# Patient Record
Sex: Female | Born: 1945
Health system: Southern US, Community
[De-identification: ages and names within clinical notes are randomized; demographics above are authoritative.]

## PROBLEM LIST (undated history)

## (undated) DIAGNOSIS — Z87442 Personal history of urinary calculi: Secondary | ICD-10-CM

## (undated) DIAGNOSIS — I471 Supraventricular tachycardia, unspecified: Secondary | ICD-10-CM

## (undated) DIAGNOSIS — R42 Dizziness and giddiness: Secondary | ICD-10-CM

## (undated) DIAGNOSIS — I499 Cardiac arrhythmia, unspecified: Secondary | ICD-10-CM

## (undated) DIAGNOSIS — I491 Atrial premature depolarization: Secondary | ICD-10-CM

## (undated) DIAGNOSIS — N1831 Chronic kidney disease, stage 3a: Secondary | ICD-10-CM

## (undated) DIAGNOSIS — R002 Palpitations: Secondary | ICD-10-CM

## (undated) DIAGNOSIS — Z923 Personal history of irradiation: Secondary | ICD-10-CM

## (undated) DIAGNOSIS — F419 Anxiety disorder, unspecified: Secondary | ICD-10-CM

## (undated) DIAGNOSIS — G4700 Insomnia, unspecified: Secondary | ICD-10-CM

## (undated) DIAGNOSIS — C50919 Malignant neoplasm of unspecified site of unspecified female breast: Secondary | ICD-10-CM

## (undated) DIAGNOSIS — N200 Calculus of kidney: Secondary | ICD-10-CM

## (undated) HISTORY — DX: Palpitations: R00.2

## (undated) HISTORY — DX: Supraventricular tachycardia, unspecified: I47.10

## (undated) HISTORY — DX: Anxiety disorder, unspecified: F41.9

## (undated) HISTORY — DX: Atrial premature depolarization: I49.1

## (undated) HISTORY — DX: Calculus of kidney: N20.0

## (undated) HISTORY — PX: EYE SURGERY: SHX253

## (undated) HISTORY — DX: Supraventricular tachycardia: I47.1

## (undated) HISTORY — PX: LITHOTRIPSY: SUR834

## (undated) HISTORY — PX: BREAST CYST ASPIRATION: SHX578

## (undated) HISTORY — DX: Insomnia, unspecified: G47.00

## (undated) HISTORY — PX: BREAST LUMPECTOMY: SHX2

## (undated) HISTORY — PX: BUNIONECTOMY: SHX129

---

## 1971-04-03 HISTORY — PX: DILATION AND CURETTAGE OF UTERUS: SHX78

## 2003-04-21 ENCOUNTER — Other Ambulatory Visit: Payer: Self-pay

## 2004-06-12 ENCOUNTER — Ambulatory Visit: Payer: Self-pay | Admitting: Unknown Physician Specialty

## 2005-06-13 ENCOUNTER — Ambulatory Visit: Payer: Self-pay | Admitting: Unknown Physician Specialty

## 2006-02-23 ENCOUNTER — Emergency Department: Payer: Self-pay | Admitting: Emergency Medicine

## 2006-07-01 ENCOUNTER — Encounter: Payer: Self-pay | Admitting: Internal Medicine

## 2006-07-02 ENCOUNTER — Ambulatory Visit: Payer: Self-pay | Admitting: Unknown Physician Specialty

## 2007-07-15 ENCOUNTER — Ambulatory Visit: Payer: Self-pay | Admitting: Unknown Physician Specialty

## 2008-07-20 ENCOUNTER — Ambulatory Visit: Payer: Self-pay | Admitting: Unknown Physician Specialty

## 2008-11-09 ENCOUNTER — Ambulatory Visit: Payer: Self-pay | Admitting: Gastroenterology

## 2008-12-16 ENCOUNTER — Encounter: Payer: Self-pay | Admitting: Internal Medicine

## 2008-12-21 ENCOUNTER — Encounter: Payer: Self-pay | Admitting: Internal Medicine

## 2009-01-24 ENCOUNTER — Ambulatory Visit: Payer: Self-pay | Admitting: Internal Medicine

## 2009-01-24 DIAGNOSIS — M8580 Other specified disorders of bone density and structure, unspecified site: Secondary | ICD-10-CM | POA: Insufficient documentation

## 2009-01-26 ENCOUNTER — Encounter: Payer: Self-pay | Admitting: Internal Medicine

## 2009-08-02 ENCOUNTER — Ambulatory Visit: Payer: Self-pay | Admitting: Unknown Physician Specialty

## 2009-08-09 ENCOUNTER — Ambulatory Visit: Payer: Self-pay | Admitting: Unknown Physician Specialty

## 2010-02-14 ENCOUNTER — Ambulatory Visit: Payer: Self-pay | Admitting: Unknown Physician Specialty

## 2010-08-08 ENCOUNTER — Ambulatory Visit: Payer: Self-pay | Admitting: Unknown Physician Specialty

## 2010-09-26 ENCOUNTER — Encounter: Payer: Self-pay | Admitting: Internal Medicine

## 2011-08-22 ENCOUNTER — Ambulatory Visit: Payer: Self-pay | Admitting: Family Medicine

## 2011-09-20 ENCOUNTER — Ambulatory Visit: Payer: Self-pay | Admitting: Family Medicine

## 2012-02-18 ENCOUNTER — Ambulatory Visit: Payer: Self-pay | Admitting: Gastroenterology

## 2012-02-18 LAB — HM COLONOSCOPY

## 2012-10-09 ENCOUNTER — Ambulatory Visit: Payer: Self-pay | Admitting: Family Medicine

## 2013-04-02 DIAGNOSIS — Z923 Personal history of irradiation: Secondary | ICD-10-CM

## 2013-04-02 HISTORY — DX: Personal history of irradiation: Z92.3

## 2013-05-23 LAB — HM PAP SMEAR

## 2013-06-29 LAB — CBC AND DIFFERENTIAL: Platelets: 235 10*3/uL (ref 150–399)

## 2013-11-13 ENCOUNTER — Ambulatory Visit: Payer: Self-pay | Admitting: Family Medicine

## 2013-11-17 ENCOUNTER — Ambulatory Visit: Payer: Self-pay | Admitting: Family Medicine

## 2013-11-20 DIAGNOSIS — D126 Benign neoplasm of colon, unspecified: Secondary | ICD-10-CM | POA: Insufficient documentation

## 2013-11-20 LAB — PATHOLOGY REPORT

## 2013-11-25 ENCOUNTER — Other Ambulatory Visit: Payer: Self-pay | Admitting: Surgery

## 2013-11-25 DIAGNOSIS — D0591 Unspecified type of carcinoma in situ of right breast: Secondary | ICD-10-CM

## 2013-12-01 DIAGNOSIS — C50919 Malignant neoplasm of unspecified site of unspecified female breast: Secondary | ICD-10-CM

## 2013-12-01 HISTORY — DX: Malignant neoplasm of unspecified site of unspecified female breast: C50.919

## 2013-12-04 ENCOUNTER — Ambulatory Visit
Admission: RE | Admit: 2013-12-04 | Discharge: 2013-12-04 | Disposition: A | Discharge status: Home / Self Care | Payer: 59 | Source: Ambulatory Visit | Attending: Surgery | Admitting: Surgery

## 2013-12-04 DIAGNOSIS — D0591 Unspecified type of carcinoma in situ of right breast: Secondary | ICD-10-CM

## 2013-12-04 MED ORDER — GADOBENATE DIMEGLUMINE 529 MG/ML IV SOLN
13.0000 mL | Freq: Once | INTRAVENOUS | Status: AC | PRN
  Administered 2013-12-04: 13 mL via INTRAVENOUS

## 2013-12-09 ENCOUNTER — Ambulatory Visit: Payer: Self-pay | Admitting: Oncology

## 2013-12-10 ENCOUNTER — Ambulatory Visit: Payer: Self-pay | Admitting: Oncology

## 2013-12-14 ENCOUNTER — Ambulatory Visit: Payer: Self-pay | Admitting: Family Medicine

## 2013-12-14 DIAGNOSIS — Z01810 Encounter for preprocedural cardiovascular examination: Secondary | ICD-10-CM

## 2013-12-15 ENCOUNTER — Ambulatory Visit: Payer: Self-pay | Admitting: Surgery

## 2013-12-15 HISTORY — PX: BREAST EXCISIONAL BIOPSY: SUR124

## 2013-12-15 HISTORY — PX: BREAST SURGERY: SHX581

## 2013-12-15 LAB — HM MAMMOGRAPHY

## 2013-12-31 ENCOUNTER — Ambulatory Visit: Payer: Self-pay | Admitting: Oncology

## 2014-01-05 LAB — PATHOLOGY REPORT

## 2014-01-21 LAB — CBC CANCER CENTER
Basophil #: 0.1 x10 3/mm (ref 0.0–0.1)
Basophil %: 1.3 %
EOS PCT: 8.2 %
Eosinophil #: 0.6 x10 3/mm (ref 0.0–0.7)
HCT: 40.1 % (ref 35.0–47.0)
HGB: 13 g/dL (ref 12.0–16.0)
LYMPHS ABS: 1.9 x10 3/mm (ref 1.0–3.6)
Lymphocyte %: 28.2 %
MCH: 31.3 pg (ref 26.0–34.0)
MCHC: 32.4 g/dL (ref 32.0–36.0)
MCV: 97 fL (ref 80–100)
MONOS PCT: 10.4 %
Monocyte #: 0.7 x10 3/mm (ref 0.2–0.9)
Neutrophil #: 3.6 x10 3/mm (ref 1.4–6.5)
Neutrophil %: 51.9 %
PLATELETS: 281 x10 3/mm (ref 150–440)
RBC: 4.15 10*6/uL (ref 3.80–5.20)
RDW: 13 % (ref 11.5–14.5)
WBC: 6.8 x10 3/mm (ref 3.6–11.0)

## 2014-01-28 LAB — CBC CANCER CENTER
Basophil #: 0.1 x10 3/mm (ref 0.0–0.1)
Basophil %: 1.2 %
Eosinophil #: 0.5 x10 3/mm (ref 0.0–0.7)
Eosinophil %: 6.8 %
HCT: 39.1 % (ref 35.0–47.0)
HGB: 13 g/dL (ref 12.0–16.0)
LYMPHS PCT: 26 %
Lymphocyte #: 1.7 x10 3/mm (ref 1.0–3.6)
MCH: 32 pg (ref 26.0–34.0)
MCHC: 33.3 g/dL (ref 32.0–36.0)
MCV: 96 fL (ref 80–100)
MONOS PCT: 12.8 %
Monocyte #: 0.9 x10 3/mm (ref 0.2–0.9)
NEUTROS ABS: 3.5 x10 3/mm (ref 1.4–6.5)
Neutrophil %: 53.2 %
Platelet: 250 x10 3/mm (ref 150–440)
RBC: 4.08 10*6/uL (ref 3.80–5.20)
RDW: 13.3 % (ref 11.5–14.5)
WBC: 6.7 x10 3/mm (ref 3.6–11.0)

## 2014-01-31 ENCOUNTER — Ambulatory Visit: Payer: Self-pay | Admitting: Oncology

## 2014-02-04 LAB — CBC CANCER CENTER
BASOS ABS: 0.1 x10 3/mm (ref 0.0–0.1)
Basophil %: 1.4 %
EOS PCT: 7.8 %
Eosinophil #: 0.6 x10 3/mm (ref 0.0–0.7)
HCT: 41 % (ref 35.0–47.0)
HGB: 13.7 g/dL (ref 12.0–16.0)
Lymphocyte #: 1.7 x10 3/mm (ref 1.0–3.6)
Lymphocyte %: 24 %
MCH: 32 pg (ref 26.0–34.0)
MCHC: 33.4 g/dL (ref 32.0–36.0)
MCV: 96 fL (ref 80–100)
MONO ABS: 0.9 x10 3/mm (ref 0.2–0.9)
MONOS PCT: 12 %
NEUTROS ABS: 4 x10 3/mm (ref 1.4–6.5)
NEUTROS PCT: 54.8 %
PLATELETS: 248 x10 3/mm (ref 150–440)
RBC: 4.28 10*6/uL (ref 3.80–5.20)
RDW: 13.3 % (ref 11.5–14.5)
WBC: 7.3 x10 3/mm (ref 3.6–11.0)

## 2014-02-11 LAB — CBC CANCER CENTER
BASOS ABS: 0 x10 3/mm (ref 0.0–0.1)
Basophil %: 0.7 %
EOS ABS: 0.6 x10 3/mm (ref 0.0–0.7)
Eosinophil %: 9 %
HCT: 38.4 % (ref 35.0–47.0)
HGB: 13 g/dL (ref 12.0–16.0)
LYMPHS PCT: 16.5 %
Lymphocyte #: 1 x10 3/mm (ref 1.0–3.6)
MCH: 31.9 pg (ref 26.0–34.0)
MCHC: 33.8 g/dL (ref 32.0–36.0)
MCV: 94 fL (ref 80–100)
Monocyte #: 0.8 x10 3/mm (ref 0.2–0.9)
Monocyte %: 12.3 %
NEUTROS ABS: 3.8 x10 3/mm (ref 1.4–6.5)
Neutrophil %: 61.5 %
PLATELETS: 217 x10 3/mm (ref 150–440)
RBC: 4.06 10*6/uL (ref 3.80–5.20)
RDW: 12.9 % (ref 11.5–14.5)
WBC: 6.2 x10 3/mm (ref 3.6–11.0)

## 2014-02-18 LAB — CBC CANCER CENTER
BASOS PCT: 0.2 %
Basophil #: 0 x10 3/mm (ref 0.0–0.1)
Eosinophil #: 0.6 x10 3/mm (ref 0.0–0.7)
Eosinophil %: 10.1 %
HCT: 34.3 % — ABNORMAL LOW (ref 35.0–47.0)
HGB: 11.2 g/dL — ABNORMAL LOW (ref 12.0–16.0)
LYMPHS PCT: 11.8 %
Lymphocyte #: 0.7 x10 3/mm — ABNORMAL LOW (ref 1.0–3.6)
MCH: 31.2 pg (ref 26.0–34.0)
MCHC: 32.7 g/dL (ref 32.0–36.0)
MCV: 96 fL (ref 80–100)
MONOS PCT: 7.7 %
Monocyte #: 0.5 x10 3/mm (ref 0.2–0.9)
Neutrophil #: 4.2 x10 3/mm (ref 1.4–6.5)
Neutrophil %: 70.2 %
PLATELETS: 222 x10 3/mm (ref 150–440)
RBC: 3.59 10*6/uL — AB (ref 3.80–5.20)
RDW: 13.8 % (ref 11.5–14.5)
WBC: 6 x10 3/mm (ref 3.6–11.0)

## 2014-03-02 ENCOUNTER — Ambulatory Visit: Payer: Self-pay | Admitting: Oncology

## 2014-03-24 LAB — TSH: TSH: 0.87 u[IU]/mL (ref <=5.90)

## 2014-04-02 ENCOUNTER — Ambulatory Visit: Payer: Self-pay | Admitting: Oncology

## 2014-04-14 LAB — CBC CANCER CENTER
BASOS ABS: 0.1 x10 3/mm (ref 0.0–0.1)
Basophil %: 1 %
Eosinophil #: 0.5 x10 3/mm (ref 0.0–0.7)
Eosinophil %: 6.6 %
HCT: 40.4 % (ref 35.0–47.0)
HGB: 13.5 g/dL (ref 12.0–16.0)
LYMPHS ABS: 1.4 x10 3/mm (ref 1.0–3.6)
Lymphocyte %: 20.7 %
MCH: 31.4 pg (ref 26.0–34.0)
MCHC: 33.4 g/dL (ref 32.0–36.0)
MCV: 94 fL (ref 80–100)
MONO ABS: 0.6 x10 3/mm (ref 0.2–0.9)
Monocyte %: 8.9 %
NEUTROS ABS: 4.3 x10 3/mm (ref 1.4–6.5)
Neutrophil %: 62.8 %
Platelet: 241 x10 3/mm (ref 150–440)
RBC: 4.29 10*6/uL (ref 3.80–5.20)
RDW: 13.8 % (ref 11.5–14.5)
WBC: 6.9 x10 3/mm (ref 3.6–11.0)

## 2014-04-14 LAB — COMPREHENSIVE METABOLIC PANEL
ALT: 29 U/L
Albumin: 3.7 g/dL (ref 3.4–5.0)
Alkaline Phosphatase: 73 U/L
Anion Gap: 5 — ABNORMAL LOW (ref 7–16)
BILIRUBIN TOTAL: 0.4 mg/dL (ref 0.2–1.0)
BUN: 14 mg/dL (ref 7–18)
CALCIUM: 8.9 mg/dL (ref 8.5–10.1)
CHLORIDE: 105 mmol/L (ref 98–107)
CO2: 30 mmol/L (ref 21–32)
Creatinine: 0.89 mg/dL (ref 0.60–1.30)
Glucose: 98 mg/dL (ref 65–99)
Osmolality: 280 (ref 275–301)
Potassium: 3.7 mmol/L (ref 3.5–5.1)
SGOT(AST): 27 U/L (ref 15–37)
Sodium: 140 mmol/L (ref 136–145)
Total Protein: 7.4 g/dL (ref 6.4–8.2)

## 2014-05-03 ENCOUNTER — Ambulatory Visit: Payer: Self-pay | Admitting: Oncology

## 2014-05-04 ENCOUNTER — Ambulatory Visit: Payer: Self-pay | Admitting: Family Medicine

## 2014-05-04 LAB — HM DEXA SCAN

## 2014-06-30 LAB — BASIC METABOLIC PANEL
BUN: 4 mg/dL (ref 4–21)
CREATININE: 0.9 mg/dL (ref <=1.1)
Glucose: 102 mg/dL
Potassium: 4.3 mmol/L (ref 3.4–5.3)
SODIUM: 140 mmol/L (ref 137–147)

## 2014-06-30 LAB — CBC AND DIFFERENTIAL
HEMATOCRIT: 41 % (ref 36–46)
HEMOGLOBIN: 13.5 g/dL (ref 12.0–16.0)
WBC: 5.5 10^3/mL

## 2014-06-30 LAB — HEPATIC FUNCTION PANEL
ALT: 13 U/L (ref 7–35)
AST: 14 U/L (ref 13–35)

## 2014-06-30 LAB — LIPID PANEL
CHOLESTEROL: 222 mg/dL — AB (ref 0–200)
HDL: 55 mg/dL (ref 35–70)
LDL Cholesterol: 132 mg/dL
TRIGLYCERIDES: 173 mg/dL — AB (ref 40–160)

## 2014-07-16 ENCOUNTER — Other Ambulatory Visit: Payer: Self-pay | Admitting: Oncology

## 2014-07-16 DIAGNOSIS — Z853 Personal history of malignant neoplasm of breast: Secondary | ICD-10-CM

## 2014-07-24 NOTE — Consult Note (Signed)
Reason for Visit: This 69 year old Female patient presents to the clinic for initial evaluation of  breast cancer .   Referred by Dr. Tamala Julian.  Diagnosis:  Chief Complaint/Diagnosis   69 year old female with right breast DCIS stage 0 (Tis N0 M0) ER/PR status pending for adjuvant whole breast radiation.  Pathology Report pathology report reviewed   Imaging Report mammograms ultrasound reviewed   Referral Report clinical notes reviewed   Planned Treatment Regimen whole breast radiation   HPI   patient is a pleasant 69 year old female presents with an abnormal mammogram an ultrasoundof the right breast showing an irregular hypoechoic mass with indistinct margins at 12:00 position 4 centimeters from the nipple areole or complex. Ultrasound-guided needle localization was performed positive for ductal carcinoma in situ. She had an MRI of the pressure and no additional lesions. She underwent a wide local excision for a 2.4 cm grade 2 overall ductal carcinoma in situ margins were clear but close at 1 mm. There is apocrine and sclerosing adenosis present. ER/PR status is pending. She's been seen by medical oncology and is now referred to radiation oncology for consideration of treatment. She is doing well she specifically denies breast tenderness cough or bone pain.she did have a sentinel node biopsy which was negative.  Past Hx:    Anxiety:    kidney stone removal:   Past, Family and Social History:  Past Medical History positive   Cardiovascular supraventricular tachycardia   Genitourinary kidney stones   Neurological/Psychiatric anxiety   Past Medical History Comments osteoporosis   Family History positive   Family History Comments mother sister had breast cancer old age mother with ovarian cancer   Social History noncontributory   Additional Past Medical and Surgical History accompanied by her husband today   Allergies:   No Known Allergies:   Home Meds:  Home  Medications: Medication Instructions Status  atenolol 25 mg oral tablet 1 tab(s) orally once a day Active  Xanax 0.5 mg oral tablet 2 tab(s) orally once a day (at bedtime), As Needed - for Inability to Sleep Active  Vitamin D3 1000 intl units oral capsule 1 cap(s) orally once a day Active  biotin 10000 microgram(s) orally once a day Active  Calcium 600+D 600 mg-800 intl units oral tablet 1 tab(s) orally once a day Active  traZODone 100 mg oral tablet 1 tab(s) orally once a day (at bedtime) Active   Review of Systems:  General negative   Performance Status (ECOG) 0   Skin negative   Breast see HPI   Ophthalmologic negative   ENMT negative   Respiratory and Thorax negative   Cardiovascular negative   Gastrointestinal negative   Genitourinary negative   Musculoskeletal negative   Neurological negative   Psychiatric negative   Hematology/Lymphatics negative   Endocrine negative   Allergic/Immunologic negative   Review of Systems   denies any weight loss, fatigue, weakness, fever, chills or night sweats. Patient denies any loss of vision, blurred vision. Patient denies any ringing  of the ears or hearing loss. No irregular heartbeat. Patient denies heart murmur or history of fainting. Patient denies any chest pain or pain radiating to her upper extremities. Patient denies any shortness of breath, difficulty breathing at night, cough or hemoptysis. Patient denies any swelling in the lower legs. Patient denies any nausea vomiting, vomiting of blood, or coffee ground material in the vomitus. Patient denies any stomach pain. Patient states has had normal bowel movements no significant constipation or diarrhea. Patient denies  any dysuria, hematuria or significant nocturia. Patient denies any problems walking, swelling in the joints or loss of balance. Patient denies any skin changes, loss of hair or loss of weight. Patient denies any excessive worrying or anxiety or significant  depression. Patient denies any problems with insomnia. Patient denies excessive thirst, polyuria, polydipsia. Patient denies any swollen glands, patient denies easy bruising or easy bleeding. Patient denies any recent infections, allergies or URI. Patient "s visual fields have not changed significantly in recent time.   Nursing Notes:  Nursing Vital Signs and Chemo Nursing Nursing Notes: *CC Vital Signs Flowsheet:   05-Oct-15 10:54  Temp Temperature 97.2  Pulse Pulse 56  Respirations Respirations 20  SBP SBP 119  DBP DBP 62  Pain Scale (0-10)  0  Current Weight (kg) (kg) 68.3  Height (cm) centimeters 168  BSA (m2) 1.7   Physical Exam:  General/Skin/HEENT:  General normal   Skin normal   Eyes normal   ENMT normal   Head and Neck normal   Additional PE a well-developed female in NAD lungs are clear to A&P cardiac examination shows regular rate and rhythm. She status post wide local excision of the right breast which is healing well. No dominant mass or nodularity is noted in either breast in 2 positions examined. No axillary or supraclavicular adenopathy is appreciated. She also is a small sentinel nodes incision which is also healed well.   Other Results:  Radiology Results: LabUnknown:    18-Aug-15 09:10, Digital Additional Views Rt Breast (SCR)  PACS Image   [facility]:  Digital Additional Views Rt Breast (SCR)   REASON FOR EXAM:    RT BREAST AV FOR DISTORTION  COMMENTS:       PROCEDURE: MAM - MAM DIG ADDVIEWS RT SCR  - Nov 17 2013  9:10AM     CLINICAL DATA:  Callback from screening mammogram for possible  distortion within the right breast    EXAM:  DIGITAL DIAGNOSTIC  RIGHT MAMMOGRAM WITH CAD    ULTRASOUND RIGHT BREAST    COMPARISON:  11/13/2013, 10/09/2012, 09/20/2011, 08/08/2010,  02/14/2010, 08/09/2009, 08/02/2009, 07/20/2008, 07/15/2007,  07/02/2006, 06/13/2005, 06/12/2004    ACR Breast Density Category c: The breast tissue is heterogeneously  dense,  which may obscure small masses.    FINDINGS:  The questioned architectural distortion seen on the right MLO view  at screening mammogram persists on additional spot-compression  imaging.    Mammographic images were processed with CAD.    Ultrasound is performed, showing an irregular, hypoechoic mass with  indistinct margins at 12 o'clock, 4 cm from the nipple measuring 2.1  x 1.2 x 1.3 cm. No suspicious lymph nodes are seen within the right  axilla.     IMPRESSION:  Suspicious right breast mass.    RECOMMENDATION:  Ultrasound-guided core biopsy of the right breast    I have discussed the findings and recommendations with the patient.  Results were also provided in writing at the conclusion of the  visit. If applicable, a reminder letter will be sent to the patient  regarding the next appointment.    BI-RADS CATEGORY  4: Suspicious.  Electronically Signed    By: Pamelia Hoit M.D.    On: 11/17/2013 11:00         Verified By: Ples Specter, M.D.,   Relevent Results:   Relevant Scans and Labs mammograms and ultrasound reviewed   Assessment and Plan: Impression:   stage 0 ductal carcinoma in situ in 69 year old female  status post wide local excision and sentinel node biopsy for whole breast radiation. Plan:   at this time I recommend whole breast radiation therapy to 5000 cGy boost to her scar another 1600 cGy electron beam based on the close margin. Risks and benefits of treatment including skin reactions, fatigue, alteration of blood counts, inclusion of some superficial lungs all were explained in detail to the patient. She seems to comprehend my treatment plan well. I have ordered and set up for CT simulation later this week.we'll try to locate her ER/PR status and a medical oncology we'll make that determination as far as tamoxifen therapy is concerned.  I would like to take this opportunity for allowing me to participate in the care of your patient..  Fax to Physician:   Physicians To Recieve Fax: Jerrell Belfast, MD - [phone] Rochel Brome, MD - [phone].  Electronic Signatures: Armstead Peaks (MD)  (Signed 05-Oct-15 15:26)  Authored: HPI, Diagnosis, Past Hx, PFSH, Allergies, Home Meds, ROS, Nursing Notes, Physical Exam, Other Results, Relevent Results, Encounter Assessment and Plan, Fax to Physician   Last Updated: 05-Oct-15 15:26 by Armstead Peaks (MD)

## 2014-07-24 NOTE — Op Note (Signed)
PATIENT NAME:  Claudia Little, Claudia Little MR#:  [id number] DATE OF BIRTH:  1946-01-11  DATE OF PROCEDURE:  12/15/2013  PREOPERATIVE DIAGNOSIS: Ductal carcinoma in situ of the right breast.   POSTOPERATIVE DIAGNOSIS:  Ductal carcinoma in situ of the right breast.   PROCEDURE: Right partial mastectomy with axillary sentinel lymph node biopsy.   SURGEON: Rochel Brome, MD.   ANESTHESIA: General.   INDICATIONS: This 69 year old female recently had a mammogram depicting abnormality in the upper aspect right breast. Ultrasound demonstrated a 2.1 cm mass. Ultrasound guided core needle biopsy demonstrated ductal carcinoma in situ. Surgery was recommended for definitive treatment, also sentinel lymph node biopsy was recommended due to the large size of the mass and the location in the breast.   The patient did have preoperative x-ray needle localization with the wire entering the upper inner aspect of the breast and extending towards the 12 o'clock position with the mass approximately 4 cm above the nipple. She also had preoperative injection of radioactive technetium sulfur colloid.   DESCRIPTION OF PROCEDURE: The patient was placed on the operating table in the supine position under general anesthesia. The dressing was removed from the right breast exposing the Kopans wire which was cut 2 cm from the skin. The right arm was placed on a lateral arm support. The right breast and surrounding chest wall were prepared with ChloraPrep and draped in a sterile manner.   A transversely oriented curvilinear incision was made from approximately the 10:30 to 1:30 position some 4 cm from the nipple. Did remove an ellipse of skin which was approximately 1 cm in width and dissected down through subcutaneous tissues. Several small bleeding points were cauterized. The wire was encountered. There was a palpable mass deep within the upper aspect of the right breast which palpation helped to direct the extent of the dissection. The mass  was excised intact with the overlying skin ellipse and the Kopans wire was left in place. The barb was at the lateral end of the skin ellipse. Markers were sutured to mark the cranial, caudal, medial, and deep margins. A stitch was placed at the 10:30 end of the skin ellipse for the pathologist's orientation.   The specimen was submitted for specimen mammogram and pathology.   Next attention was turned to the axilla where the gamma counter was used to demonstrate an area of radioactivity in the inferior aspect of the axilla. An obliquely oriented 4 cm incision was made in the inferior aspect of the axilla, carried down through subcutaneous tissues. There was 1  traversing artery which was divided and suture ligated with 4-0 Chromic. Dissection was carried down through superficial fascia down deeply adjacent to the rib cage where a lymph node was encountered with radioactivity. This was dissected free from surrounding structures along with some surrounding fatty tissue. The lymph node was approximately 5-6 mm in dimension. The ex vivo count was in the range of 140-160, background count was less than 20. There was no remaining palpable mass in the axilla and hemostasis was intact. The subcutaneous tissues were infiltrated with 0.5% Sensorcaine with epinephrine, also subcutaneous tissues were approximated with 4-0 Chromic. The skin was closed with running 4-0 Monocryl subcuticular suture.   The radiologist did call back to indicate that the biopsy marker was seen in the specimen. The pathologist called to indicate that the margins appeared to be satisfactory.   The wound was inspected. Hemostasis was intact. The subcutaneous tissues were approximated with interrupted 4-0 Chromic. The skin was  closed with running 4-0 Monocryl subcuticular suture. Both wounds were treated with Dermabond. The patient tolerated surgery satisfactorily and was then prepared for transfer to the recovery room.     ____________________________ Lenna Sciara. Rochel Brome, MD jws:bu D: 12/15/2013 16:11:04 ET T: 12/15/2013 16:31:57 ET JOB#: [id number]  cc: Loreli Dollar, MD, <Dictator> Loreli Dollar MD ELECTRONICALLY SIGNED 12/15/2013 19:45

## 2014-07-26 DIAGNOSIS — G4700 Insomnia, unspecified: Secondary | ICD-10-CM | POA: Insufficient documentation

## 2014-07-26 DIAGNOSIS — I10 Essential (primary) hypertension: Secondary | ICD-10-CM | POA: Insufficient documentation

## 2014-07-26 DIAGNOSIS — R748 Abnormal levels of other serum enzymes: Secondary | ICD-10-CM | POA: Insufficient documentation

## 2014-07-26 DIAGNOSIS — E7800 Pure hypercholesterolemia, unspecified: Secondary | ICD-10-CM | POA: Insufficient documentation

## 2014-07-26 DIAGNOSIS — I471 Supraventricular tachycardia, unspecified: Secondary | ICD-10-CM | POA: Insufficient documentation

## 2014-07-26 DIAGNOSIS — Z860100 Personal history of colon polyps, unspecified: Secondary | ICD-10-CM | POA: Insufficient documentation

## 2014-07-26 DIAGNOSIS — Z853 Personal history of malignant neoplasm of breast: Secondary | ICD-10-CM | POA: Insufficient documentation

## 2014-07-26 DIAGNOSIS — E781 Pure hyperglyceridemia: Secondary | ICD-10-CM | POA: Insufficient documentation

## 2014-07-26 DIAGNOSIS — F419 Anxiety disorder, unspecified: Secondary | ICD-10-CM | POA: Insufficient documentation

## 2014-07-26 DIAGNOSIS — R922 Inconclusive mammogram: Secondary | ICD-10-CM | POA: Insufficient documentation

## 2014-07-26 DIAGNOSIS — E785 Hyperlipidemia, unspecified: Secondary | ICD-10-CM | POA: Insufficient documentation

## 2014-07-26 DIAGNOSIS — R0602 Shortness of breath: Secondary | ICD-10-CM | POA: Insufficient documentation

## 2014-09-13 ENCOUNTER — Encounter: Payer: Self-pay | Admitting: Family Medicine

## 2014-09-13 ENCOUNTER — Ambulatory Visit (INDEPENDENT_AMBULATORY_CARE_PROVIDER_SITE_OTHER): Payer: Medicare Other | Admitting: Family Medicine

## 2014-09-13 ENCOUNTER — Ambulatory Visit: Payer: Medicare Other | Admitting: Radiation Oncology

## 2014-09-13 VITALS — BP 120/82 | HR 60 | Temp 97.3°F | Resp 16 | Ht 66.0 in | Wt 143.0 lb

## 2014-09-13 DIAGNOSIS — E781 Pure hyperglyceridemia: Secondary | ICD-10-CM | POA: Diagnosis not present

## 2014-09-13 DIAGNOSIS — E7800 Pure hypercholesterolemia, unspecified: Secondary | ICD-10-CM

## 2014-09-13 DIAGNOSIS — G4700 Insomnia, unspecified: Secondary | ICD-10-CM

## 2014-09-13 DIAGNOSIS — I471 Supraventricular tachycardia: Secondary | ICD-10-CM | POA: Diagnosis not present

## 2014-09-13 DIAGNOSIS — F419 Anxiety disorder, unspecified: Secondary | ICD-10-CM | POA: Diagnosis not present

## 2014-09-13 MED ORDER — ZOLPIDEM TARTRATE 10 MG PO TABS: 5.0000 mg | ORAL_TABLET | Freq: Every day | ORAL | Status: DC

## 2014-09-13 MED ORDER — ALPRAZOLAM 0.5 MG PO TABS: 0.5000 mg | ORAL_TABLET | Freq: Every day | ORAL | Status: DC

## 2014-09-13 NOTE — Progress Notes (Signed)
Subjective:     Patient ID: Claudia Little, female   DOB: 1946-02-16, 69 y.o.   MRN: [id number]  Hyperlipidemia This is a chronic problem. The problem is controlled. Recent lipid tests were reviewed and are variable (Last Cholesterol check was 06/30/2014  Total Cholestrol 222; Tri: 173; HDL 44; LDL 132.). There are no known factors aggravating her hyperlipidemia. Pertinent negatives include no chest pain, leg pain, myalgias or shortness of breath. Current antihyperlipidemic treatment includes diet change and exercise. There are no compliance problems.   Anxiety Presents for follow-up visit. Symptoms include insomnia, nervous/anxious behavior and palpitations (History of SVT, pt reports it being under good control.). Patient reports no chest pain, dizziness, nausea, panic, shortness of breath or suicidal ideas. Symptoms occur occasionally. The quality of sleep is fair (Pt reports sleeping better on Ambein.).   The treatment provided significant relief. Compliance with prior treatments has been good (Pt only needed Xanax as needed.).     Patient Active Problem List   Diagnosis Date Noted  . Anxiety 07/26/2014  . Breast CA 07/26/2014  . Inconclusive mammogram 07/26/2014  . Abnormal liver enzymes 07/26/2014  . History of colon polyps 07/26/2014  . Hypercholesteremia 07/26/2014  . HLD (hyperlipidemia) 07/26/2014  . Hypertriglyceridemia 07/26/2014  . Cannot sleep 07/26/2014  . Breath shortness 07/26/2014  . Supraventricular tachycardia 07/26/2014  . Benign neoplasm of large bowel 11/20/2013  . OSTEOPENIA 01/24/2009   Family History  Problem Relation Age of Onset  . Aortic stenosis Mother   . Congestive Heart Failure Mother   . Aortic stenosis Father   . Coronary artery disease Father     Deceased from complications of atherosclerotic CAD and Atherosclerosis  . Congestive Heart Failure Father   . Parkinsonism Brother   . Colon polyps Sister   . Stroke Maternal Grandmother   . Arthritis  Maternal Grandfather   . Early death Paternal Grandmother   . Kidney disease Paternal Grandfather   . Breast cancer Maternal Aunt   . Ovarian cancer Maternal Aunt    History   Social History  . Marital Status: Married    Spouse Name: Richard  . Number of Children: 1  . Years of Education: HS   Occupational History  . Retired    Social History Main Topics  . Smoking status: Never Smoker   . Smokeless tobacco: Former Systems developer  . Alcohol Use: 4.2 oz/week    7 Glasses of wine per week  . Drug Use: No  . Sexual Activity: Not on file   Other Topics Concern  . Not on file   Social History Narrative   Past Surgical History  Procedure Laterality Date  . Lithotripsy    . Bunionectomy  early '70s  . Dilation and curettage of uterus  1973    after miscarriage  . Breast surgery Right 12/15/2013    lumpectomy   Allergies  Allergen Reactions  . Oxycodone Nausea And Vomiting   Current Outpatient Prescriptions on File Prior to Visit  Medication Sig Dispense Refill  . ALPRAZolam (XANAX) 0.5 MG tablet Take by mouth.    Marland Kitchen atenolol (TENORMIN) 25 MG tablet Take by mouth.    . Biotin 10 MG TABS Take by mouth.    . Calcium Carbonate-Vit D-Min (RA CALCIUM 600/VIT D/MINERALS) 600-200 MG-UNIT TABS Take 1 tablet by mouth 2 (two) times daily.      . Cholecalciferol 1000 UNITS capsule Take by mouth.     No current facility-administered medications on file prior to visit.  BP 120/82 mmHg  Pulse 60  Temp(Src) 97.3 F (36.3 C) (Oral)  Resp 16  Ht 5\' 6"  (1.676 m)  Wt 143 lb (64.864 kg)  BMI 23.09 kg/m2    Review of Systems  Constitutional: Negative for fever, chills, diaphoresis, activity change, appetite change, fatigue and unexpected weight change.  Respiratory: Positive for cough. Negative for apnea, choking, chest tightness, shortness of breath, wheezing and stridor.   Cardiovascular: Positive for palpitations (History of SVT, pt reports it being under good control.). Negative for  chest pain.  Gastrointestinal: Negative for nausea, vomiting, abdominal pain, diarrhea, constipation, blood in stool, abdominal distention, anal bleeding and rectal pain.  Musculoskeletal: Negative for myalgias, back pain, joint swelling, arthralgias, gait problem, neck pain and neck stiffness.  Neurological: Negative for dizziness, facial asymmetry, speech difficulty and headaches.  Psychiatric/Behavioral: Negative for suicidal ideas, behavioral problems and agitation. The patient is nervous/anxious and has insomnia.        Objective:   Physical Exam  Constitutional: She is oriented to person, place, and time. She appears well-developed and well-nourished.  Neurological: She is alert and oriented to person, place, and time.  Psychiatric: She has a normal mood and affect. Her behavior is normal. Judgment and thought content normal.   BP 120/82 mmHg  Pulse 60  Temp(Src) 97.3 F (36.3 C) (Oral)  Resp 16  Ht 5\' 6"  (1.676 m)  Wt 143 lb (64.864 kg)  BMI 23.09 kg/m2      Assessment:     Stable. Healthy. Continue current meds and plan of care as below. Also reviewed supplements at visit.     Plan:     1. Cannot sleep Refilled Ambien today.  Does not have any side effects to meds. Understands should not take long term but want to continue. Other medications have not worked. Still does not sleep through the night.  - zolpidem (AMBIEN) 10 MG tablet; Take 0.5 tablets (5 mg total) by mouth at bedtime.  Dispense: 30 tablet; Refill: 5  2. Hypercholesteremia Reviewed today. Will 10 year risk of heart disease at 6.7 will work on lifestyle.   3. Hypertriglyceridemia As above  4. Supraventricular tachycardia Stable, continue Atenolol   5. Anxiety Condition is stable. Please continue current medication and  plan of care as noted.   - ALPRAZolam (XANAX) 0.5 MG tablet; Take 1 tablet (0.5 mg total) by mouth daily. As needed for Anxiety  Dispense: 30 tablet; Refill: 5   Margarita Rana, MD

## 2014-09-14 ENCOUNTER — Encounter: Payer: Self-pay | Admitting: Radiation Oncology

## 2014-09-14 ENCOUNTER — Ambulatory Visit
Admission: RE | Admit: 2014-09-14 | Discharge: 2014-09-14 | Disposition: A | Discharge status: Home / Self Care | Payer: Medicare Other | Source: Ambulatory Visit | Attending: Radiation Oncology | Admitting: Radiation Oncology

## 2014-09-14 ENCOUNTER — Encounter (INDEPENDENT_AMBULATORY_CARE_PROVIDER_SITE_OTHER): Payer: Self-pay

## 2014-09-14 VITALS — BP 109/65 | HR 60 | Temp 96.9°F | Resp 18 | Ht 66.0 in | Wt 141.5 lb

## 2014-09-14 DIAGNOSIS — C50911 Malignant neoplasm of unspecified site of right female breast: Secondary | ICD-10-CM

## 2014-09-14 NOTE — Progress Notes (Signed)
Radiation Oncology Follow up Note  Name: Claudia Little   Date:   09/14/2014 MRN:  [id number] DOB: 10-Aug-1945    This 69 y.o. female presents to the clinic today for follow-up for ductal carcinoma in situ.  REFERRING PROVIDER: Margarita Rana, MD  HPI: Patient is a 69 year old female now out approximately 8 months having completed radiation therapy to her right breast for ductal carcinoma in situ ER/PR negative. She seen today in routine follow-up and is doing well. She specifically denies breast tenderness cough or bone pain. She is had follow-up mammograms which have been fine. She is not on tamoxifen based on the negative ER/PR nature of her disease.  COMPLICATIONS OF TREATMENT: none  FOLLOW UP COMPLIANCE: keeps appointments   PHYSICAL EXAM:  BP 109/65 mmHg  Pulse 60  Temp(Src) 96.9 F (36.1 C)  Resp 18  Ht 5\' 6"  (1.676 m)  Wt 141 lb 8.6 oz (64.2 kg)  BMI 22.86 kg/m2 Lungs are clear to A&P cardiac examination essentially unremarkable with regular rate and rhythm. No dominant mass or nodularity is noted in either breast in 2 positions examined. Incision is well-healed. No axillary or supraclavicular adenopathy is appreciated. Cosmetic result is excellent. Well-developed well-nourished patient in NAD. HEENT reveals PERLA, EOMI, discs not visualized.  Oral cavity is clear. No oral mucosal lesions are identified. Neck is clear without evidence of cervical or supraclavicular adenopathy. Lungs are clear to A&P. Cardiac examination is essentially unremarkable with regular rate and rhythm without murmur rub or thrill. Abdomen is benign with no organomegaly or masses noted. Motor sensory and DTR levels are equal and symmetric in the upper and lower extremities. Cranial nerves II through XII are grossly intact. Proprioception is intact. No peripheral adenopathy or edema is identified. No motor or sensory levels are noted. Crude visual fields are within normal range.   RADIOLOGY RESULTS:  Mammograms are reviewed  PLAN: At the present time she continues to do well with no evidence of disease. I'm please were overall progress. I've asked to see her out in 1 year for follow-up. She continues follow-up care with medical oncology as well as surgeon. Patient knows to call with any concerns.    Armstead Peaks., MD

## 2014-11-19 ENCOUNTER — Ambulatory Visit: Payer: Self-pay

## 2014-11-19 ENCOUNTER — Ambulatory Visit
Admission: RE | Admit: 2014-11-19 | Discharge: 2014-11-19 | Disposition: A | Discharge status: Home / Self Care | Payer: Medicare Other | Source: Ambulatory Visit | Attending: Oncology | Admitting: Oncology

## 2014-11-19 DIAGNOSIS — Z853 Personal history of malignant neoplasm of breast: Secondary | ICD-10-CM | POA: Diagnosis present

## 2014-11-19 HISTORY — DX: Malignant neoplasm of unspecified site of unspecified female breast: C50.919

## 2014-11-25 ENCOUNTER — Ambulatory Visit: Payer: Medicare Other | Admitting: Oncology

## 2014-12-02 ENCOUNTER — Inpatient Hospital Stay: Payer: Medicare Other | Attending: Oncology | Admitting: Oncology

## 2014-12-02 VITALS — BP 113/55 | HR 55 | Temp 95.9°F | Wt 144.4 lb

## 2014-12-02 DIAGNOSIS — D0511 Intraductal carcinoma in situ of right breast: Secondary | ICD-10-CM

## 2014-12-02 DIAGNOSIS — Z171 Estrogen receptor negative status [ER-]: Secondary | ICD-10-CM | POA: Diagnosis not present

## 2014-12-02 DIAGNOSIS — Z923 Personal history of irradiation: Secondary | ICD-10-CM | POA: Diagnosis not present

## 2014-12-02 DIAGNOSIS — C50919 Malignant neoplasm of unspecified site of unspecified female breast: Secondary | ICD-10-CM

## 2014-12-02 DIAGNOSIS — Z79899 Other long term (current) drug therapy: Secondary | ICD-10-CM

## 2014-12-02 NOTE — Progress Notes (Signed)
Patient does have living will. Never smoked. 

## 2014-12-06 ENCOUNTER — Encounter: Payer: Self-pay | Admitting: Oncology

## 2014-12-06 NOTE — Progress Notes (Signed)
[place] @ [facility] [phone]  Fax:([phone]) [place] OB: 17-Jun-1945  MR#: [id number]  OIZ#:[id number]  Patient Care Team: Margarita Rana, MD as PCP - General (Family Medicine)  CHIEF COMPLAINT:  Chief Complaint  Patient presents with  . Follow-up  69 year old female with right breast DCIS stage 0 (Tis N0 M0)  Status post lumpectomy and radiation therapy Estrogen receptor negative.  Progesterone receptor negative. Patient has finished radiation therapy on December 8   INTERVAL HISTORY: 69 year old lady with a history of carcinoma of breast stage 0 status post lumpectomy and radiation therapy.  Patient had estrogen receptor progesterone receptor negative tumor Came here for further follow-up had a recent mammogram done at number of questions regarding further planning of treatment  REVIEW OF SYSTEMS:   GENERAL:  Feels good.  Active.  No fevers, sweats or weight loss. PERFORMANCE STATUS (ECOG):  0 HEENT:  No visual changes, runny nose, sore throat, mouth sores or tenderness. Lungs: No shortness of breath or cough.  No hemoptysis. Cardiac:  No chest pain, palpitations, orthopnea, or PND. GI:  No nausea, vomiting, diarrhea, constipation, melena or hematochezia. GU:  No urgency, frequency, dysuria, or hematuria. Musculoskeletal:  No back pain.  No joint pain.  No muscle tenderness. Extremities:  No pain or swelling. Skin:  No rashes or skin changes. Neuro:  No headache, numbness or weakness, balance or coordination issues. Endocrine:  No diabetes, thyroid issues, hot flashes or night sweats. Psych:  No mood changes, depression or anxiety. Pain:  No focal pain. Review of systems:  All other systems reviewed and found to be negative. As per HPI. Otherwise, a complete review of systems is negatve.  PAST MEDICAL HISTORY: Past Medical History  Diagnosis Date  . Insomnia   . Nephrolithiasis   . Chronic anxiety   . Palpitations   . SVT  (supraventricular tachycardia)   . PAC (premature atrial contraction)   . Osteoporosis   . Breast cancer 12/2013    radiation    PAST SURGICAL HISTORY: Past Surgical History  Procedure Laterality Date  . Lithotripsy    . Bunionectomy  early '70s  . Dilation and curettage of uterus  1973    after miscarriage  . Breast surgery Right 12/15/2013    lumpectomy  . Breast excisional biopsy Right 12/15/2013    positive  . Breast cyst aspiration Left 10+ yrs ago    neg    FAMILY HISTORY Family History  Problem Relation Age of Onset  . Aortic stenosis Mother   . Congestive Heart Failure Mother   . Aortic stenosis Father   . Coronary artery disease Father     Deceased from complications of atherosclerotic CAD and Atherosclerosis  . Congestive Heart Failure Father   . Parkinsonism Brother   . Colon polyps Sister   . Stroke Maternal Grandmother   . Arthritis Maternal Grandfather   . Early death Paternal Grandmother   . Kidney disease Paternal Grandfather   . Breast cancer Maternal Aunt 70  . Ovarian cancer Maternal Aunt     ADVANCED DIRECTIVES:  Patient does not have any living will or healthcare power of attorney.  Information was given .  Available resources had been discussed.  We will follow-up on subsequent appointments regarding this issue HEALTH MAINTENANCE: Social History  Substance Use Topics  . Smoking status: Never Smoker   . Smokeless tobacco: Former Systems developer  . Alcohol Use: 4.2 oz/week    7 Glasses of wine per week  Allergies  Allergen Reactions  . Oxycodone Nausea And Vomiting    Current Outpatient Prescriptions  Medication Sig Dispense Refill  . ALPRAZolam (XANAX) 0.5 MG tablet Take 1 tablet (0.5 mg total) by mouth daily. As needed for Anxiety 30 tablet 5  . atenolol (TENORMIN) 25 MG tablet Take by mouth.    . Biotin 10 MG TABS Take by mouth.    . Calcium Carbonate-Vit D-Min (RA CALCIUM 600/VIT D/MINERALS) 600-200 MG-UNIT TABS Take 1 tablet by mouth 2  (two) times daily.      . Cholecalciferol 1000 UNITS capsule Take by mouth.    . zolpidem (AMBIEN) 10 MG tablet Take 0.5 tablets (5 mg total) by mouth at bedtime. 30 tablet 5   No current facility-administered medications for this visit.    OBJECTIVE:  Filed Vitals:   12/02/14 1544  BP: 113/55  Pulse: 55  Temp: 95.9 F (35.5 C)     Body mass index is 23.31 kg/(m^2).    ECOG FS:0 - Asymptomatic  PHYSICAL EXAM: GENERAL:  Well developed, well nourished, sitting comfortably in the exam room in no acute distress. MENTAL STATUS:  Alert and oriented to person, place and time.  ENT:  Oropharynx clear without lesion.  Tongue normal. Mucous membranes moist.  RESPIRATORY:  Clear to auscultation without rales, wheezes or rhonchi. CARDIOVASCULAR:  Regular rate and rhythm without murmur, rub or gallop. BREAST:  Right breast without masses, skin changes or nipple discharge.  Left breast without masses, skin changes or nipple discharge. ABDOMEN:  Soft, non-tender, with active bowel sounds, and no hepatosplenomegaly.  No masses. BACK:  No CVA tenderness.  No tenderness on percussion of the back or rib cage. SKIN:  No rashes, ulcers or lesions. EXTREMITIES: No edema, no skin discoloration or tenderness.  No palpable cords. LYMPH NODES: No palpable cervical, supraclavicular, axillary or inguinal adenopathy  NEUROLOGICAL: Unremarkable. PSYCH:  Appropriate.   LAB RESULTS:  CBC Latest Ref Rng 06/30/2014 04/14/2014  WBC - 5.5 6.9  Hemoglobin 12.0 - 16.0 g/dL 13.5 13.5  Hematocrit 36 - 46 % 41 40.4  Platelets 150-440 x10 3/mm  - 241         STUDIES: Mm Diag Breast Tomo Bilateral  11/19/2014   CLINICAL DATA:  Status post right lumpectomy and radiation therapy for breast cancer 1 year ago.  EXAM: DIGITAL DIAGNOSTIC BILATERAL MAMMOGRAM WITH 3D TOMOSYNTHESIS AND CAD  COMPARISON:  Previous exam(s).  ACR Breast Density Category c: The breast tissue is heterogeneously dense, which may obscure small  masses.  FINDINGS: Interval post lumpectomy changes in the 12 o'clock position of the right breast. No findings elsewhere in either breast suspicious for malignancy.  Mammographic images were processed with CAD.  IMPRESSION: No evidence of malignancy.  RECOMMENDATION: Bilateral diagnostic mammogram in 1 year.  I have discussed the findings and recommendations with the patient. Results were also provided in writing at the conclusion of the visit. If applicable, a reminder letter will be sent to the patient regarding the next appointment.  BI-RADS CATEGORY  2: Benign.   Electronically Signed   By: Claudie Revering M.D.   On: 11/19/2014 10:04    ASSESSMENT: Ductal carcinoma in situ (right breast) status post lumpectomy and radiation therapy On clinical exam there is no evidence of recurrent disease Patient had estrogen and progesterone receptor negative tumor so no further anti-hormonal therapy has been recommended  MEDICAL DECISION MAKING:  Mammogram of August of 09323 evidence of malignancy. Patient expressed understanding and was in agreement with  this plan. She also understands that She can call clinic at any time with any questions, concerns, or complaints.    No matching staging information was found for the patient.  Forest Gleason, MD   12/06/2014 5:28 PM

## 2015-03-08 ENCOUNTER — Other Ambulatory Visit: Payer: Self-pay | Admitting: Family Medicine

## 2015-03-08 DIAGNOSIS — I471 Supraventricular tachycardia: Secondary | ICD-10-CM

## 2015-03-24 ENCOUNTER — Encounter: Payer: Self-pay | Admitting: Family Medicine

## 2015-03-24 ENCOUNTER — Ambulatory Visit (INDEPENDENT_AMBULATORY_CARE_PROVIDER_SITE_OTHER): Payer: Medicare Other | Admitting: Family Medicine

## 2015-03-24 VITALS — BP 110/78 | HR 56 | Temp 98.6°F | Resp 16 | Ht 66.0 in | Wt 146.0 lb

## 2015-03-24 DIAGNOSIS — G4700 Insomnia, unspecified: Secondary | ICD-10-CM | POA: Diagnosis not present

## 2015-03-24 DIAGNOSIS — Z124 Encounter for screening for malignant neoplasm of cervix: Secondary | ICD-10-CM | POA: Diagnosis not present

## 2015-03-24 DIAGNOSIS — I471 Supraventricular tachycardia: Secondary | ICD-10-CM

## 2015-03-24 DIAGNOSIS — M79601 Pain in right arm: Secondary | ICD-10-CM

## 2015-03-24 DIAGNOSIS — N644 Mastodynia: Secondary | ICD-10-CM | POA: Diagnosis not present

## 2015-03-24 DIAGNOSIS — Z0000 Encounter for general adult medical examination without abnormal findings: Secondary | ICD-10-CM | POA: Diagnosis not present

## 2015-03-24 DIAGNOSIS — E7800 Pure hypercholesterolemia, unspecified: Secondary | ICD-10-CM

## 2015-03-24 MED ORDER — ZOLPIDEM TARTRATE 10 MG PO TABS: 5.0000 mg | ORAL_TABLET | Freq: Every day | ORAL | Status: DC

## 2015-03-24 MED ORDER — ATENOLOL 25 MG PO TABS: 25.0000 mg | ORAL_TABLET | Freq: Every day | ORAL | Status: DC

## 2015-03-24 NOTE — Progress Notes (Signed)
Patient ID: WANEDA CAPULONG, female   DOB: [birth year], 69 y.o.   MRN: [id number]        Patient: Claudia Little, Female    DOB: November 29, 1945, 69 y.o.   MRN: [id number] Visit Date: 03/24/2015  Today's Provider: Margarita Rana, MD   Chief Complaint  Patient presents with  . Medicare Wellness   Subjective:    Annual wellness visit Claudia Little is a 69 y.o. female. She feels well. She reports exercising 2 times a week. She reports she is sleeping well, 5-6 hours.  03/22/14 CPE 03/22/14 Pap-atypical cells, recheck in 1 yrs 11/19/14 Mammo-benign findings 02/18/12 Colon-polyps, diverticulosis 05/04/14 BMD-osteoporsis  Lab Results  Component Value Date   WBC 5.5 06/30/2014   HGB 13.5 06/30/2014   HCT 41 06/30/2014   PLT 241 04/14/2014   GLUCOSE 98 04/14/2014   CHOL 222* 06/30/2014   TRIG 173* 06/30/2014   HDL 55 06/30/2014   LDLCALC 132 06/30/2014   ALT 13 06/30/2014   AST 14 06/30/2014   NA 140 06/30/2014   K 4.3 06/30/2014   CL 105 04/14/2014   CREATININE 0.9 06/30/2014   BUN 4 06/30/2014   CO2 30 04/14/2014   TSH 0.87 03/24/2014   -----------------------------------------------------------  Breast pain Patient is complaning of right breast pain. Patient reports she has noticed pain for a few months following her surgery in 12/2014. Does have some tenderness to palpitations.    Joint/Muscle Pain: Patient complains of myalgias for which has been present for a few weeks. Pain is located in the right shoulder(s), is described as aching and dull, and is intermittent .  Associated symptoms include: none.  The patient has tried nothing for pain relief.  Related to injury:   no.    Review of Systems  Constitutional: Negative.   HENT: Negative.   Eyes: Negative.   Respiratory: Negative.   Cardiovascular: Negative.   Gastrointestinal: Negative.   Endocrine: Negative.   Genitourinary: Negative.   Musculoskeletal: Negative.   Skin: Negative.   Allergic/Immunologic: Negative.    Neurological: Negative.   Hematological: Negative.   Psychiatric/Behavioral: Negative.     Social History   Social History  . Marital Status: Married    Spouse Name: Richard  . Number of Children: 1  . Years of Education: HS   Occupational History  . Retired    Social History Main Topics  . Smoking status: Never Smoker   . Smokeless tobacco: Former Systems developer  . Alcohol Use: 4.2 oz/week    7 Glasses of wine per week  . Drug Use: No  . Sexual Activity: Not on file   Other Topics Concern  . Not on file   Social History Narrative    Patient Active Problem List   Diagnosis Date Noted  . Anxiety 07/26/2014  . Breast CA ([place]) 07/26/2014  . Inconclusive mammogram 07/26/2014  . Abnormal liver enzymes 07/26/2014  . History of colon polyps 07/26/2014  . Hypercholesteremia 07/26/2014  . HLD (hyperlipidemia) 07/26/2014  . Hypertriglyceridemia 07/26/2014  . Cannot sleep 07/26/2014  . Breath shortness 07/26/2014  . Supraventricular tachycardia ([place]) 07/26/2014  . Benign neoplasm of large bowel 11/20/2013  . OSTEOPENIA 01/24/2009    Past Surgical History  Procedure Laterality Date  . Lithotripsy    . Bunionectomy  early '70s  . Dilation and curettage of uterus  1973    after miscarriage  . Breast surgery Right 12/15/2013    lumpectomy  . Breast excisional biopsy Right 12/15/2013  positive  . Breast cyst aspiration Left 10+ yrs ago    neg    Her family history includes Aortic stenosis in her father and mother; Arthritis in her maternal grandfather; Breast cancer (age of onset: 39) in her maternal aunt; Colon polyps in her sister; Congestive Heart Failure in her father and mother; Coronary artery disease in her father; Early death in her paternal grandmother; Kidney disease in her paternal grandfather; Ovarian cancer in her maternal aunt; Parkinsonism in her brother; Stroke in her maternal grandmother.    Previous Medications   ATENOLOL (TENORMIN) 25 MG TABLET    TAKE 1  TABLET BY MOUTH DAILY   BIOTIN (BIOTIN MAXIMUM STRENGTH) 10 MG TABS    Take 1 tablet by mouth daily.   CALCIUM CARBONATE-VIT D-MIN (RA CALCIUM 600/VIT D/MINERALS) 600-200 MG-UNIT TABS    Take 1 tablet by mouth 2 (two) times daily.     CHOLECALCIFEROL 1000 UNITS CAPSULE    Take 1,000 Units by mouth daily.    ZOLPIDEM (AMBIEN) 10 MG TABLET    Take 0.5 tablets (5 mg total) by mouth at bedtime.    Patient Care Team: Margarita Rana, MD as PCP - General (Family Medicine)     Objective:   Vitals: BP 110/78 mmHg  Pulse 56  Temp(Src) 98.6 F (37 C) (Oral)  Resp 16  Ht 5\' 6"  (1.676 m)  Wt 146 lb (66.225 kg)  BMI 23.58 kg/m2  SpO2 98%  Physical Exam  Constitutional: She is oriented to person, place, and time. She appears well-developed and well-nourished.  HENT:  Head: Normocephalic and atraumatic.  Right Ear: Tympanic membrane, external ear and ear canal normal.  Left Ear: Tympanic membrane, external ear and ear canal normal.  Nose: Nose normal.  Mouth/Throat: Uvula is midline, oropharynx is clear and moist and mucous membranes are normal.  Eyes: Conjunctivae, EOM and lids are normal. Pupils are equal, round, and reactive to light.  Neck: Trachea normal and normal range of motion. Neck supple. Carotid bruit is not present. No thyroid mass and no thyromegaly present.  Cardiovascular: Normal rate, regular rhythm and normal heart sounds.   Pulmonary/Chest: Effort normal and breath sounds normal.  Surgical scar and radiation tattoos on right breast   Abdominal: Soft. Normal appearance and bowel sounds are normal. There is no hepatosplenomegaly. There is no tenderness.  Genitourinary: Vagina normal. There is breast tenderness. No breast swelling or discharge.  Musculoskeletal:       Right shoulder: She exhibits decreased range of motion and tenderness.  Lymphadenopathy:    She has no cervical adenopathy.    She has no axillary adenopathy.  Neurological: She is alert and oriented to person,  place, and time. She has normal strength. No cranial nerve deficit.  Skin: Skin is warm, dry and intact.  Psychiatric: She has a normal mood and affect. Her speech is normal and behavior is normal. Judgment and thought content normal. Cognition and memory are normal.    Activities of Daily Living In your present state of health, do you have any difficulty performing the following activities: 03/24/2015 09/13/2014  Hearing? N N  Vision? N N  Difficulty concentrating or making decisions? N N  Walking or climbing stairs? N N  Dressing or bathing? N N  Doing errands, shopping? N N    Fall Risk Assessment Fall Risk  03/24/2015 09/13/2014  Falls in the past year? No No     Depression Screen PHQ 2/9 Scores 03/24/2015 09/13/2014  PHQ - 2 Score  1 0    Cognitive Testing - 6-CIT  Correct? Score   What year is it? yes 0 0 or 4  What month is it? yes 0 0 or 3  Memorize:    Pia Mau,  41,  [address]  [place],      What time is it? (within 1 hour) yes 0 0 or 3  Count backwards from 20 yes 0 0, 2, or 4  Name the months of the year yes 0 0, 2, or 4  Repeat name & address above no 2 0, 2, 4, 6, 8, or 10       TOTAL SCORE  2/28   Interpretation:  Normal  Normal (0-7) Abnormal (8-28)       Assessment & Plan:     Annual Wellness Visit  Reviewed patient's Family Medical History Reviewed and updated list of patient's medical providers Assessment of cognitive impairment was done Assessed patient's functional ability Established a written schedule for health screening [place] Completed and Reviewed  Exercise Activities and Dietary recommendations Goals    . Exercise 150 minutes per week (moderate activity)       Immunization History  Administered Date(s) Administered  . Pneumococcal Conjugate-13 03/22/2014  . Pneumococcal Polysaccharide-23 01/16/2011  . Tdap 03/22/2014  . Zoster 07/13/2010         1. Medicare annual wellness visit,  subsequent Stable. Patient advised to continue eating healthy and exercise daily.  2. Breast pain, right New problem. Worsening. Referred to general surgery. - Ambulatory referral to General Surgery  3. Pain of right upper extremity New problem. Do some stretching, ROMs, yoga moves and no weight bearing yoga moves like down dog.  Tylenol as needed. Patient instructed to call back if condition worsens or does not improve.     4. Hypercholesteremia - Comprehensive metabolic panel - Lipid Panel With LDL/HDL Ratio - TSH  5. Supraventricular tachycardia (HCC) Condition is stable. Please continue current medication and  plan of care as noted.  No recent episodes.   - atenolol (TENORMIN) 25 MG tablet; Take 1 tablet (25 mg total) by mouth daily.  Dispense: 90 tablet; Refill: 5 - CBC with Differential/Platelet  6. Cannot sleep - zolpidem (AMBIEN) 10 MG tablet; Take 0.5 tablets (5 mg total) by mouth at bedtime.  Dispense: 30 tablet; Refill: 5  7. Cervical cancer screening - Pap IG (Image Guided)   Patient seen and examined by Dr. Jerrell Belfast, and note scribed by Philbert Riser. Dimas, CMA.  I have reviewed the document for accuracy and completeness and I agree with above. Jerrell Belfast, MD   Margarita Rana, MD    ------------------------------------------------------------------------------------------------------------

## 2015-03-29 ENCOUNTER — Telehealth: Payer: Self-pay

## 2015-03-29 LAB — PAP IG (IMAGE GUIDED): PAP SMEAR COMMENT: 0

## 2015-03-29 NOTE — Telephone Encounter (Signed)
-----   Message from Margarita Rana, MD sent at 03/29/2015  4:45 PM EST ----- Pap is normal. Please notify patient.   Thanks.

## 2015-03-29 NOTE — Telephone Encounter (Signed)
Advised pt of lab results. Pt verbally acknowledges understanding. Emily Drozdowski, CMA   

## 2015-04-08 ENCOUNTER — Telehealth: Payer: Self-pay

## 2015-04-08 LAB — COMPREHENSIVE METABOLIC PANEL
ALK PHOS: 69 IU/L (ref 39–117)
ALT: 15 IU/L (ref 0–32)
AST: 15 IU/L (ref 0–40)
Albumin/Globulin Ratio: 1.6 (ref 1.1–2.5)
Albumin: 4.4 g/dL (ref 3.5–4.8)
BILIRUBIN TOTAL: 0.5 mg/dL (ref 0.0–1.2)
BUN/Creatinine Ratio: 22 (ref 11–26)
BUN: 19 mg/dL (ref 8–27)
CHLORIDE: 98 mmol/L (ref 96–106)
CO2: 24 mmol/L (ref 18–29)
Calcium: 9.7 mg/dL (ref 8.7–10.3)
Creatinine, Ser: 0.87 mg/dL (ref 0.57–1.00)
GFR calc non Af Amer: 68 mL/min/{1.73_m2} (ref >59)
GFR, EST AFRICAN AMERICAN: 78 mL/min/{1.73_m2} (ref >59)
GLUCOSE: 95 mg/dL (ref 65–99)
Globulin, Total: 2.8 g/dL (ref 1.5–4.5)
Potassium: 4.5 mmol/L (ref 3.5–5.2)
Sodium: 139 mmol/L (ref 134–144)
TOTAL PROTEIN: 7.2 g/dL (ref 6.0–8.5)

## 2015-04-08 LAB — LIPID PANEL WITH LDL/HDL RATIO
CHOLESTEROL TOTAL: 244 mg/dL — AB (ref 100–199)
HDL: 56 mg/dL (ref >39)
LDL Calculated: 145 mg/dL — ABNORMAL HIGH (ref 0–99)
LDl/HDL Ratio: 2.6 ratio units (ref 0.0–3.2)
TRIGLYCERIDES: 217 mg/dL — AB (ref 0–149)
VLDL CHOLESTEROL CAL: 43 mg/dL — AB (ref 5–40)

## 2015-04-08 LAB — TSH: TSH: 1.66 u[IU]/mL (ref 0.450–4.500)

## 2015-04-08 LAB — CBC WITH DIFFERENTIAL/PLATELET
BASOS ABS: 0 10*3/uL (ref 0.0–0.2)
Basos: 1 %
EOS (ABSOLUTE): 0.5 10*3/uL — AB (ref 0.0–0.4)
Eos: 8 %
HEMATOCRIT: 40.3 % (ref 34.0–46.6)
HEMOGLOBIN: 13.8 g/dL (ref 11.1–15.9)
Immature Grans (Abs): 0 10*3/uL (ref 0.0–0.1)
Immature Granulocytes: 1 %
LYMPHS ABS: 1.7 10*3/uL (ref 0.7–3.1)
Lymphs: 29 %
MCH: 31.9 pg (ref 26.6–33.0)
MCHC: 34.2 g/dL (ref 31.5–35.7)
MCV: 93 fL (ref 79–97)
Monocytes Absolute: 0.7 10*3/uL (ref 0.1–0.9)
Monocytes: 11 %
NEUTROS ABS: 3.1 10*3/uL (ref 1.4–7.0)
Neutrophils: 50 %
Platelets: 251 10*3/uL (ref 150–379)
RBC: 4.32 x10E6/uL (ref 3.77–5.28)
RDW: 12.6 % (ref 12.3–15.4)
WBC: 6 10*3/uL (ref 3.4–10.8)

## 2015-04-08 NOTE — Telephone Encounter (Signed)
Pt states she would like to consider her choices. Will give [place] a call back when she decides. Renaldo Fiddler, CMA

## 2015-04-08 NOTE — Telephone Encounter (Signed)
-----   Message from Margarita Rana, MD sent at 04/08/2015  9:27 AM EST ----- Labs stable except cholesterol is higher than previous and statin medication is recommended. Please see if patient would like to start a cholesterol medication or work on lifestyle modification.  Thanks.

## 2015-04-08 NOTE — Telephone Encounter (Signed)
LMTCB Miyako Oelke Drozdowski, CMA  

## 2015-04-19 ENCOUNTER — Encounter: Payer: Self-pay | Admitting: Family Medicine

## 2015-07-21 ENCOUNTER — Encounter: Payer: Self-pay | Admitting: Family Medicine

## 2015-07-26 ENCOUNTER — Other Ambulatory Visit: Payer: Self-pay | Admitting: Family Medicine

## 2015-07-26 DIAGNOSIS — G4700 Insomnia, unspecified: Secondary | ICD-10-CM

## 2015-07-26 MED ORDER — ZOLPIDEM TARTRATE 10 MG PO TABS: 5.0000 mg | ORAL_TABLET | Freq: Every day | ORAL | Status: DC

## 2015-07-26 NOTE — Telephone Encounter (Signed)
Printed, please fax or call in to pharmacy. Thank you.   

## 2015-07-26 NOTE — Telephone Encounter (Signed)
Pt is tyrying to get a RX filled that was written in Jan. For  zolpidem (AMBIEN) 10 MG tablet.  She said [facility] said we would have to call because of the date.  She is now using [facility]

## 2015-07-26 NOTE — Telephone Encounter (Signed)
RX called in at [facility] pharmacy  

## 2015-08-01 ENCOUNTER — Telehealth: Payer: Self-pay | Admitting: Family Medicine

## 2015-08-01 NOTE — Telephone Encounter (Signed)
RX was called in on 07/26/15-aa

## 2015-08-01 NOTE — Telephone Encounter (Signed)
Pt stated that she has spoke with [facility] on [address] and was advised that they haven't received a call from our office to refill zolpidem (AMBIEN) 10 MG tablet. Pt stated that she took a hard script for this medication to them that was written by Dr. Venia Minks in January of 2017 and advised pt that they would have to contact our office for authorization. Pt contacted our office on 07/26/15. Please see message from 07/26/15. Pt asked that we contact [facility] today if possible. Thanks TNP

## 2015-08-24 ENCOUNTER — Encounter: Payer: Self-pay | Admitting: Family Medicine

## 2015-08-24 DIAGNOSIS — E785 Hyperlipidemia, unspecified: Secondary | ICD-10-CM

## 2015-08-25 NOTE — Telephone Encounter (Signed)
Lab sheet at the front desk.   Thanks,   -Mickel Baas

## 2015-08-27 LAB — COMPREHENSIVE METABOLIC PANEL
ALBUMIN: 4.5 g/dL (ref 3.5–4.8)
ALT: 11 IU/L (ref 0–32)
AST: 14 IU/L (ref 0–40)
Albumin/Globulin Ratio: 1.8 (ref 1.2–2.2)
Alkaline Phosphatase: 69 IU/L (ref 39–117)
BILIRUBIN TOTAL: 0.5 mg/dL (ref 0.0–1.2)
BUN / CREAT RATIO: 16 (ref 12–28)
BUN: 13 mg/dL (ref 8–27)
CALCIUM: 9.2 mg/dL (ref 8.7–10.3)
CO2: 25 mmol/L (ref 18–29)
Chloride: 100 mmol/L (ref 96–106)
Creatinine, Ser: 0.8 mg/dL (ref 0.57–1.00)
GFR, EST AFRICAN AMERICAN: 86 mL/min/{1.73_m2} (ref >59)
GFR, EST NON AFRICAN AMERICAN: 75 mL/min/{1.73_m2} (ref >59)
GLUCOSE: 95 mg/dL (ref 65–99)
Globulin, Total: 2.5 g/dL (ref 1.5–4.5)
Potassium: 4.2 mmol/L (ref 3.5–5.2)
Sodium: 142 mmol/L (ref 134–144)
TOTAL PROTEIN: 7 g/dL (ref 6.0–8.5)

## 2015-08-27 LAB — LIPID PANEL
Chol/HDL Ratio: 3.7 ratio (ref 0.0–4.4)
Cholesterol, Total: 207 mg/dL — ABNORMAL HIGH (ref 100–199)
HDL: 56 mg/dL
LDL Calculated: 122 mg/dL — ABNORMAL HIGH (ref 0–99)
Triglycerides: 145 mg/dL (ref 0–149)
VLDL Cholesterol Cal: 29 mg/dL (ref 5–40)

## 2015-08-30 ENCOUNTER — Telehealth: Payer: Self-pay

## 2015-08-30 NOTE — Telephone Encounter (Signed)
-----   Message from Margarita Rana, MD sent at 08/27/2015  1:59 PM EDT ----- Labs stable.  Mildly elevated cholesterol.   Would recommend eat healthy and exercise and recheck annually. 10 year risk of heart disease is 8.5 percent.   Some experts recommend starting a statin at that level.. If patient has strong family history or concern about heart disease, can start a statin. Thanks.

## 2015-08-30 NOTE — Telephone Encounter (Signed)
Advised pt of lab results. Pt verbally acknowledges understanding. Pt would like to consider starting medication, and will discuss this at FU on 09/05/2015. Renaldo Fiddler, CMA

## 2015-09-05 ENCOUNTER — Encounter: Payer: Self-pay | Admitting: Family Medicine

## 2015-09-05 ENCOUNTER — Ambulatory Visit (INDEPENDENT_AMBULATORY_CARE_PROVIDER_SITE_OTHER): Payer: Medicare Other | Admitting: Family Medicine

## 2015-09-05 VITALS — BP 118/70 | HR 64 | Temp 97.8°F | Resp 16 | Wt 143.0 lb

## 2015-09-05 DIAGNOSIS — F419 Anxiety disorder, unspecified: Secondary | ICD-10-CM

## 2015-09-05 DIAGNOSIS — I471 Supraventricular tachycardia: Secondary | ICD-10-CM | POA: Diagnosis not present

## 2015-09-05 DIAGNOSIS — G4700 Insomnia, unspecified: Secondary | ICD-10-CM | POA: Diagnosis not present

## 2015-09-05 DIAGNOSIS — E7800 Pure hypercholesterolemia, unspecified: Secondary | ICD-10-CM

## 2015-09-05 MED ORDER — ALPRAZOLAM 0.5 MG PO TABS: 0.5000 mg | ORAL_TABLET | Freq: Two times a day (BID) | ORAL | Status: DC | PRN

## 2015-09-05 MED ORDER — ZOLPIDEM TARTRATE 5 MG PO TABS: 5.0000 mg | ORAL_TABLET | Freq: Every day | ORAL | Status: DC

## 2015-09-05 MED ORDER — ROSUVASTATIN CALCIUM 5 MG PO TABS: 5.0000 mg | ORAL_TABLET | Freq: Every day | ORAL | Status: DC

## 2015-09-05 MED ORDER — COENZYME Q10 100 MG PO TABS: 100.0000 mg | ORAL_TABLET | Freq: Every day | ORAL | Status: AC

## 2015-09-05 MED ORDER — ATENOLOL 25 MG PO TABS: 25.0000 mg | ORAL_TABLET | Freq: Every day | ORAL | Status: DC

## 2015-09-05 NOTE — Progress Notes (Signed)
Subjective:    Patient ID: Claudia Little, female    DOB: December 18, 1945, 70 y.o.   MRN: [id number]   Chief Complaint  Patient presents with  . Tachycardia    supraventricular  . Hyperlipidemia  . Insomnia     Hyperlipidemia This is a chronic problem. The problem is controlled (Has done a great job with her diet. ). She has no history of obesity. There are no known factors aggravating her hyperlipidemia. Pertinent negatives include no chest pain, focal sensory loss or shortness of breath. Current antihyperlipidemic treatment includes diet change and exercise. The current treatment provides moderate improvement of lipids. There are no known risk factors for coronary artery disease.  Insomnia Primary symptoms: sleep disturbance (improved on Ambien).  The current episode started more than one year. The problem occurs nightly. The problem is unchanged. The symptoms are aggravated by anxiety. How many beverages per day that contain caffeine: 0 - 1.  Types of beverages you drink: coffee. The symptoms are relieved by medication. Past treatments include alcohol and medication (Does not take the Ambien regularly. Afraid to get addicted. ). The treatment provided moderate (Does help but does not want to take regularly. ) relief. Typical bedtime:  10-11 P.M..  PMH includes: no depression. Hypertension: Does take medication for tachycardia.      Also would like a refill on all of her medications.  She would like them all to go to [facility].   Tachycardia under good control. Atenolol has been the best thing that has ever happened to her.   Tolerated medication without difficulty.   Would like to continue it.    Patient Active Problem List   Diagnosis Date Noted  . Anxiety 07/26/2014  . Breast CA ([place]) 07/26/2014  . Inconclusive mammogram 07/26/2014  . Abnormal liver enzymes 07/26/2014  . History of colon polyps 07/26/2014  . Hypercholesteremia 07/26/2014  . HLD (hyperlipidemia) 07/26/2014  . Insomnia  07/26/2014  . Breath shortness 07/26/2014  . Supraventricular tachycardia ([place]) 07/26/2014  . Benign neoplasm of large bowel 11/20/2013  . OSTEOPENIA 01/24/2009   Past Medical History  Diagnosis Date  . Insomnia   . Nephrolithiasis   . Chronic anxiety   . Palpitations   . SVT (supraventricular tachycardia) ([place])   . PAC (premature atrial contraction)   . Osteoporosis   . Breast cancer ([place]) 12/2013    radiation   Current Outpatient Prescriptions on File Prior to Visit  Medication Sig  . Biotin (BIOTIN MAXIMUM STRENGTH) 10 MG TABS Take 1 tablet by mouth daily.  . Cholecalciferol 1000 UNITS capsule Take 1,000 Units by mouth daily.    No current facility-administered medications on file prior to visit.   Allergies  Allergen Reactions  . Oxycodone Nausea And Vomiting   Past Surgical History  Procedure Laterality Date  . Lithotripsy    . Bunionectomy  early '70s  . Dilation and curettage of uterus  1973    after miscarriage  . Breast surgery Right 12/15/2013    lumpectomy  . Breast excisional biopsy Right 12/15/2013    positive  . Breast cyst aspiration Left 10+ yrs ago    neg   Social History   Social History  . Marital Status: Married    Spouse Name: Richard  . Number of Children: 1  . Years of Education: HS   Occupational History  . Retired    Social History Main Topics  . Smoking status: Never Smoker   . Smokeless tobacco: Former Systems developer  .  Alcohol Use: 4.2 oz/week    7 Glasses of wine per week  . Drug Use: No  . Sexual Activity: Not on file   Other Topics Concern  . Not on file   Social History Narrative   Family History  Problem Relation Age of Onset  . Aortic stenosis Mother   . Congestive Heart Failure Mother   . Aortic stenosis Father   . Coronary artery disease Father     Deceased from complications of atherosclerotic CAD and Atherosclerosis  . Congestive Heart Failure Father   . Parkinsonism Brother   . Colon polyps Sister   . Stroke  Maternal Grandmother   . Arthritis Maternal Grandfather   . Early death Paternal Grandmother   . Kidney disease Paternal Grandfather   . Breast cancer Maternal Aunt 70  . Ovarian cancer Maternal Aunt      Review of Systems  Constitutional: Negative for fever, chills, diaphoresis, activity change, appetite change, fatigue and unexpected weight change.  Respiratory: Negative for cough, shortness of breath and wheezing.   Cardiovascular: Negative for chest pain, palpitations and leg swelling.  Psychiatric/Behavioral: Positive for sleep disturbance (improved on Ambien). Negative for depression. The patient has insomnia.        Objective:   Physical Exam  Constitutional: She is oriented to person, place, and time. She appears well-developed and well-nourished.  Neurological: She is alert and oriented to person, place, and time.  Psychiatric: She has a normal mood and affect. Her behavior is normal. Judgment and thought content normal.   BP 118/70 mmHg  Pulse 64  Temp(Src) 97.8 F (36.6 C) (Oral)  Resp 16  Wt 143 lb (64.864 kg)      Assessment & Plan:  1. Supraventricular tachycardia (HCC) Condition is stable. Please continue current medication and  plan of care as noted.   - atenolol (TENORMIN) 25 MG tablet; Take 1 tablet (25 mg total) by mouth daily.  Dispense: 90 tablet; Refill: 3  2. Cannot sleep Not at goal.  Will look into segmented sleep.  Will also check her medication.   - zolpidem (AMBIEN) 5 MG tablet; Take 1 tablet (5 mg total) by mouth at bedtime.  Dispense: 90 tablet; Refill: 3 - ALPRAZolam (XANAX) 0.5 MG tablet; Take 1 tablet (0.5 mg total) by mouth 2 (two) times daily as needed for anxiety.  Dispense: 30 tablet; Refill: 5  3. Hypercholesteremia Will start Crestor and CoQ10 as below and follow up with Tawanna Sat in 8 weeks for lab recheck and to meet her.   - rosuvastatin (CRESTOR) 5 MG tablet; Take 1 tablet (5 mg total) by mouth at bedtime. To take two times a week.   Dispense: 30 tablet; Refill: 5 - Coenzyme Q10 100 MG TABS; Take 1 tablet (100 mg total) by mouth daily.  Dispense: 30 tablet; Refill: 0  4. Anxiety Stable. Please continue medication.     Margarita Rana, MD

## 2015-10-06 ENCOUNTER — Ambulatory Visit: Payer: Medicare Other | Admitting: Radiation Oncology

## 2015-11-01 ENCOUNTER — Ambulatory Visit: Payer: Medicare Other | Admitting: Oncology

## 2015-11-01 ENCOUNTER — Other Ambulatory Visit: Payer: Medicare Other

## 2015-11-09 ENCOUNTER — Ambulatory Visit (INDEPENDENT_AMBULATORY_CARE_PROVIDER_SITE_OTHER): Payer: Medicare Other | Admitting: Physician Assistant

## 2015-11-09 ENCOUNTER — Encounter: Payer: Self-pay | Admitting: Physician Assistant

## 2015-11-09 VITALS — BP 120/68 | HR 64 | Temp 98.2°F | Resp 16 | Ht 64.0 in | Wt 148.0 lb

## 2015-11-09 DIAGNOSIS — E785 Hyperlipidemia, unspecified: Secondary | ICD-10-CM

## 2015-11-09 NOTE — Progress Notes (Signed)
Patient: Claudia Little Female    DOB: 11/18/1945   70 y.o.   MRN: [id number] Visit Date: 11/09/2015  Today's Provider: Mar Daring, PA-C   Chief Complaint  Patient presents with  . Hyperlipidemia   Subjective:    HPI  Lipid/Cholesterol, Follow-up:   Last seen for this 8 weeks ago.  Management changes since that visit include starting Crestor 5mg  twice per week instead of daily and CoQ10.  . Last Lipid Panel:    Component Value Date/Time   CHOL 207 (H) 08/26/2015 0831   TRIG 145 08/26/2015 0831   HDL 56 08/26/2015 0831   CHOLHDL 3.7 08/26/2015 0831   LDLCALC 122 (H) 08/26/2015 0831    Risk factors for vascular disease include hypercholesterolemia  She reports excellent compliance with treatment. She is not having side effects.  Current symptoms include none and have been stable. Weight trend: increasing steadily Prior visit with dietician: no Current diet: in general, a "healthy" diet   Current exercise: yoga  Wt Readings from Last 3 Encounters:  11/09/15 148 lb (67.1 kg)  09/05/15 143 lb (64.9 kg)  03/24/15 146 lb (66.2 kg)   -------------------------------------------------------------------     Allergies  Allergen Reactions  . Oxycodone Nausea And Vomiting   Current Meds  Medication Sig  . ALPRAZolam (XANAX) 0.5 MG tablet Take 1 tablet (0.5 mg total) by mouth 2 (two) times daily as needed for anxiety.  Marland Kitchen atenolol (TENORMIN) 25 MG tablet Take 1 tablet (25 mg total) by mouth daily.  . Biotin 10000 MCG TABS Take 1 tablet by mouth daily.  . Cholecalciferol 1000 UNITS capsule Take 1,000 Units by mouth daily.   . Coenzyme Q10 100 MG TABS Take 1 tablet (100 mg total) by mouth daily.  . rosuvastatin (CRESTOR) 5 MG tablet Take 1 tablet (5 mg total) by mouth at bedtime. To take two times a week.  . zolpidem (AMBIEN) 5 MG tablet Take 1 tablet (5 mg total) by mouth at bedtime.  . [DISCONTINUED] Biotin (BIOTIN MAXIMUM STRENGTH) 10 MG TABS Take 1 tablet  by mouth daily.    Review of Systems  Constitutional: Negative for appetite change and fatigue.  Respiratory: Negative for cough, chest tightness and shortness of breath.   Cardiovascular: Negative for chest pain, palpitations and leg swelling.  Gastrointestinal: Negative for abdominal pain.  Musculoskeletal: Negative for myalgias.  Neurological: Negative for weakness and numbness.    Social History  Substance Use Topics  . Smoking status: Never Smoker  . Smokeless tobacco: Former Systems developer  . Alcohol use 4.2 oz/week    7 Glasses of wine per week   Objective:   BP 120/68 (BP Location: Left Arm, Patient Position: Sitting, Cuff Size: Normal)   Pulse 64   Temp 98.2 F (36.8 C) (Oral)   Resp 16   Ht 5\' 4"  (1.626 m)   Wt 148 lb (67.1 kg)   BMI 25.40 kg/m   Physical Exam  Constitutional: She appears well-developed and well-nourished. No distress.  Cardiovascular: Normal rate, regular rhythm and normal heart sounds.  Exam reveals no gallop and no friction rub.   No murmur heard. Pulmonary/Chest: Effort normal and breath sounds normal. No respiratory distress. She has no wheezes. She has no rales.  Skin: She is not diaphoretic.  Psychiatric: She has a normal mood and affect. Her behavior is normal. Judgment and thought content normal.  Vitals reviewed.     Assessment & Plan:     1. HLD (  hyperlipidemia) Will recheck cholesterol as below. She is to continue Crestor 5mg  twice per week and CoQ10 to avoid side effects. Will adjust if necessary depending on lab results. She is to continue healthy diet and exercise. I will see her back in 6 months for her CPE. She will need her BMD repeated at that time and colonoscopy is due 01/2017 as well.  - Lipid Profile       Mar Daring, PA-C  [facility]

## 2015-11-09 NOTE — Patient Instructions (Signed)

## 2015-11-16 LAB — LIPID PANEL
Chol/HDL Ratio: 3.2 ratio units (ref 0.0–4.4)
Cholesterol, Total: 164 mg/dL (ref 100–199)
HDL: 52 mg/dL (ref >39)
LDL CALC: 80 mg/dL (ref 0–99)
Triglycerides: 160 mg/dL — ABNORMAL HIGH (ref 0–149)
VLDL CHOLESTEROL CAL: 32 mg/dL (ref 5–40)

## 2015-11-21 ENCOUNTER — Ambulatory Visit
Admission: RE | Admit: 2015-11-21 | Discharge: 2015-11-21 | Disposition: A | Discharge status: Home / Self Care | Payer: Medicare Other | Source: Ambulatory Visit | Attending: Oncology | Admitting: Oncology

## 2015-11-21 ENCOUNTER — Other Ambulatory Visit: Payer: Self-pay | Admitting: Oncology

## 2015-11-21 DIAGNOSIS — C50919 Malignant neoplasm of unspecified site of unspecified female breast: Secondary | ICD-10-CM

## 2015-11-21 DIAGNOSIS — Z853 Personal history of malignant neoplasm of breast: Secondary | ICD-10-CM | POA: Diagnosis present

## 2015-11-22 ENCOUNTER — Other Ambulatory Visit: Payer: Self-pay

## 2015-11-22 DIAGNOSIS — C50919 Malignant neoplasm of unspecified site of unspecified female breast: Secondary | ICD-10-CM

## 2015-11-23 ENCOUNTER — Other Ambulatory Visit: Payer: Medicare Other

## 2015-11-23 ENCOUNTER — Inpatient Hospital Stay: Payer: Medicare Other

## 2015-11-23 ENCOUNTER — Ambulatory Visit: Payer: Medicare Other | Admitting: Oncology

## 2015-11-23 ENCOUNTER — Inpatient Hospital Stay: Payer: Medicare Other | Attending: Internal Medicine | Admitting: Internal Medicine

## 2015-11-23 DIAGNOSIS — Z79899 Other long term (current) drug therapy: Secondary | ICD-10-CM | POA: Diagnosis not present

## 2015-11-23 DIAGNOSIS — D0511 Intraductal carcinoma in situ of right breast: Secondary | ICD-10-CM | POA: Diagnosis not present

## 2015-11-23 DIAGNOSIS — I471 Supraventricular tachycardia: Secondary | ICD-10-CM | POA: Insufficient documentation

## 2015-11-23 DIAGNOSIS — I491 Atrial premature depolarization: Secondary | ICD-10-CM | POA: Diagnosis not present

## 2015-11-23 DIAGNOSIS — C50919 Malignant neoplasm of unspecified site of unspecified female breast: Secondary | ICD-10-CM

## 2015-11-23 DIAGNOSIS — Z923 Personal history of irradiation: Secondary | ICD-10-CM | POA: Diagnosis not present

## 2015-11-23 DIAGNOSIS — Z171 Estrogen receptor negative status [ER-]: Secondary | ICD-10-CM | POA: Insufficient documentation

## 2015-11-23 LAB — CBC WITH DIFFERENTIAL/PLATELET
BASOS ABS: 0.1 10*3/uL (ref 0–0.1)
BASOS PCT: 1 %
EOS ABS: 0.5 10*3/uL (ref 0–0.7)
Eosinophils Relative: 5 %
HCT: 40.1 % (ref 35.0–47.0)
HEMOGLOBIN: 14 g/dL (ref 12.0–16.0)
Lymphocytes Relative: 21 %
Lymphs Abs: 2 10*3/uL (ref 1.0–3.6)
MCH: 32.5 pg (ref 26.0–34.0)
MCHC: 34.9 g/dL (ref 32.0–36.0)
MCV: 93 fL (ref 80.0–100.0)
MONOS PCT: 9 %
Monocytes Absolute: 0.8 10*3/uL (ref 0.2–0.9)
NEUTROS PCT: 64 %
Neutro Abs: 5.9 10*3/uL (ref 1.4–6.5)
Platelets: 257 10*3/uL (ref 150–440)
RBC: 4.31 MIL/uL (ref 3.80–5.20)
RDW: 13.1 % (ref 11.5–14.5)
WBC: 9.2 10*3/uL (ref 3.6–11.0)

## 2015-11-23 LAB — COMPREHENSIVE METABOLIC PANEL
ALBUMIN: 4.8 g/dL (ref 3.5–5.0)
ALK PHOS: 58 U/L (ref 38–126)
ALT: 16 U/L (ref 14–54)
ANION GAP: 9 (ref 5–15)
AST: 21 U/L (ref 15–41)
BUN: 15 mg/dL (ref 6–20)
CALCIUM: 8.9 mg/dL (ref 8.9–10.3)
CO2: 28 mmol/L (ref 22–32)
Chloride: 97 mmol/L — ABNORMAL LOW (ref 101–111)
Creatinine, Ser: 0.8 mg/dL (ref 0.44–1.00)
GFR calc Af Amer: >60 mL/min (ref >60)
GFR calc non Af Amer: >60 mL/min (ref >60)
GLUCOSE: 104 mg/dL — AB (ref 65–99)
Potassium: 3.7 mmol/L (ref 3.5–5.1)
SODIUM: 134 mmol/L — AB (ref 135–145)
Total Bilirubin: 0.7 mg/dL (ref 0.3–1.2)
Total Protein: 8 g/dL (ref 6.5–8.1)

## 2015-11-23 NOTE — Progress Notes (Signed)
Pt had Mammo 8/21.

## 2015-11-23 NOTE — Assessment & Plan Note (Addendum)
#    Right breast DCiS status post lumpectomy followed by radiation ER/PR negative. Clinically no evidence of recurrence.  Breast exam-NED.  #  Long discussionthe patient regarding-  Not  Using  Tamoxifen for prevention as she is ER/PR negative. She understood the rationale.  #  Patient questioning need for follow-up appointment.   Patient understands that she should  kep her ppointments  With her -surgeon/ radiation oncologist planned.  Follow-up  With [place]  as needed.

## 2015-11-23 NOTE — Progress Notes (Signed)
RN Chaperoned provider with Breast Exam.   

## 2015-11-23 NOTE — Progress Notes (Signed)
[place] OFFICE PROGRESS NOTE  Patient Care Team: Margarita Rana, MD as PCP - General (Family Medicine)  No matching staging information was found for the patient.    No history exists.    #  right breast DCIS stage 0 (Tis N0 M0) ; Status post lumpectomy [Dr.Smith] and radiation therapy [Dr.Crysta]; Estrogen receptor negative.  Progesterone receptor negative. Patient has finished radiation therapy on December 8   INTERVAL HISTORY:  Claudia Little 70 y.o.  female pleasant patient above history of  Above history of DCIS right breast status post lumpectomy- ER/PR negative is here for folow-up.   Patient denies any lumps or bumps. Appetite is good.  No new symptom.  REVIEW OF SYSTEMS:  A complete 10 point review of system is done which is negative except mentioned above/history of present illness.   PAST MEDICAL HISTORY :  Past Medical History:  Diagnosis Date  . Breast cancer ([place]) 12/2013   radiation  . Chronic anxiety   . Insomnia   . Nephrolithiasis   . Osteoporosis   . PAC (premature atrial contraction)   . Palpitations   . SVT (supraventricular tachycardia) ([place])     PAST SURGICAL HISTORY :   Past Surgical History:  Procedure Laterality Date  . BREAST CYST ASPIRATION Left 10+ yrs ago   neg  . BREAST EXCISIONAL BIOPSY Right 12/15/2013   positive  . BREAST SURGERY Right 12/15/2013   lumpectomy  . BUNIONECTOMY  early '70s  . DILATION AND CURETTAGE OF UTERUS  1973   after miscarriage  . LITHOTRIPSY      FAMILY HISTORY :   Family History  Problem Relation Age of Onset  . Aortic stenosis Mother   . Congestive Heart Failure Mother   . Aortic stenosis Father   . Coronary artery disease Father     Deceased from complications of atherosclerotic CAD and Atherosclerosis  . Congestive Heart Failure Father   . Parkinsonism Brother   . Colon polyps Sister   . Arthritis Maternal Grandfather   . Early death Paternal Grandmother   . Stroke Maternal  Grandmother   . Kidney disease Paternal Grandfather   . Breast cancer Maternal Aunt 70  . Ovarian cancer Maternal Aunt     SOCIAL HISTORY:   Social History  Substance Use Topics  . Smoking status: Never Smoker  . Smokeless tobacco: Former Systems developer  . Alcohol use 4.2 oz/week    7 Glasses of wine per week    ALLERGIES:  is allergic to oxycodone.  MEDICATIONS:  Current Outpatient Prescriptions  Medication Sig Dispense Refill  . ALPRAZolam (XANAX) 0.5 MG tablet Take 1 tablet (0.5 mg total) by mouth 2 (two) times daily as needed for anxiety. 30 tablet 5  . atenolol (TENORMIN) 25 MG tablet Take 1 tablet (25 mg total) by mouth daily. 90 tablet 3  . Biotin 10000 MCG TABS Take 1 tablet by mouth daily.    . Cholecalciferol 1000 UNITS capsule Take 1,000 Units by mouth daily.     . Coenzyme Q10 100 MG TABS Take 1 tablet (100 mg total) by mouth daily. 30 tablet 0  . rosuvastatin (CRESTOR) 5 MG tablet Take 1 tablet (5 mg total) by mouth at bedtime. To take two times a week. 30 tablet 5  . zolpidem (AMBIEN) 5 MG tablet Take 1 tablet (5 mg total) by mouth at bedtime. (Patient not taking: Reported on 11/23/2015) 90 tablet 3   No current facility-administered medications for this visit.  PHYSICAL EXAMINATION: ECOG PERFORMANCE STATUS: 0 - Asymptomatic  BP 105/66 (BP Location: Left Arm, Patient Position: Sitting)   Pulse (!) 54   Temp 97.3 F (36.3 C) (Tympanic)   Resp 16   Ht 5' 6.5" (1.689 m)   Wt 144 lb 14.4 oz (65.7 kg)   BMI 23.04 kg/m    Filed Weights   11/23/15 1523  Weight: 144 lb 14.4 oz (65.7 kg)    GENERAL: Well-nourished well-developed; Alert, no distress and comfortable.   Alone.  EYES: no pallor or icterus OROPHARYNX: no thrush or ulceration; good dentition  NECK: supple, no masses felt LYMPH:  no palpable lymphadenopathy in the cervical, axillary or inguinal regions LUNGS: clear to auscultation and  No wheeze or crackles HEART/CVS: regular rate & rhythm and no murmurs;  No lower extremity edema ABDOMEN:abdomen soft, non-tender and normal bowel sounds Musculoskeletal:no cyanosis of digits and no clubbing  PSYCH: alert & oriented x 3 with fluent speech NEURO: no focal motor/sensory deficits SKIN:  no rashes or significant lesions Right and left BREAST exam [in the presence of nurse]- no unusual skin changes or dominant masses felt. Surgical scars noted.    LABORATORY DATA:  I have reviewed the data as listed    Component Value Date/Time   NA 134 (L) 11/23/2015 1455   NA 142 08/26/2015 0831   NA 140 04/14/2014 1433   K 3.7 11/23/2015 1455   K 3.7 04/14/2014 1433   CL 97 (L) 11/23/2015 1455   CL 105 04/14/2014 1433   CO2 28 11/23/2015 1455   CO2 30 04/14/2014 1433   GLUCOSE 104 (H) 11/23/2015 1455   GLUCOSE 98 04/14/2014 1433   BUN 15 11/23/2015 1455   BUN 13 08/26/2015 0831   BUN 14 04/14/2014 1433   CREATININE 0.80 11/23/2015 1455   CREATININE 0.89 04/14/2014 1433   CALCIUM 8.9 11/23/2015 1455   CALCIUM 8.9 04/14/2014 1433   PROT 8.0 11/23/2015 1455   PROT 7.0 08/26/2015 0831   PROT 7.4 04/14/2014 1433   ALBUMIN 4.8 11/23/2015 1455   ALBUMIN 4.5 08/26/2015 0831   ALBUMIN 3.7 04/14/2014 1433   AST 21 11/23/2015 1455   AST 27 04/14/2014 1433   ALT 16 11/23/2015 1455   ALT 29 04/14/2014 1433   ALKPHOS 58 11/23/2015 1455   ALKPHOS 73 04/14/2014 1433   BILITOT 0.7 11/23/2015 1455   BILITOT 0.5 08/26/2015 0831   BILITOT 0.4 04/14/2014 1433   GFRNONAA >60 11/23/2015 1455   GFRNONAA >60 04/14/2014 1433   GFRAA >60 11/23/2015 1455   GFRAA >60 04/14/2014 1433    No results found for: SPEP, UPEP  Lab Results  Component Value Date   WBC 9.2 11/23/2015   NEUTROABS 5.9 11/23/2015   HGB 14.0 11/23/2015   HCT 40.1 11/23/2015   MCV 93.0 11/23/2015   PLT 257 11/23/2015      Chemistry      Component Value Date/Time   NA 134 (L) 11/23/2015 1455   NA 142 08/26/2015 0831   NA 140 04/14/2014 1433   K 3.7 11/23/2015 1455   K 3.7  04/14/2014 1433   CL 97 (L) 11/23/2015 1455   CL 105 04/14/2014 1433   CO2 28 11/23/2015 1455   CO2 30 04/14/2014 1433   BUN 15 11/23/2015 1455   BUN 13 08/26/2015 0831   BUN 14 04/14/2014 1433   CREATININE 0.80 11/23/2015 1455   CREATININE 0.89 04/14/2014 1433   GLU 102 06/30/2014      Component  Value Date/Time   CALCIUM 8.9 11/23/2015 1455   CALCIUM 8.9 04/14/2014 1433   ALKPHOS 58 11/23/2015 1455   ALKPHOS 73 04/14/2014 1433   AST 21 11/23/2015 1455   AST 27 04/14/2014 1433   ALT 16 11/23/2015 1455   ALT 29 04/14/2014 1433   BILITOT 0.7 11/23/2015 1455   BILITOT 0.5 08/26/2015 0831   BILITOT 0.4 04/14/2014 1433       RADIOGRAPHIC STUDIES: I have personally reviewed the radiological images as listed and agreed with the findings in the report. No results found.   ASSESSMENT & PLAN:  Ductal carcinoma in situ (DCIS) of right breast #  Right breast DCiS status post lumpectomy followed by radiation ER/PR negative. Clinically no evidence of recurrence.  Breast exam-NED.  #  Long discussionthe patient regarding-  Not  Using  Tamoxifen for prevention as she is ER/PR negative. She understood the rationale.  #  Patient questioning need for follow-up appointment.   Patient understands that she should  kep her ppointments  With her -surgeon/ radiation oncologist planned.  Follow-up  With [place]  as needed.   No orders of the defined types were placed in this encounter.  All questions were answered. The patient knows to call the clinic with any problems, questions or concerns.      Cammie Sickle, MD 11/23/2015 4:57 PM

## 2015-12-12 ENCOUNTER — Encounter: Payer: Self-pay | Admitting: Physician Assistant

## 2016-01-20 ENCOUNTER — Encounter: Payer: Self-pay | Admitting: *Deleted

## 2016-03-08 ENCOUNTER — Ambulatory Visit (INDEPENDENT_AMBULATORY_CARE_PROVIDER_SITE_OTHER): Payer: Medicare Other | Admitting: Internal Medicine

## 2016-03-08 ENCOUNTER — Encounter: Payer: Self-pay | Admitting: Internal Medicine

## 2016-03-08 DIAGNOSIS — Z86010 Personal history of colonic polyps: Secondary | ICD-10-CM | POA: Diagnosis not present

## 2016-03-08 DIAGNOSIS — G4700 Insomnia, unspecified: Secondary | ICD-10-CM

## 2016-03-08 DIAGNOSIS — E7800 Pure hypercholesterolemia, unspecified: Secondary | ICD-10-CM | POA: Diagnosis not present

## 2016-03-08 DIAGNOSIS — Z8742 Personal history of other diseases of the female genital tract: Secondary | ICD-10-CM

## 2016-03-08 DIAGNOSIS — Z87898 Personal history of other specified conditions: Secondary | ICD-10-CM

## 2016-03-08 DIAGNOSIS — I471 Supraventricular tachycardia: Secondary | ICD-10-CM | POA: Diagnosis not present

## 2016-03-08 DIAGNOSIS — C50919 Malignant neoplasm of unspecified site of unspecified female breast: Secondary | ICD-10-CM

## 2016-03-08 DIAGNOSIS — Z171 Estrogen receptor negative status [ER-]: Secondary | ICD-10-CM

## 2016-03-08 DIAGNOSIS — D0511 Intraductal carcinoma in situ of right breast: Secondary | ICD-10-CM

## 2016-03-08 NOTE — Progress Notes (Signed)
Patient ID: Claudia Little, female   DOB: 12-13-1945, 70 y.o.   MRN: [id number]   Subjective:    Patient ID: Claudia Little, female    DOB: [birth year], 70 y.o.   MRN: [id number]  HPI  Patient here to establish care.  Former pt of Dr Venia Minks.  She has a history of SVT.  Controlled on atenolol.  She also has a history of right breast DCIS stage 0 s/p lumpectomy.  Performed by Dr Tamala Julian.  S/p XRT.  Is ER/PR negative.  Last mammogram 11/2015 - Birads II.  Last pap 03/24/15 - ok.  Previously had some atypical cells noted on pap.  Also has a history of colon polyps.  Last colonoscopy 02/2012.  Recommended f/u 5 years.  She is also on crestor for high cholesterol.  Only takes it 2x/week.  Has modified her diet.  She tries to stay active.  No chest pain.  No sob.  No acid reflux.  No abdominal pain or cramping.  Bowels stable.      Past Medical History:  Diagnosis Date  . Breast cancer ([place]) 12/2013   radiation  . Chronic anxiety   . Insomnia   . Nephrolithiasis   . Osteoporosis   . PAC (premature atrial contraction)   . Palpitations   . SVT (supraventricular tachycardia) ([facility])    Past Surgical History:  Procedure Laterality Date  . BREAST CYST ASPIRATION Left 10+ yrs ago   neg  . BREAST EXCISIONAL BIOPSY Right 12/15/2013   positive  . BREAST SURGERY Right 12/15/2013   lumpectomy  . BUNIONECTOMY  early '70s  . DILATION AND CURETTAGE OF UTERUS  1973   after miscarriage  . LITHOTRIPSY     Family History  Problem Relation Age of Onset  . Aortic stenosis Mother   . Congestive Heart Failure Mother   . Aortic stenosis Father   . Coronary artery disease Father     Deceased from complications of atherosclerotic CAD and Atherosclerosis  . Congestive Heart Failure Father   . Parkinsonism Brother   . Colon polyps Sister   . Arthritis Maternal Grandfather   . Early death Paternal Grandmother   . Stroke Maternal Grandmother   . Kidney disease Paternal Grandfather   . Breast cancer Maternal Aunt  70  . Ovarian cancer Maternal Aunt    Social History   Social History  . Marital status: Married    Spouse name: Richard  . Number of children: 1  . Years of education: HS   Occupational History  . Retired    Social History Main Topics  . Smoking status: Never Smoker  . Smokeless tobacco: Former Systems developer  . Alcohol use 4.2 oz/week    7 Glasses of wine per week  . Drug use: No  . Sexual activity: Not Asked   Other Topics Concern  . None   Social History Narrative  . None    Outpatient Encounter Prescriptions as of 03/08/2016  Medication Sig  . ALPRAZolam (XANAX) 0.5 MG tablet Take 1 tablet (0.5 mg total) by mouth 2 (two) times daily as needed for anxiety.  Marland Kitchen atenolol (TENORMIN) 25 MG tablet Take 1 tablet (25 mg total) by mouth daily.  . Biotin 10000 MCG TABS Take 1 tablet by mouth daily.  . Cholecalciferol 1000 UNITS capsule Take 1,000 Units by mouth daily.   . rosuvastatin (CRESTOR) 5 MG tablet Take 1 tablet (5 mg total) by mouth at bedtime. To take two times a week.  . zolpidem (  AMBIEN) 5 MG tablet Take 1 tablet (5 mg total) by mouth at bedtime.  . Coenzyme Q10 100 MG TABS Take 1 tablet (100 mg total) by mouth daily. (Patient not taking: Reported on 03/08/2016)   No facility-administered encounter medications on file as of 03/08/2016.     Review of Systems  Constitutional: Negative for appetite change and unexpected weight change.  HENT: Negative for congestion and sinus pressure.   Respiratory: Negative for cough, chest tightness and shortness of breath.   Cardiovascular: Negative for chest pain, palpitations and leg swelling.  Gastrointestinal: Negative for abdominal pain, diarrhea, nausea and vomiting.  Genitourinary: Negative for difficulty urinating and dysuria.  Musculoskeletal: Negative for back pain and joint swelling.  Skin: Negative for color change and rash.  Neurological: Negative for dizziness, light-headedness and headaches.  Psychiatric/Behavioral:  Negative for agitation and dysphoric mood.       Objective:     Blood pressure rechecked by me:  130/82  Physical Exam  Constitutional: She appears well-developed and well-nourished. No distress.  HENT:  Nose: Nose normal.  Mouth/Throat: Oropharynx is clear and moist.  Neck: Neck supple. No thyromegaly present.  Cardiovascular: Normal rate and regular rhythm.   Pulmonary/Chest: Breath sounds normal. No respiratory distress. She has no wheezes.  Abdominal: Soft. Bowel sounds are normal. There is no tenderness.  Musculoskeletal: She exhibits no edema or tenderness.  Lymphadenopathy:    She has no cervical adenopathy.  Skin: No rash noted. No erythema.  Psychiatric: She has a normal mood and affect. Her behavior is normal.    BP 130/82   Pulse (!) 56   Ht 5\' 7"  (1.702 m)   Wt 147 lb 12.8 oz (67 kg)   SpO2 99%   BMI 23.15 kg/m  Wt Readings from Last 3 Encounters:  03/08/16 147 lb 12.8 oz (67 kg)  11/23/15 144 lb 14.4 oz (65.7 kg)  11/09/15 148 lb (67.1 kg)     Lab Results  Component Value Date   WBC 9.2 11/23/2015   HGB 14.0 11/23/2015   HCT 40.1 11/23/2015   PLT 257 11/23/2015   GLUCOSE 104 (H) 11/23/2015   CHOL 164 11/15/2015   TRIG 160 (H) 11/15/2015   HDL 52 11/15/2015   LDLCALC 80 11/15/2015   ALT 16 11/23/2015   AST 21 11/23/2015   NA 134 (L) 11/23/2015   K 3.7 11/23/2015   CL 97 (L) 11/23/2015   CREATININE 0.80 11/23/2015   BUN 15 11/23/2015   CO2 28 11/23/2015   TSH 1.660 04/07/2015    Mm Diag Breast Tomo Bilateral  Result Date: 11/21/2015 CLINICAL DATA:  History of right breast cancer status post lumpectomy in 2015. EXAM: 2D DIGITAL DIAGNOSTIC BILATERAL MAMMOGRAM WITH CAD AND ADJUNCT TOMO COMPARISON:  Previous exam(s). ACR Breast Density Category c: The breast tissue is heterogeneously dense, which may obscure small masses. FINDINGS: Lumpectomy changes are seen in the right breast. No suspicious mass or malignant type microcalcifications identified  in either breast. Mammographic images were processed with CAD. IMPRESSION: No evidence of malignancy in either breast. RECOMMENDATION: Bilateral diagnostic mammogram in 1 year is recommended. I have discussed the findings and recommendations with the patient. Results were also provided in writing at the conclusion of the visit. If applicable, a reminder letter will be sent to the patient regarding the next appointment. BI-RADS CATEGORY  2: Benign. Electronically Signed   By: Lillia Mountain M.D.   On: 11/21/2015 13:34       Assessment & Plan:  Problem List Items Addressed This Visit    Breast CA ([place])    Mammogram 11/21/15 - Birads II.        Ductal carcinoma in situ (DCIS) of right breast    Right breast DCIS s/p lumpectomy.  Also s/p XRT.  Is ER/PR negative.  Clinically no evidence of recurrence.  Mammogram 11/21/15 - Birads II.        Relevant Orders   CBC with Differential/Platelet   History of abnormal cervical Pap smear    Had repeat pap 03/24/15 - ok.        History of colon polyps    Last colonoscopy 02/2012.  Recommended f/u in 5 years per report.  Follow.        Hypercholesteremia    On crestor 2x/week.  Tolerating.  Has adjusted diet.  Follow lipid panel and liver function tests.        Relevant Orders   Comprehensive metabolic panel   Lipid panel   Insomnia    Takes ambien prn.  Follow.        Supraventricular tachycardia ([place])    Doing well on atenolol.  Is s/p consultation with Dr Caryl Comes.  S/p holter and event monitor.        Relevant Orders   TSH     I spent 45 minutes with the patient and more than 50% of the time was spent in consultation regarding the above.    Marland Kitchen Einar Pheasant, MD

## 2016-03-09 ENCOUNTER — Ambulatory Visit: Payer: Medicare Other | Admitting: Radiation Oncology

## 2016-03-11 ENCOUNTER — Encounter: Payer: Self-pay | Admitting: Internal Medicine

## 2016-03-11 DIAGNOSIS — Z8742 Personal history of other diseases of the female genital tract: Secondary | ICD-10-CM | POA: Insufficient documentation

## 2016-03-11 NOTE — Assessment & Plan Note (Signed)
Takes ambien prn.  Follow.  

## 2016-03-11 NOTE — Assessment & Plan Note (Signed)
On crestor 2x/week.  Tolerating.  Has adjusted diet.  Follow lipid panel and liver function tests.

## 2016-03-11 NOTE — Assessment & Plan Note (Signed)
Doing well on atenolol.  Is s/p consultation with Dr Caryl Comes.  S/p holter and event monitor.

## 2016-03-11 NOTE — Assessment & Plan Note (Signed)
Mammogram 11/21/15 - Birads II.

## 2016-03-11 NOTE — Assessment & Plan Note (Signed)
Last colonoscopy 02/2012.  Recommended f/u in 5 years per report.  Follow.

## 2016-03-11 NOTE — Assessment & Plan Note (Signed)
Had repeat pap 03/24/15 - ok.

## 2016-03-11 NOTE — Assessment & Plan Note (Signed)
Right breast DCIS s/p lumpectomy.  Also s/p XRT.  Is ER/PR negative.  Clinically no evidence of recurrence.  Mammogram 11/21/15 - Birads II.

## 2016-03-13 ENCOUNTER — Ambulatory Visit
Admission: RE | Admit: 2016-03-13 | Discharge: 2016-03-13 | Disposition: A | Discharge status: Home / Self Care | Payer: Medicare Other | Source: Ambulatory Visit | Attending: Radiation Oncology | Admitting: Radiation Oncology

## 2016-03-13 ENCOUNTER — Encounter: Payer: Self-pay | Admitting: Radiation Oncology

## 2016-03-13 VITALS — BP 128/65 | HR 60 | Temp 98.0°F | Wt 149.1 lb

## 2016-03-13 DIAGNOSIS — Z923 Personal history of irradiation: Secondary | ICD-10-CM | POA: Insufficient documentation

## 2016-03-13 DIAGNOSIS — D0511 Intraductal carcinoma in situ of right breast: Secondary | ICD-10-CM

## 2016-03-13 DIAGNOSIS — Z86000 Personal history of in-situ neoplasm of breast: Secondary | ICD-10-CM | POA: Insufficient documentation

## 2016-03-13 NOTE — Progress Notes (Signed)
Radiation Oncology Follow up Note  Name: Claudia Little   Date:   03/13/2016 MRN:  [id number] DOB: Oct 29, 1945    This 70 y.o. female presents to the clinic today for 18 month follow-up status post radiation therapy to her right breast for ductal carcinoma in situ.  REFERRING PROVIDER: Margarita Rana, MD  HPI: Patient is a 70 year old female now out a year and a half having completed whole breast radiation to her right breast for ER/PR negative ductal carcinoma in situ. She is seen today in routine follow-up and is doing well. She specifically denies breast tenderness cough or bone pain.Marland Kitchen Her mammograms last were in August and BI-RADS 2 benign with 1 year follow-up recommended.  COMPLICATIONS OF TREATMENT: none  FOLLOW UP COMPLIANCE: keeps appointments   PHYSICAL EXAM:  BP 128/65   Pulse 60   Temp 98 F (36.7 C)   Wt 149 lb 2.3 oz (67.7 kg)   BMI 23.36 kg/m  Right breast is rather shrunken compared to the left breast although cosmetic result is still excellent. No dominant mass or nodularity is noted in either breast in 2 positions examined. No axillary or supraclavicular adenopathy is appreciated. Well-developed well-nourished patient in NAD. HEENT reveals PERLA, EOMI, discs not visualized.  Oral cavity is clear. No oral mucosal lesions are identified. Neck is clear without evidence of cervical or supraclavicular adenopathy. Lungs are clear to A&P. Cardiac examination is essentially unremarkable with regular rate and rhythm without murmur rub or thrill. Abdomen is benign with no organomegaly or masses noted. Motor sensory and DTR levels are equal and symmetric in the upper and lower extremities. Cranial nerves II through XII are grossly intact. Proprioception is intact. No peripheral adenopathy or edema is identified. No motor or sensory levels are noted. Crude visual fields are within normal range.  RADIOLOGY RESULTS: Last mammograms reviewed compatible above-stated findings  PLAN:  Present time patient is doing well with no evidence of disease. I've asked to see her out in 1 year for follow-up. She is not on antiestrogen therapy based on the ER/PR negative nature of her ductal carcinoma in situ. Patient knows to call with any concerns.  I would like to take this opportunity to thank you for allowing me to participate in the care of your patient.Armstead Peaks., MD

## 2016-05-10 ENCOUNTER — Ambulatory Visit: Payer: Medicare Other | Admitting: Physician Assistant

## 2016-05-22 ENCOUNTER — Ambulatory Visit (INDEPENDENT_AMBULATORY_CARE_PROVIDER_SITE_OTHER): Payer: Medicare Other | Admitting: Family

## 2016-05-22 ENCOUNTER — Encounter: Payer: Self-pay | Admitting: Family

## 2016-05-22 ENCOUNTER — Ambulatory Visit (INDEPENDENT_AMBULATORY_CARE_PROVIDER_SITE_OTHER): Payer: Medicare Other

## 2016-05-22 VITALS — BP 100/60 | HR 71 | Temp 98.3°F | Ht 67.0 in | Wt 152.2 lb

## 2016-05-22 DIAGNOSIS — J40 Bronchitis, not specified as acute or chronic: Secondary | ICD-10-CM | POA: Diagnosis not present

## 2016-05-22 MED ORDER — CEFDINIR 300 MG PO CAPS: 300.0000 mg | ORAL_CAPSULE | Freq: Two times a day (BID) | ORAL | 0 refills | Status: DC

## 2016-05-22 NOTE — Patient Instructions (Signed)
Chest xray  Increase intake of clear fluids. Congestion is best treated by hydration, when mucus is wetter, it is thinner, less sticky, and easier to expel from the body, either through coughing up drainage, or by blowing your nose.   Get plenty of rest.   Use saline nasal drops and blow your nose frequently. Run a humidifier at night and elevate the head of the bed. Vicks Vapor rub will help with congestion and cough. Steam showers and sinus massage for congestion.   Use Acetaminophen or Ibuprofen as needed for fever or pain. Avoid second hand smoke. Even the smallest exposure will worsen symptoms.   Over the counter medications you can try include Delsym for cough, a decongestant for congestion, and Mucinex or Robitussin as an expectorant. Be sure to just get the plain Mucinex or Robitussin that just has one medication (Guaifenesen). We don't recommend the combination products. Note, be sure to drink two glasses of water with each dose of Mucinex as the medication will not work well without adequate hydration.   You can also try a teaspoon of honey to see if this will help reduce cough. Throat lozenges can sometimes be beneficial as well.    This illness will typically last 7 - 10 days.   Please follow up with our clinic if you develop a fever greater than 101 F, symptoms worsen, or do not resolve in the next week.

## 2016-05-22 NOTE — Progress Notes (Signed)
Pre visit review using our clinic review tool, if applicable. No additional management support is needed unless otherwise documented below in the visit note. 

## 2016-05-22 NOTE — Progress Notes (Signed)
Subjective:    Patient ID: Claudia Little, female    DOB: 05-Apr-1945, 71 y.o.   MRN: [id number]  CC: Claudia Little is a 71 y.o. female who presents today for an acute visit.    HPI: CC: cough x one week, unchanged.  tmax 101.5 today.. Endorses chills, thick sinus congestion, and occacisional wheezing. Cough worse at night. No SOB, CP.   Tried mucinex, robitussin with some relief.   No lung disease Non smoker    HISTORY:  Past Medical History:  Diagnosis Date  . Breast cancer ([place]) 12/2013   radiation  . Chronic anxiety   . Insomnia   . Nephrolithiasis   . Osteoporosis   . PAC (premature atrial contraction)   . Palpitations   . SVT (supraventricular tachycardia) ([facility])    Past Surgical History:  Procedure Laterality Date  . BREAST CYST ASPIRATION Left 10+ yrs ago   neg  . BREAST EXCISIONAL BIOPSY Right 12/15/2013   positive  . BREAST SURGERY Right 12/15/2013   lumpectomy  . BUNIONECTOMY  early '70s  . DILATION AND CURETTAGE OF UTERUS  1973   after miscarriage  . LITHOTRIPSY     Family History  Problem Relation Age of Onset  . Aortic stenosis Mother   . Congestive Heart Failure Mother   . Aortic stenosis Father   . Coronary artery disease Father     Deceased from complications of atherosclerotic CAD and Atherosclerosis  . Congestive Heart Failure Father   . Parkinsonism Brother   . Colon polyps Sister   . Arthritis Maternal Grandfather   . Early death Paternal Grandmother   . Stroke Maternal Grandmother   . Kidney disease Paternal Grandfather   . Breast cancer Maternal Aunt 70  . Ovarian cancer Maternal Aunt     Allergies: Oxycodone Current Outpatient Prescriptions on File Prior to Visit  Medication Sig Dispense Refill  . ALPRAZolam (XANAX) 0.5 MG tablet Take 1 tablet (0.5 mg total) by mouth 2 (two) times daily as needed for anxiety. 30 tablet 5  . atenolol (TENORMIN) 25 MG tablet Take 1 tablet (25 mg total) by mouth daily. 90 tablet 3  . Biotin 10000 MCG  TABS Take 1 tablet by mouth daily.    . Cholecalciferol 1000 UNITS capsule Take 1,000 Units by mouth daily.     . Coenzyme Q10 100 MG TABS Take 1 tablet (100 mg total) by mouth daily. 30 tablet 0  . rosuvastatin (CRESTOR) 5 MG tablet Take 1 tablet (5 mg total) by mouth at bedtime. To take two times a week. 30 tablet 5  . zolpidem (AMBIEN) 5 MG tablet Take 1 tablet (5 mg total) by mouth at bedtime. 90 tablet 3   No current facility-administered medications on file prior to visit.     Social History  Substance Use Topics  . Smoking status: Never Smoker  . Smokeless tobacco: Former Systems developer  . Alcohol use 4.2 oz/week    7 Glasses of wine per week    Review of Systems  Constitutional: Positive for chills and fever.  HENT: Positive for congestion and rhinorrhea. Negative for ear pain, sinus pressure and sore throat.   Respiratory: Positive for cough and wheezing. Negative for shortness of breath.   Cardiovascular: Negative for chest pain and palpitations.  Gastrointestinal: Negative for nausea and vomiting.      Objective:    BP 100/60   Pulse 71   Temp 98.3 F (36.8 C) (Oral)   Ht 5\' 7"  (1.702  m)   Wt 152 lb 3.2 oz (69 kg)   SpO2 97%   BMI 23.84 kg/m    Physical Exam  Constitutional: She appears well-developed and well-nourished.  HENT:  Head: Normocephalic and atraumatic.  Right Ear: Hearing, tympanic membrane, external ear and ear canal normal. No drainage, swelling or tenderness. No foreign bodies. Tympanic membrane is not erythematous and not bulging. No middle ear effusion. No decreased hearing is noted.  Left Ear: Hearing, tympanic membrane, external ear and ear canal normal. No drainage, swelling or tenderness. No foreign bodies. Tympanic membrane is not erythematous and not bulging.  No middle ear effusion. No decreased hearing is noted.  Nose: Rhinorrhea present. Right sinus exhibits no maxillary sinus tenderness and no frontal sinus tenderness. Left sinus exhibits no  maxillary sinus tenderness and no frontal sinus tenderness.  Mouth/Throat: Uvula is midline, oropharynx is clear and moist and mucous membranes are normal. No oropharyngeal exudate, posterior oropharyngeal edema, posterior oropharyngeal erythema or tonsillar abscesses.  Eyes: Conjunctivae are normal.  Cardiovascular: Regular rhythm, normal heart sounds and normal pulses.   Pulmonary/Chest: Effort normal and breath sounds normal. She has no wheezes. She has no rhonchi. She has no rales.  Lymphadenopathy:       Head (right side): No submental, no submandibular, no tonsillar, no preauricular, no posterior auricular and no occipital adenopathy present.       Head (left side): No submental, no submandibular, no tonsillar, no preauricular, no posterior auricular and no occipital adenopathy present.    She has no cervical adenopathy.  Neurological: She is alert.  Skin: Skin is warm and dry.  Psychiatric: She has a normal mood and affect. Her speech is normal and behavior is normal. Thought content normal.  Vitals reviewed.      Assessment & Plan:  1. Bronchitis Afebrile today. No acute respiratory distress. Based on duration of symptoms and fever this morning, patient and I jointly decided to go ahead and start antibiotic. Pending chest x-ray as well.   - cefdinir (OMNICEF) 300 MG capsule; Take 1 capsule (300 mg total) by mouth 2 (two) times daily.  Dispense: 20 capsule; Refill: 0 - DG Chest 2 View     I am having Ms. Gotcher start on cefdinir. I am also having her maintain her Cholecalciferol, atenolol, zolpidem, ALPRAZolam, rosuvastatin, Coenzyme Q10, and Biotin.   Meds ordered this encounter  Medications  . cefdinir (OMNICEF) 300 MG capsule    Sig: Take 1 capsule (300 mg total) by mouth 2 (two) times daily.    Dispense:  20 capsule    Refill:  0    Order Specific Question:   Supervising Provider    Answer:   Crecencio Mc [[id number]]    Return precautions given.   Risks, benefits, and  alternatives of the medications and treatment plan prescribed today were discussed, and patient expressed understanding.   Education regarding symptom management and diagnosis given to patient on AVS.  Continue to follow with Einar Pheasant, MD for routine health maintenance.   Solae P Brawn and I agreed with plan.   Mable Paris, FNP

## 2016-06-04 ENCOUNTER — Other Ambulatory Visit (INDEPENDENT_AMBULATORY_CARE_PROVIDER_SITE_OTHER): Payer: Medicare Other

## 2016-06-04 DIAGNOSIS — D0511 Intraductal carcinoma in situ of right breast: Secondary | ICD-10-CM

## 2016-06-04 DIAGNOSIS — I471 Supraventricular tachycardia, unspecified: Secondary | ICD-10-CM

## 2016-06-04 DIAGNOSIS — E7800 Pure hypercholesterolemia, unspecified: Secondary | ICD-10-CM | POA: Diagnosis not present

## 2016-06-04 LAB — LIPID PANEL
Cholesterol: 159 mg/dL (ref 0–200)
HDL: 44.4 mg/dL (ref >39.00)
LDL Cholesterol: 79 mg/dL (ref 0–99)
NONHDL: 115.02
Total CHOL/HDL Ratio: 4
Triglycerides: 180 mg/dL — ABNORMAL HIGH (ref 0.0–149.0)
VLDL: 36 mg/dL (ref 0.0–40.0)

## 2016-06-04 LAB — CBC WITH DIFFERENTIAL/PLATELET
BASOS PCT: 1.9 % (ref 0.0–3.0)
Basophils Absolute: 0.2 10*3/uL — ABNORMAL HIGH (ref 0.0–0.1)
Eosinophils Absolute: 0.5 10*3/uL (ref 0.0–0.7)
Eosinophils Relative: 5.3 % — ABNORMAL HIGH (ref 0.0–5.0)
HCT: 38.2 % (ref 36.0–46.0)
Hemoglobin: 12.7 g/dL (ref 12.0–15.0)
LYMPHS ABS: 1.5 10*3/uL (ref 0.7–4.0)
Lymphocytes Relative: 17.4 % (ref 12.0–46.0)
MCHC: 33.1 g/dL (ref 30.0–36.0)
MCV: 93.8 fl (ref 78.0–100.0)
MONO ABS: 0.6 10*3/uL (ref 0.1–1.0)
MONOS PCT: 7 % (ref 3.0–12.0)
NEUTROS ABS: 5.9 10*3/uL (ref 1.4–7.7)
NEUTROS PCT: 68.4 % (ref 43.0–77.0)
Platelets: 320 10*3/uL (ref 150.0–400.0)
RBC: 4.07 Mil/uL (ref 3.87–5.11)
RDW: 13.5 % (ref 11.5–15.5)
WBC: 8.6 10*3/uL (ref 4.0–10.5)

## 2016-06-04 LAB — COMPREHENSIVE METABOLIC PANEL
ALBUMIN: 4.3 g/dL (ref 3.5–5.2)
ALT: 19 U/L (ref 0–35)
AST: 20 U/L (ref 0–37)
Alkaline Phosphatase: 64 U/L (ref 39–117)
BILIRUBIN TOTAL: 0.5 mg/dL (ref 0.2–1.2)
BUN: 17 mg/dL (ref 6–23)
CHLORIDE: 101 meq/L (ref 96–112)
CO2: 28 mEq/L (ref 19–32)
CREATININE: 0.9 mg/dL (ref 0.40–1.20)
Calcium: 9.2 mg/dL (ref 8.4–10.5)
GFR: 65.47 mL/min (ref >60.00)
Glucose, Bld: 96 mg/dL (ref 70–99)
Potassium: 4 mEq/L (ref 3.5–5.1)
SODIUM: 138 meq/L (ref 135–145)
TOTAL PROTEIN: 7.6 g/dL (ref 6.0–8.3)

## 2016-06-04 LAB — TSH: TSH: 1.16 u[IU]/mL (ref 0.35–4.50)

## 2016-06-05 ENCOUNTER — Encounter: Payer: Self-pay | Admitting: Internal Medicine

## 2016-06-05 ENCOUNTER — Other Ambulatory Visit (HOSPITAL_COMMUNITY)
Admission: RE | Admit: 2016-06-05 | Discharge: 2016-06-05 | Disposition: A | Discharge status: Home / Self Care | Payer: Medicare Other | Source: Ambulatory Visit | Attending: Internal Medicine | Admitting: Internal Medicine

## 2016-06-05 ENCOUNTER — Ambulatory Visit (INDEPENDENT_AMBULATORY_CARE_PROVIDER_SITE_OTHER): Payer: Medicare Other | Admitting: Internal Medicine

## 2016-06-05 VITALS — BP 104/58 | HR 61 | Temp 98.6°F | Resp 16 | Ht 67.0 in | Wt 152.0 lb

## 2016-06-05 DIAGNOSIS — Z124 Encounter for screening for malignant neoplasm of cervix: Secondary | ICD-10-CM

## 2016-06-05 DIAGNOSIS — G4700 Insomnia, unspecified: Secondary | ICD-10-CM

## 2016-06-05 DIAGNOSIS — I471 Supraventricular tachycardia: Secondary | ICD-10-CM | POA: Diagnosis not present

## 2016-06-05 DIAGNOSIS — Z01419 Encounter for gynecological examination (general) (routine) without abnormal findings: Secondary | ICD-10-CM | POA: Diagnosis present

## 2016-06-05 DIAGNOSIS — F419 Anxiety disorder, unspecified: Secondary | ICD-10-CM

## 2016-06-05 DIAGNOSIS — E7800 Pure hypercholesterolemia, unspecified: Secondary | ICD-10-CM

## 2016-06-05 DIAGNOSIS — Z8742 Personal history of other diseases of the female genital tract: Secondary | ICD-10-CM

## 2016-06-05 DIAGNOSIS — D0511 Intraductal carcinoma in situ of right breast: Secondary | ICD-10-CM

## 2016-06-05 DIAGNOSIS — Z0000 Encounter for general adult medical examination without abnormal findings: Secondary | ICD-10-CM

## 2016-06-05 DIAGNOSIS — E2839 Other primary ovarian failure: Secondary | ICD-10-CM | POA: Diagnosis not present

## 2016-06-05 DIAGNOSIS — R748 Abnormal levels of other serum enzymes: Secondary | ICD-10-CM

## 2016-06-05 DIAGNOSIS — M8580 Other specified disorders of bone density and structure, unspecified site: Secondary | ICD-10-CM

## 2016-06-05 DIAGNOSIS — Z87898 Personal history of other specified conditions: Secondary | ICD-10-CM | POA: Diagnosis not present

## 2016-06-05 DIAGNOSIS — Z1151 Encounter for screening for human papillomavirus (HPV): Secondary | ICD-10-CM | POA: Insufficient documentation

## 2016-06-05 DIAGNOSIS — Z86010 Personal history of colonic polyps: Secondary | ICD-10-CM

## 2016-06-05 NOTE — Progress Notes (Signed)
Pre-visit discussion using our clinic review tool. No additional management support is needed unless otherwise documented below in the visit note.  

## 2016-06-05 NOTE — Progress Notes (Signed)
Patient ID: Claudia Little, female   DOB: 1946/03/05, 71 y.o.   MRN: [id number]   Subjective:    Patient ID: Claudia Little, female    DOB: May 29, 1945, 71 y.o.   MRN: [id number]  HPI  Patient here for her physical exam.  She is doing well.  Recently had bronchitis.  Treated with omnicef.  Better.  No increased cough or congestion now.  No chest pain.  Stays active.  No sob.  No acid reflux.  No abdominal pain.  Bowels stable.  Pt request bone density.     Past Medical History:  Diagnosis Date  . Breast cancer ([place]) 12/2013   radiation  . Chronic anxiety   . Insomnia   . Nephrolithiasis   . Osteoporosis   . PAC (premature atrial contraction)   . Palpitations   . SVT (supraventricular tachycardia) ([facility])    Past Surgical History:  Procedure Laterality Date  . BREAST CYST ASPIRATION Left 10+ yrs ago   neg  . BREAST EXCISIONAL BIOPSY Right 12/15/2013   positive  . BREAST SURGERY Right 12/15/2013   lumpectomy  . BUNIONECTOMY  early '70s  . DILATION AND CURETTAGE OF UTERUS  1973   after miscarriage  . LITHOTRIPSY     Family History  Problem Relation Age of Onset  . Aortic stenosis Mother   . Congestive Heart Failure Mother   . Aortic stenosis Father   . Coronary artery disease Father     Deceased from complications of atherosclerotic CAD and Atherosclerosis  . Congestive Heart Failure Father   . Parkinsonism Brother   . Colon polyps Sister   . Arthritis Maternal Grandfather   . Early death Paternal Grandmother   . Stroke Maternal Grandmother   . Kidney disease Paternal Grandfather   . Breast cancer Maternal Aunt 70  . Ovarian cancer Maternal Aunt    Social History   Social History  . Marital status: Married    Spouse name: Richard  . Number of children: 1  . Years of education: HS   Occupational History  . Retired    Social History Main Topics  . Smoking status: Never Smoker  . Smokeless tobacco: Former Systems developer  . Alcohol use 4.2 oz/week    7 Glasses of wine per  week  . Drug use: No  . Sexual activity: Not Asked   Other Topics Concern  . None   Social History Narrative  . None    Outpatient Encounter Prescriptions as of 06/05/2016  Medication Sig  . ALPRAZolam (XANAX) 0.5 MG tablet Take 1 tablet (0.5 mg total) by mouth 2 (two) times daily as needed for anxiety.  Marland Kitchen atenolol (TENORMIN) 25 MG tablet Take 1 tablet (25 mg total) by mouth daily.  . Biotin 10000 MCG TABS Take 1 tablet by mouth daily.  . Cholecalciferol 1000 UNITS capsule Take 1,000 Units by mouth daily.   . Coenzyme Q10 100 MG TABS Take 1 tablet (100 mg total) by mouth daily.  . rosuvastatin (CRESTOR) 5 MG tablet Take 1 tablet (5 mg total) by mouth at bedtime. To take two times a week.  . zolpidem (AMBIEN) 5 MG tablet Take 1 tablet (5 mg total) by mouth at bedtime.  . [DISCONTINUED] cefdinir (OMNICEF) 300 MG capsule Take 1 capsule (300 mg total) by mouth 2 (two) times daily. (Patient not taking: Reported on 06/05/2016)   No facility-administered encounter medications on file as of 06/05/2016.     Review of Systems  Constitutional: Negative for appetite  change and unexpected weight change.  HENT: Negative for congestion and sinus pressure.   Eyes: Negative for pain and visual disturbance.  Respiratory: Negative for cough, chest tightness and shortness of breath.   Cardiovascular: Negative for chest pain, palpitations and leg swelling.  Gastrointestinal: Negative for abdominal pain, diarrhea, nausea and vomiting.  Genitourinary: Negative for difficulty urinating and dysuria.  Musculoskeletal: Negative for back pain and joint swelling.  Skin: Negative for color change and rash.  Neurological: Negative for dizziness, light-headedness and headaches.  Hematological: Negative for adenopathy. Does not bruise/bleed easily.  Psychiatric/Behavioral: Negative for agitation and dysphoric mood.       Objective:    Physical Exam  Constitutional: She is oriented to person, place, and time.  She appears well-developed and well-nourished. No distress.  HENT:  Nose: Nose normal.  Mouth/Throat: Oropharynx is clear and moist.  Eyes: Right eye exhibits no discharge. Left eye exhibits no discharge. No scleral icterus.  Neck: Neck supple. No thyromegaly present.  Cardiovascular: Normal rate and regular rhythm.   Pulmonary/Chest: Breath sounds normal. No accessory muscle usage. No tachypnea. No respiratory distress. She has no decreased breath sounds. She has no wheezes. She has no rhonchi. Right breast exhibits no inverted nipple, no mass, no nipple discharge and no tenderness (no axillary adenopathy). Left breast exhibits no inverted nipple, no mass, no nipple discharge and no tenderness (no axilarry adenopathy).  Abdominal: Soft. Bowel sounds are normal. There is no tenderness.  Genitourinary:  Genitourinary Comments: Normal external genitalia.  Vaginal vault without lesions.  Cervix identified.  Pap smear performed.  Could not appreciate any adnexal masses or tenderness.    Musculoskeletal: She exhibits no edema or tenderness.  Lymphadenopathy:    She has no cervical adenopathy.  Neurological: She is alert and oriented to person, place, and time.  Skin: Skin is warm. No rash noted. No erythema.  Psychiatric: She has a normal mood and affect. Her behavior is normal.    BP (!) 104/58 (BP Location: Left Arm, Patient Position: Sitting, Cuff Size: Large)   Pulse 61   Temp 98.6 F (37 C) (Oral)   Resp 16   Ht 5\' 7"  (1.702 m)   Wt 152 lb (68.9 kg)   SpO2 94%   BMI 23.81 kg/m  Wt Readings from Last 3 Encounters:  06/05/16 152 lb (68.9 kg)  05/22/16 152 lb 3.2 oz (69 kg)  03/13/16 149 lb 2.3 oz (67.7 kg)     Lab Results  Component Value Date   WBC 8.6 06/04/2016   HGB 12.7 06/04/2016   HCT 38.2 06/04/2016   PLT 320.0 06/04/2016   GLUCOSE 96 06/04/2016   CHOL 159 06/04/2016   TRIG 180.0 (H) 06/04/2016   HDL 44.40 06/04/2016   LDLCALC 79 06/04/2016   ALT 19 06/04/2016    AST 20 06/04/2016   NA 138 06/04/2016   K 4.0 06/04/2016   CL 101 06/04/2016   CREATININE 0.90 06/04/2016   BUN 17 06/04/2016   CO2 28 06/04/2016   TSH 1.16 06/04/2016       Assessment & Plan:   Problem List Items Addressed This Visit    Abnormal liver enzymes    06/04/16 liver function tests are wnl.       Anxiety    Doing well on current regimen.  Follow.        Ductal carcinoma in situ (DCIS) of right breast    Right breast DCIS s/p lumpectomy.  Also s/p XRT.  Is ER/PR positive.  Mammogram 11/21/15 - Birads II.        History of abnormal cervical Pap smear    Repeat pap 03/2015 - ok.  Repeat pap today.        History of colon polyps    Last colonoscopy 02/2012.  Due f/u 5 years.        Hypercholesteremia    Low cholesterol diet and exercise.  On low dose crestor.  Follow.        Relevant Orders   Comprehensive metabolic panel   Lipid panel   Insomnia    ambien prn.        Osteopenia    Pt request f/u bone density.  Has been > 2 years.        Supraventricular tachycardia ([place])    Since being on atenolol has done well.  No problmes.  Previously saw Dr Caryl Comes.         Other Visit Diagnoses    Routine general medical examination at a health care facility    -  Primary   Estrogen deficiency       Relevant Orders   DG Bone Density   Cervical cancer screening       Relevant Orders   Cytology - PAP (Completed)       Einar Pheasant, MD

## 2016-06-06 ENCOUNTER — Encounter: Payer: Self-pay | Admitting: Internal Medicine

## 2016-06-06 LAB — CYTOLOGY - PAP
Diagnosis: NEGATIVE
HPV: NOT DETECTED

## 2016-06-10 ENCOUNTER — Encounter: Payer: Self-pay | Admitting: Internal Medicine

## 2016-06-10 NOTE — Assessment & Plan Note (Signed)
06/04/16 liver function tests are wnl.

## 2016-06-10 NOTE — Assessment & Plan Note (Signed)
Repeat pap 03/2015 - ok.  Repeat pap today.

## 2016-06-10 NOTE — Assessment & Plan Note (Signed)
Last colonoscopy 02/2012.  Due f/u 5 years.

## 2016-06-10 NOTE — Assessment & Plan Note (Signed)
ambien prn.

## 2016-06-10 NOTE — Assessment & Plan Note (Signed)
Low cholesterol diet and exercise.  On low dose crestor.  Follow.

## 2016-06-10 NOTE — Assessment & Plan Note (Signed)
Doing well on current regimen.  Follow.   

## 2016-06-10 NOTE — Assessment & Plan Note (Signed)
Pt request f/u bone density.  Has been > 2 years.

## 2016-06-10 NOTE — Assessment & Plan Note (Signed)
Since being on atenolol has done well.  No problmes.  Previously saw Dr Caryl Comes.

## 2016-06-10 NOTE — Assessment & Plan Note (Signed)
Right breast DCIS s/p lumpectomy.  Also s/p XRT.  Is ER/PR positive.  Mammogram 11/21/15 - Birads II.

## 2016-07-31 ENCOUNTER — Ambulatory Visit: Payer: Medicare Other

## 2016-07-31 ENCOUNTER — Ambulatory Visit
Admission: RE | Admit: 2016-07-31 | Discharge: 2016-07-31 | Disposition: A | Discharge status: Home / Self Care | Payer: Medicare Other | Source: Ambulatory Visit | Attending: Internal Medicine | Admitting: Internal Medicine

## 2016-07-31 DIAGNOSIS — M8588 Other specified disorders of bone density and structure, other site: Secondary | ICD-10-CM | POA: Diagnosis not present

## 2016-07-31 DIAGNOSIS — E2839 Other primary ovarian failure: Secondary | ICD-10-CM

## 2016-08-02 ENCOUNTER — Encounter: Payer: Self-pay | Admitting: Internal Medicine

## 2016-09-07 ENCOUNTER — Other Ambulatory Visit: Payer: Self-pay | Admitting: Internal Medicine

## 2016-09-07 DIAGNOSIS — E7800 Pure hypercholesterolemia, unspecified: Secondary | ICD-10-CM

## 2016-09-07 NOTE — Telephone Encounter (Signed)
You have not given before. Not sure how you want it written. Please advisee

## 2016-09-08 NOTE — Telephone Encounter (Signed)
rx ok'd for crestor.

## 2016-10-12 ENCOUNTER — Other Ambulatory Visit: Payer: Self-pay | Admitting: Internal Medicine

## 2016-10-12 DIAGNOSIS — I471 Supraventricular tachycardia: Secondary | ICD-10-CM

## 2016-10-16 ENCOUNTER — Telehealth: Payer: Self-pay | Admitting: *Deleted

## 2016-10-16 DIAGNOSIS — Z1239 Encounter for other screening for malignant neoplasm of breast: Secondary | ICD-10-CM

## 2016-10-16 NOTE — Telephone Encounter (Signed)
Patient has requested a order for her mammogram  Pt requested a call when this is placed Pt contact [phone]

## 2016-10-16 NOTE — Telephone Encounter (Signed)
Left message to return call to our office. Need to know where she would like to go.

## 2016-10-16 NOTE — Telephone Encounter (Signed)
She does need diagnostic bilateral mammogram with bilateral ultrasound.  I do not have the new code for diagnostic mammogram.  I need to get from you.

## 2016-10-16 NOTE — Telephone Encounter (Signed)
Pt requested to go to [facility] breast

## 2016-10-16 NOTE — Telephone Encounter (Signed)
Just wanted to check and see if she needs diagnostic with u/s. Or screening mammogram

## 2016-10-17 NOTE — Telephone Encounter (Signed)
App made 11/23/16 at 11:20 need to call pt an let her know

## 2016-10-17 NOTE — Telephone Encounter (Signed)
Called patient gave all information with contact numbers.

## 2016-11-23 ENCOUNTER — Ambulatory Visit
Admission: RE | Admit: 2016-11-23 | Discharge: 2016-11-23 | Disposition: A | Discharge status: Home / Self Care | Payer: Medicare Other | Source: Ambulatory Visit | Attending: Internal Medicine | Admitting: Internal Medicine

## 2016-11-23 DIAGNOSIS — Z1239 Encounter for other screening for malignant neoplasm of breast: Secondary | ICD-10-CM

## 2016-11-23 DIAGNOSIS — Z853 Personal history of malignant neoplasm of breast: Secondary | ICD-10-CM | POA: Insufficient documentation

## 2016-11-26 ENCOUNTER — Encounter: Payer: Self-pay | Admitting: Internal Medicine

## 2016-11-26 DIAGNOSIS — Z0000 Encounter for general adult medical examination without abnormal findings: Secondary | ICD-10-CM | POA: Insufficient documentation

## 2016-12-07 ENCOUNTER — Other Ambulatory Visit: Payer: Self-pay | Admitting: Internal Medicine

## 2016-12-07 DIAGNOSIS — E7800 Pure hypercholesterolemia, unspecified: Secondary | ICD-10-CM

## 2016-12-11 ENCOUNTER — Other Ambulatory Visit (INDEPENDENT_AMBULATORY_CARE_PROVIDER_SITE_OTHER): Payer: Medicare Other

## 2016-12-11 DIAGNOSIS — E7800 Pure hypercholesterolemia, unspecified: Secondary | ICD-10-CM | POA: Diagnosis not present

## 2016-12-11 LAB — COMPREHENSIVE METABOLIC PANEL
ALBUMIN: 4.5 g/dL (ref 3.5–5.2)
ALK PHOS: 56 U/L (ref 39–117)
ALT: 18 U/L (ref 0–35)
AST: 18 U/L (ref 0–37)
BUN: 13 mg/dL (ref 6–23)
CHLORIDE: 102 meq/L (ref 96–112)
CO2: 28 mEq/L (ref 19–32)
Calcium: 9.3 mg/dL (ref 8.4–10.5)
Creatinine, Ser: 0.9 mg/dL (ref 0.40–1.20)
GFR: 65.37 mL/min (ref >60.00)
Glucose, Bld: 104 mg/dL — ABNORMAL HIGH (ref 70–99)
POTASSIUM: 4.1 meq/L (ref 3.5–5.1)
Sodium: 139 mEq/L (ref 135–145)
TOTAL PROTEIN: 7.2 g/dL (ref 6.0–8.3)
Total Bilirubin: 0.6 mg/dL (ref 0.2–1.2)

## 2016-12-11 LAB — LDL CHOLESTEROL, DIRECT: Direct LDL: 92 mg/dL

## 2016-12-11 LAB — LIPID PANEL
Cholesterol: 170 mg/dL (ref 0–200)
HDL: 49.5 mg/dL (ref >39.00)
NonHDL: 120.92
Total CHOL/HDL Ratio: 3
Triglycerides: 234 mg/dL — ABNORMAL HIGH (ref 0.0–149.0)
VLDL: 46.8 mg/dL — AB (ref 0.0–40.0)

## 2016-12-12 ENCOUNTER — Encounter: Payer: Self-pay | Admitting: Internal Medicine

## 2016-12-13 ENCOUNTER — Encounter: Payer: Self-pay | Admitting: Internal Medicine

## 2016-12-13 ENCOUNTER — Ambulatory Visit (INDEPENDENT_AMBULATORY_CARE_PROVIDER_SITE_OTHER): Payer: Medicare Other | Admitting: Internal Medicine

## 2016-12-13 DIAGNOSIS — I471 Supraventricular tachycardia: Secondary | ICD-10-CM

## 2016-12-13 DIAGNOSIS — Z86010 Personal history of colonic polyps: Secondary | ICD-10-CM

## 2016-12-13 DIAGNOSIS — M8580 Other specified disorders of bone density and structure, unspecified site: Secondary | ICD-10-CM | POA: Diagnosis not present

## 2016-12-13 DIAGNOSIS — Z1159 Encounter for screening for other viral diseases: Secondary | ICD-10-CM

## 2016-12-13 DIAGNOSIS — Z853 Personal history of malignant neoplasm of breast: Secondary | ICD-10-CM

## 2016-12-13 DIAGNOSIS — Z8742 Personal history of other diseases of the female genital tract: Secondary | ICD-10-CM

## 2016-12-13 DIAGNOSIS — Z87898 Personal history of other specified conditions: Secondary | ICD-10-CM

## 2016-12-13 DIAGNOSIS — E7800 Pure hypercholesterolemia, unspecified: Secondary | ICD-10-CM

## 2016-12-13 DIAGNOSIS — Z23 Encounter for immunization: Secondary | ICD-10-CM

## 2016-12-13 MED ORDER — ROSUVASTATIN CALCIUM 5 MG PO TABS: 5.0000 mg | ORAL_TABLET | Freq: Every day | ORAL | 1 refills | Status: DC

## 2016-12-13 MED ORDER — TETANUS-DIPHTH-ACELL PERTUSSIS 5-2.5-18.5 LF-MCG/0.5 IM SUSP: 0.5000 mL | Freq: Once | INTRAMUSCULAR | 0 refills | Status: DC

## 2016-12-13 MED ORDER — CHOLECALCIFEROL 25 MCG (1000 UT) PO CAPS: 1000.0000 [IU] | ORAL_CAPSULE | Freq: Every day | ORAL | Status: DC

## 2016-12-13 NOTE — Patient Instructions (Signed)
Tums - can take 1-2 per day

## 2016-12-13 NOTE — Progress Notes (Addendum)
Patient ID: Claudia Little, female   DOB: Mar 08, 1946, 71 y.o.   MRN: [id number]   Subjective:    Patient ID: Claudia Little, female    DOB: May 19, 1945, 71 y.o.   MRN: [id number]  HPI  Patient here for a scheduled follow up. She reports she is doing well.  Stays active.  No chest pain.  No sob.  No acid reflux.  No abdominal pain.  Bowels moving.  Had questions about immunizations.  Also discussed her labs.  Discussed crestor.  Discussed bone density results and dietary calcium.  Discussed weight bearing exercise.     Past Medical History:  Diagnosis Date  . Breast cancer ([place]) 12/2013   rright breast cancer/adiation  . Chronic anxiety   . Insomnia   . Nephrolithiasis   . Osteoporosis   . PAC (premature atrial contraction)   . Palpitations   . SVT (supraventricular tachycardia) ([facility])    Past Surgical History:  Procedure Laterality Date  . BREAST CYST ASPIRATION Left 10+ yrs ago   neg  . BREAST EXCISIONAL BIOPSY Right 12/15/2013   positive  . BREAST SURGERY Right 12/15/2013   lumpectomy  . BUNIONECTOMY  early '70s  . DILATION AND CURETTAGE OF UTERUS  1973   after miscarriage  . LITHOTRIPSY     Family History  Problem Relation Age of Onset  . Aortic stenosis Mother   . Congestive Heart Failure Mother   . Aortic stenosis Father   . Coronary artery disease Father        Deceased from complications of atherosclerotic CAD and Atherosclerosis  . Congestive Heart Failure Father   . Parkinsonism Brother   . Colon polyps Sister   . Arthritis Maternal Grandfather   . Early death Paternal Grandmother   . Stroke Maternal Grandmother   . Kidney disease Paternal Grandfather   . Breast cancer Maternal Aunt 70  . Ovarian cancer Maternal Aunt    Social History   Social History  . Marital status: Married    Spouse name: Richard  . Number of children: 1  . Years of education: HS   Occupational History  . Retired    Social History Main Topics  . Smoking status: Never Smoker  .  Smokeless tobacco: Former Systems developer  . Alcohol use 4.2 oz/week    7 Glasses of wine per week  . Drug use: No  . Sexual activity: Not Asked   Other Topics Concern  . None   Social History Narrative  . None    Outpatient Encounter Prescriptions as of 12/13/2016  Medication Sig  . atenolol (TENORMIN) 25 MG tablet TAKE ONE TABLET BY MOUTH EVERY DAY  . Biotin 10000 MCG TABS Take 1 tablet by mouth daily.  . Coenzyme Q10 100 MG TABS Take 1 tablet (100 mg total) by mouth daily.  . rosuvastatin (CRESTOR) 5 MG tablet Take 1 tablet (5 mg total) by mouth daily.  . [DISCONTINUED] rosuvastatin (CRESTOR) 5 MG tablet ONE TABLET BY MOUTH TWICE WEEKLY AT BEDTIME  . [DISCONTINUED] rosuvastatin (CRESTOR) 5 MG tablet Take 1 tablet (5 mg total) by mouth daily.  . [DISCONTINUED] ALPRAZolam (XANAX) 0.5 MG tablet Take 1 tablet (0.5 mg total) by mouth 2 (two) times daily as needed for anxiety.  . [DISCONTINUED] Cholecalciferol 1000 UNITS capsule Take 1,000 Units by mouth daily.   . [DISCONTINUED] Tdap (BOOSTRIX) 5-2.5-18.5 LF-MCG/0.5 injection Inject 0.5 mLs into the muscle once.  . [DISCONTINUED] zolpidem (AMBIEN) 5 MG tablet Take 1 tablet (5 mg total)  by mouth at bedtime.   No facility-administered encounter medications on file as of 12/13/2016.     Review of Systems  Constitutional: Negative for appetite change and unexpected weight change.  HENT: Negative for congestion and sinus pressure.   Respiratory: Negative for cough, chest tightness and shortness of breath.   Cardiovascular: Negative for chest pain, palpitations and leg swelling.  Gastrointestinal: Negative for abdominal pain, diarrhea, nausea and vomiting.  Genitourinary: Negative for difficulty urinating and dysuria.  Musculoskeletal: Negative for back pain and joint swelling.  Skin: Negative for color change and rash.  Neurological: Negative for dizziness, light-headedness and headaches.  Psychiatric/Behavioral: Negative for agitation and  dysphoric mood.       Objective:     Blood pressure rechecked by me:  122/72  Physical Exam  Constitutional: She appears well-developed and well-nourished. No distress.  HENT:  Nose: Nose normal.  Mouth/Throat: Oropharynx is clear and moist.  Neck: Neck supple. No thyromegaly present.  Cardiovascular: Normal rate and regular rhythm.   Pulmonary/Chest: Breath sounds normal. No respiratory distress. She has no wheezes.  Abdominal: Soft. Bowel sounds are normal. There is no tenderness.  Musculoskeletal: She exhibits no edema or tenderness.  Lymphadenopathy:    She has no cervical adenopathy.  Skin: No rash noted. No erythema.  Psychiatric: She has a normal mood and affect. Her behavior is normal.    BP 122/72   Pulse 64   Temp 97.9 F (36.6 C) (Oral)   Resp 16   Ht 5' 6.93" (1.7 m)   Wt 155 lb 9.6 oz (70.6 kg)   SpO2 98%   BMI 24.42 kg/m  Wt Readings from Last 3 Encounters:  12/13/16 155 lb 9.6 oz (70.6 kg)  06/05/16 152 lb (68.9 kg)  05/22/16 152 lb 3.2 oz (69 kg)     Lab Results  Component Value Date   WBC 8.6 06/04/2016   HGB 12.7 06/04/2016   HCT 38.2 06/04/2016   PLT 320.0 06/04/2016   GLUCOSE 104 (H) 12/11/2016   CHOL 170 12/11/2016   TRIG 234.0 (H) 12/11/2016   HDL 49.50 12/11/2016   LDLDIRECT 92.0 12/11/2016   LDLCALC 79 06/04/2016   ALT 18 12/11/2016   AST 18 12/11/2016   NA 139 12/11/2016   K 4.1 12/11/2016   CL 102 12/11/2016   CREATININE 0.90 12/11/2016   BUN 13 12/11/2016   CO2 28 12/11/2016   TSH 1.16 06/04/2016    Mm Diag Breast Tomo Bilateral  Result Date: 11/23/2016 CLINICAL DATA:  History of right breast cancer, diagnosed in 2015. Annual examination. EXAM: 2D DIGITAL DIAGNOSTIC BILATERAL MAMMOGRAM WITH CAD AND ADJUNCT TOMO COMPARISON:  Previous exam(s). ACR Breast Density Category c: The breast tissue is heterogeneously dense, which may obscure small masses. FINDINGS: There are stable lumpectomy changes in the upper central right breast  and a stable parenchymal pattern bilaterally. No mass, nonsurgical distortion, or suspicious microcalcification is identified in either breast to suggest malignancy. Mammographic images were processed with CAD. IMPRESSION: Stable lumpectomy changes on the right. No evidence of malignancy in either breast. RECOMMENDATION: Diagnostic mammogram is suggested in 1 year. (Code:[id number]-01Y) I have discussed the findings and recommendations with the patient. Results were also provided in writing at the conclusion of the visit. If applicable, a reminder letter will be sent to the patient regarding the next appointment. BI-RADS CATEGORY  2: Benign. Electronically Signed   By: Curlene Dolphin M.D.   On: 11/23/2016 11:32       Assessment & Plan:  Problem List Items Addressed This Visit    History of abnormal cervical Pap smear    PAP 06/15/16 - negative with negative HPV.        History of breast cancer    Mammogram 11/23/16 - Birads II.        History of colon polyps    Last colonoscopy 02/2012.  Due f/u colonoscopy.        Relevant Orders   Ambulatory referral to Gastroenterology   Hypercholesteremia    Discussed cholesterol results.  Crestor.  Follow lipid panel and liver function tests.        Relevant Medications   rosuvastatin (CRESTOR) 5 MG tablet   Other Relevant Orders   CBC with Differential/Platelet   Comprehensive metabolic panel   Lipid panel   TSH   Osteopenia    Discussed bone density results.  Discussed dietary calcium and weight bearing exercise.        Relevant Orders   VITAMIN D 25 Hydroxy (Vit-D Deficiency, Fractures)   Supraventricular tachycardia ([facility])    Saw Dr Caryl Comes.  S/p holter and event monitor.  On atenolol.  Stable.       Relevant Medications   rosuvastatin (CRESTOR) 5 MG tablet    Other Visit Diagnoses    Need for diphtheria-tetanus-pertussis (Tdap) vaccine       Need for hepatitis C screening test       Relevant Orders   Hepatitis C antibody        Einar Pheasant, MD

## 2016-12-15 ENCOUNTER — Encounter: Payer: Self-pay | Admitting: Internal Medicine

## 2016-12-15 NOTE — Assessment & Plan Note (Signed)
Discussed cholesterol results.  Crestor.  Follow lipid panel and liver function tests.

## 2016-12-15 NOTE — Assessment & Plan Note (Signed)
Last colonoscopy 02/2012.  Due f/u colonoscopy.

## 2016-12-15 NOTE — Addendum Note (Signed)
Addended by: Alisa Graff on: 12/15/2016 11:34 AM   Modules accepted: Orders

## 2016-12-15 NOTE — Assessment & Plan Note (Signed)
Mammogram 11/23/16 - Birads II.

## 2016-12-15 NOTE — Assessment & Plan Note (Signed)
PAP 06/15/16 - negative with negative HPV.

## 2016-12-15 NOTE — Assessment & Plan Note (Signed)
Saw Dr Caryl Comes.  S/p holter and event monitor.  On atenolol.  Stable.

## 2016-12-15 NOTE — Assessment & Plan Note (Signed)
Discussed bone density results.  Discussed dietary calcium and weight bearing exercise.

## 2017-01-25 ENCOUNTER — Other Ambulatory Visit: Payer: Self-pay | Admitting: Internal Medicine

## 2017-01-25 DIAGNOSIS — I471 Supraventricular tachycardia: Secondary | ICD-10-CM

## 2017-04-10 ENCOUNTER — Other Ambulatory Visit: Payer: Self-pay | Admitting: Internal Medicine

## 2017-04-10 DIAGNOSIS — E7800 Pure hypercholesterolemia, unspecified: Secondary | ICD-10-CM

## 2017-04-15 ENCOUNTER — Other Ambulatory Visit: Payer: Medicare Other

## 2017-04-16 ENCOUNTER — Other Ambulatory Visit (INDEPENDENT_AMBULATORY_CARE_PROVIDER_SITE_OTHER): Payer: Medicare Other

## 2017-04-16 DIAGNOSIS — Z1159 Encounter for screening for other viral diseases: Secondary | ICD-10-CM | POA: Diagnosis not present

## 2017-04-16 DIAGNOSIS — E7800 Pure hypercholesterolemia, unspecified: Secondary | ICD-10-CM

## 2017-04-16 DIAGNOSIS — M8580 Other specified disorders of bone density and structure, unspecified site: Secondary | ICD-10-CM

## 2017-04-16 LAB — CBC WITH DIFFERENTIAL/PLATELET
BASOS ABS: 0.1 10*3/uL (ref 0.0–0.1)
Basophils Relative: 0.9 % (ref 0.0–3.0)
Eosinophils Absolute: 0.5 10*3/uL (ref 0.0–0.7)
Eosinophils Relative: 7.2 % — ABNORMAL HIGH (ref 0.0–5.0)
HCT: 40.4 % (ref 36.0–46.0)
Hemoglobin: 13.5 g/dL (ref 12.0–15.0)
LYMPHS ABS: 1.6 10*3/uL (ref 0.7–4.0)
Lymphocytes Relative: 23.7 % (ref 12.0–46.0)
MCHC: 33.4 g/dL (ref 30.0–36.0)
MCV: 94.8 fl (ref 78.0–100.0)
MONO ABS: 0.6 10*3/uL (ref 0.1–1.0)
Monocytes Relative: 9.3 % (ref 3.0–12.0)
NEUTROS PCT: 58.9 % (ref 43.0–77.0)
Neutro Abs: 4 10*3/uL (ref 1.4–7.7)
Platelets: 250 10*3/uL (ref 150.0–400.0)
RBC: 4.26 Mil/uL (ref 3.87–5.11)
RDW: 13.3 % (ref 11.5–15.5)
WBC: 6.7 10*3/uL (ref 4.0–10.5)

## 2017-04-16 LAB — COMPREHENSIVE METABOLIC PANEL
ALK PHOS: 66 U/L (ref 39–117)
ALT: 14 U/L (ref 0–35)
AST: 14 U/L (ref 0–37)
Albumin: 4.5 g/dL (ref 3.5–5.2)
BILIRUBIN TOTAL: 0.6 mg/dL (ref 0.2–1.2)
BUN: 20 mg/dL (ref 6–23)
CO2: 30 mEq/L (ref 19–32)
CREATININE: 0.87 mg/dL (ref 0.40–1.20)
Calcium: 9.5 mg/dL (ref 8.4–10.5)
Chloride: 101 mEq/L (ref 96–112)
GFR: 67.91 mL/min (ref >60.00)
GLUCOSE: 107 mg/dL — AB (ref 70–99)
Potassium: 4 mEq/L (ref 3.5–5.1)
SODIUM: 138 meq/L (ref 135–145)
TOTAL PROTEIN: 7.6 g/dL (ref 6.0–8.3)

## 2017-04-16 LAB — LIPID PANEL
Cholesterol: 151 mg/dL (ref 0–200)
HDL: 51.6 mg/dL (ref >39.00)
LDL Cholesterol: 66 mg/dL (ref 0–99)
NONHDL: 99.49
Total CHOL/HDL Ratio: 3
Triglycerides: 169 mg/dL — ABNORMAL HIGH (ref 0.0–149.0)
VLDL: 33.8 mg/dL (ref 0.0–40.0)

## 2017-04-16 LAB — VITAMIN D 25 HYDROXY (VIT D DEFICIENCY, FRACTURES): VITD: 34.34 ng/mL (ref 30.00–100.00)

## 2017-04-16 LAB — TSH: TSH: 1.76 u[IU]/mL (ref 0.35–4.50)

## 2017-04-17 ENCOUNTER — Ambulatory Visit: Payer: Medicare Other | Admitting: Internal Medicine

## 2017-04-17 ENCOUNTER — Other Ambulatory Visit: Payer: Self-pay

## 2017-04-17 ENCOUNTER — Other Ambulatory Visit (INDEPENDENT_AMBULATORY_CARE_PROVIDER_SITE_OTHER): Payer: Medicare Other

## 2017-04-17 ENCOUNTER — Encounter: Payer: Self-pay | Admitting: Internal Medicine

## 2017-04-17 ENCOUNTER — Other Ambulatory Visit: Payer: Self-pay | Admitting: Internal Medicine

## 2017-04-17 ENCOUNTER — Encounter: Payer: Self-pay | Admitting: Radiation Oncology

## 2017-04-17 ENCOUNTER — Ambulatory Visit
Admission: RE | Admit: 2017-04-17 | Discharge: 2017-04-17 | Disposition: A | Discharge status: Home / Self Care | Payer: Medicare Other | Source: Ambulatory Visit | Attending: Radiation Oncology | Admitting: Radiation Oncology

## 2017-04-17 VITALS — BP 130/68 | HR 55 | Temp 97.3°F | Resp 20 | Wt 153.2 lb

## 2017-04-17 VITALS — BP 126/60 | HR 62 | Temp 98.1°F | Ht 66.0 in | Wt 154.0 lb

## 2017-04-17 DIAGNOSIS — Z86000 Personal history of in-situ neoplasm of breast: Secondary | ICD-10-CM | POA: Diagnosis not present

## 2017-04-17 DIAGNOSIS — Z923 Personal history of irradiation: Secondary | ICD-10-CM | POA: Diagnosis not present

## 2017-04-17 DIAGNOSIS — E7800 Pure hypercholesterolemia, unspecified: Secondary | ICD-10-CM | POA: Diagnosis not present

## 2017-04-17 DIAGNOSIS — Z86010 Personal history of colonic polyps: Secondary | ICD-10-CM

## 2017-04-17 DIAGNOSIS — R739 Hyperglycemia, unspecified: Secondary | ICD-10-CM

## 2017-04-17 DIAGNOSIS — I471 Supraventricular tachycardia: Secondary | ICD-10-CM | POA: Diagnosis not present

## 2017-04-17 DIAGNOSIS — D0511 Intraductal carcinoma in situ of right breast: Secondary | ICD-10-CM

## 2017-04-17 DIAGNOSIS — Z853 Personal history of malignant neoplasm of breast: Secondary | ICD-10-CM

## 2017-04-17 DIAGNOSIS — R748 Abnormal levels of other serum enzymes: Secondary | ICD-10-CM | POA: Diagnosis not present

## 2017-04-17 LAB — HEPATITIS C ANTIBODY
Hepatitis C Ab: NONREACTIVE
SIGNAL TO CUT-OFF: 0.01 (ref <1.00)

## 2017-04-17 LAB — HEMOGLOBIN A1C: HEMOGLOBIN A1C: 5.3 % (ref 4.6–6.5)

## 2017-04-17 NOTE — Progress Notes (Signed)
Pre visit review using our clinic review tool, if applicable. No additional management support is needed unless otherwise documented below in the visit note. 

## 2017-04-17 NOTE — Progress Notes (Signed)
Radiation Oncology Follow up Note  Name: Claudia Little   Date:   04/17/2017 MRN:  [id number] DOB: 1945-09-29    This 72 y.o. female presents to the clinic today for 2.5 year follow-up status post whole breast radiation to her right breast for ductal carcinoma in situ.  REFERRING PROVIDER: Einar Pheasant, MD  HPI: Patient is a 72 year old female now out 2.5 years having completed whole breast radiation to her right breast for ER/PR negative ductal carcinoma in situ. Seen today in routine follow-up she is doing well. She specifically denies breast tenderness cough or bone pain. She is not on antiestrogen therapy based on negative ER/PR status.. Her last mammogram which I have reviewed was back in August was BI-RADS 2 benign.  COMPLICATIONS OF TREATMENT: none  FOLLOW UP COMPLIANCE: keeps appointments   PHYSICAL EXAM:  BP 130/68   Pulse (!) 55   Temp (!) 97.3 F (36.3 C)   Resp 20   Wt 153 lb 3.5 oz (69.5 kg)   BMI 24.73 kg/m  Lungs are clear to A&P cardiac examination essentially unremarkable with regular rate and rhythm. No dominant mass or nodularity is noted in either breast in 2 positions examined. Incision is well-healed. No axillary or supraclavicular adenopathy is appreciated. Cosmetic result is excellent. Well-developed well-nourished patient in NAD. HEENT reveals PERLA, EOMI, discs not visualized.  Oral cavity is clear. No oral mucosal lesions are identified. Neck is clear without evidence of cervical or supraclavicular adenopathy. Lungs are clear to A&P. Cardiac examination is essentially unremarkable with regular rate and rhythm without murmur rub or thrill. Abdomen is benign with no organomegaly or masses noted. Motor sensory and DTR levels are equal and symmetric in the upper and lower extremities. Cranial nerves II through XII are grossly intact. Proprioception is intact. No peripheral adenopathy or edema is identified. No motor or sensory levels are noted. Crude visual fields  are within normal range.  RADIOLOGY RESULTS: Most recent mammogram is reviewed and compatible with the above-stated findings  PLAN: Present time she continues to do well with no evidence of disease. I'm please were overall progress. I've asked to see her back in 1 year for follow-up. Patient knows to call with any concerns.  I would like to take this opportunity to thank you for allowing me to participate in the care of your patient.Noreene Filbert, MD

## 2017-04-17 NOTE — Progress Notes (Signed)
Patient ID: Claudia Little, female   DOB: 02-20-1946, 72 y.o.   MRN: [id number]   Subjective:    Patient ID: Claudia Little, female    DOB: Dec 29, 1945, 72 y.o.   MRN: [id number]  HPI  Patient here for a scheduled follow up.  She reports she is doing well.  Staying active.  No chest pain.  No sob.  No acid reflux.  No abdominal pain.  Bowels moving.  Trying to watch her diet.  Has f/u with Dr Donella Stade today.  Discussed lab results.     Past Medical History:  Diagnosis Date  . Breast cancer ([place]) 12/2013   rright breast cancer/adiation  . Chronic anxiety   . Insomnia   . Nephrolithiasis   . Osteoporosis   . PAC (premature atrial contraction)   . Palpitations   . SVT (supraventricular tachycardia) ([facility])    Past Surgical History:  Procedure Laterality Date  . BREAST CYST ASPIRATION Left 10+ yrs ago   neg  . BREAST EXCISIONAL BIOPSY Right 12/15/2013   positive  . BREAST SURGERY Right 12/15/2013   lumpectomy  . BUNIONECTOMY  early '70s  . DILATION AND CURETTAGE OF UTERUS  1973   after miscarriage  . LITHOTRIPSY     Family History  Problem Relation Age of Onset  . Aortic stenosis Mother   . Congestive Heart Failure Mother   . Aortic stenosis Father   . Coronary artery disease Father        Deceased from complications of atherosclerotic CAD and Atherosclerosis  . Congestive Heart Failure Father   . Parkinsonism Brother   . Colon polyps Sister   . Arthritis Maternal Grandfather   . Early death Paternal Grandmother   . Stroke Maternal Grandmother   . Kidney disease Paternal Grandfather   . Breast cancer Maternal Aunt 70  . Ovarian cancer Maternal Aunt    Social History   Socioeconomic History  . Marital status: Married    Spouse name: Richard  . Number of children: 1  . Years of education: HS  . Highest education level: None  Social Needs  . Financial resource strain: None  . Food insecurity - worry: None  . Food insecurity - inability: None  . Transportation needs -  medical: None  . Transportation needs - non-medical: None  Occupational History  . Occupation: Retired  Tobacco Use  . Smoking status: Never Smoker  . Smokeless tobacco: Former Network engineer and Sexual Activity  . Alcohol use: Yes    Alcohol/week: 4.2 oz    Types: 7 Glasses of wine per week  . Drug use: No  . Sexual activity: None  Other Topics Concern  . None  Social History Narrative  . None    Outpatient Encounter Medications as of 04/17/2017  Medication Sig  . atenolol (TENORMIN) 25 MG tablet TAKE ONE TABLET DAILY  . Cholecalciferol 1000 units capsule Take 1 capsule (1,000 Units total) by mouth daily.  . Coenzyme Q10 100 MG TABS Take 1 tablet (100 mg total) by mouth daily.  . rosuvastatin (CRESTOR) 5 MG tablet TAKE ONE TABLET BY MOUTH EVERY DAY  . FLUAD 0.5 ML SUSY ADM 0.5ML IM UTD  . PNEUMOVAX 23 25 MCG/0.5ML injection   . [DISCONTINUED] Biotin 10000 MCG TABS Take 1 tablet by mouth daily.   No facility-administered encounter medications on file as of 04/17/2017.     Review of Systems  Constitutional: Negative for appetite change and unexpected weight change.  HENT: Negative for  congestion and sinus pressure.   Respiratory: Negative for cough, chest tightness and shortness of breath.   Cardiovascular: Negative for chest pain, palpitations and leg swelling.  Gastrointestinal: Negative for abdominal pain, diarrhea, nausea and vomiting.  Genitourinary: Negative for difficulty urinating and dysuria.  Musculoskeletal: Negative for joint swelling and myalgias.  Skin: Negative for color change and rash.  Neurological: Negative for dizziness, light-headedness and headaches.  Psychiatric/Behavioral: Negative for agitation and dysphoric mood.       Objective:     Blood pressure rechecked by me:  120/72  Physical Exam  Constitutional: She appears well-developed and well-nourished. No distress.  HENT:  Nose: Nose normal.  Mouth/Throat: Oropharynx is clear and moist.    Neck: Neck supple. No thyromegaly present.  Cardiovascular: Normal rate and regular rhythm.  Pulmonary/Chest: Breath sounds normal. No respiratory distress. She has no wheezes.  Abdominal: Soft. Bowel sounds are normal. There is no tenderness.  Musculoskeletal: She exhibits no edema or tenderness.  Lymphadenopathy:    She has no cervical adenopathy.  Skin: No rash noted. No erythema.  Psychiatric: She has a normal mood and affect. Her behavior is normal.    BP 126/60   Pulse 62   Temp 98.1 F (36.7 C) (Oral)   Ht 5\' 6"  (1.676 m)   Wt 154 lb (69.9 kg)   SpO2 98%   BMI 24.86 kg/m  Wt Readings from Last 3 Encounters:  04/17/17 153 lb 3.5 oz (69.5 kg)  04/17/17 154 lb (69.9 kg)  12/13/16 155 lb 9.6 oz (70.6 kg)     Lab Results  Component Value Date   WBC 6.7 04/16/2017   HGB 13.5 04/16/2017   HCT 40.4 04/16/2017   PLT 250.0 04/16/2017   GLUCOSE 107 (H) 04/16/2017   CHOL 151 04/16/2017   TRIG 169.0 (H) 04/16/2017   HDL 51.60 04/16/2017   LDLDIRECT 92.0 12/11/2016   LDLCALC 66 04/16/2017   ALT 14 04/16/2017   AST 14 04/16/2017   NA 138 04/16/2017   K 4.0 04/16/2017   CL 101 04/16/2017   CREATININE 0.87 04/16/2017   BUN 20 04/16/2017   CO2 30 04/16/2017   TSH 1.76 04/16/2017   HGBA1C 5.3 04/17/2017    Mm Diag Breast Tomo Bilateral  Result Date: 11/23/2016 CLINICAL DATA:  History of right breast cancer, diagnosed in 2015. Annual examination. EXAM: 2D DIGITAL DIAGNOSTIC BILATERAL MAMMOGRAM WITH CAD AND ADJUNCT TOMO COMPARISON:  Previous exam(s). ACR Breast Density Category c: The breast tissue is heterogeneously dense, which may obscure small masses. FINDINGS: There are stable lumpectomy changes in the upper central right breast and a stable parenchymal pattern bilaterally. No mass, nonsurgical distortion, or suspicious microcalcification is identified in either breast to suggest malignancy. Mammographic images were processed with CAD. IMPRESSION: Stable lumpectomy  changes on the right. No evidence of malignancy in either breast. RECOMMENDATION: Diagnostic mammogram is suggested in 1 year. (Code:[id number]-B-01Y) I have discussed the findings and recommendations with the patient. Results were also provided in writing at the conclusion of the visit. If applicable, a reminder letter will be sent to the patient regarding the next appointment. BI-RADS CATEGORY  2: Benign. Electronically Signed   By: Curlene Dolphin M.D.   On: 11/23/2016 11:32       Assessment & Plan:   Problem List Items Addressed This Visit    Abnormal liver enzymes    Recent liver panel wnl.       History of breast cancer    Mammogram 11/23/16 - [facility]  II.  Has f/u with Dr Donella Stade today.       History of colon polyps    Last colonoscopy 02/2012.  Scheduled for f/u 05/2017.        Hypercholesteremia    On crestor.  LDL 66.  Triglycerides improved. Continue low cholesterol diet and exercise.  Continue crestor.  Follow lipid panel and liver function tests.        Relevant Orders   Hepatic function panel   Lipid panel   Basic metabolic panel   Supraventricular tachycardia [facility])    Previous saw Dr Caryl Comes.  S/p holter and event monitor.  On atenolol.  Doing well.  No problems.         Other Visit Diagnoses    Hyperglycemia    -  Primary   sugar slightly elevated.  check a1c.    Relevant Orders   Hemoglobin A1c       Einar Pheasant, MD

## 2017-04-17 NOTE — Progress Notes (Signed)
Order placed for add on  - a1c.

## 2017-04-20 ENCOUNTER — Encounter: Payer: Self-pay | Admitting: Internal Medicine

## 2017-04-20 NOTE — Assessment & Plan Note (Signed)
Last colonoscopy 02/2012.  Scheduled for f/u 05/2017.

## 2017-04-20 NOTE — Assessment & Plan Note (Signed)
Recent liver panel wnl.  

## 2017-04-20 NOTE — Assessment & Plan Note (Signed)
Mammogram 11/23/16 - Bir[facility] II.  Has f/u with Dr Donella Stade today.

## 2017-04-20 NOTE — Assessment & Plan Note (Signed)
Previous saw Dr Caryl Comes.  S/p holter and event monitor.  On atenolol.  Doing well.  No problems.

## 2017-04-20 NOTE — Assessment & Plan Note (Signed)
On crestor.  LDL 66.  Triglycerides improved. Continue low cholesterol diet and exercise.  Continue crestor.  Follow lipid panel and liver function tests.

## 2017-05-24 ENCOUNTER — Encounter: Payer: Self-pay | Admitting: *Deleted

## 2017-05-27 ENCOUNTER — Ambulatory Visit: Payer: Medicare Other | Admitting: Anesthesiology

## 2017-05-27 ENCOUNTER — Encounter: Payer: Self-pay | Admitting: Certified Registered Nurse Anesthetist

## 2017-05-27 ENCOUNTER — Ambulatory Visit
Admission: RE | Admit: 2017-05-27 | Discharge: 2017-05-27 | Disposition: A | Discharge status: Home / Self Care | Payer: Medicare Other | Source: Ambulatory Visit | Attending: Gastroenterology | Admitting: Gastroenterology

## 2017-05-27 ENCOUNTER — Encounter
Admission: RE | Disposition: A | Discharge status: Home / Self Care | Payer: Self-pay | Source: Ambulatory Visit | Attending: Gastroenterology

## 2017-05-27 DIAGNOSIS — Z86010 Personal history of colonic polyps: Secondary | ICD-10-CM | POA: Diagnosis present

## 2017-05-27 DIAGNOSIS — G4700 Insomnia, unspecified: Secondary | ICD-10-CM | POA: Insufficient documentation

## 2017-05-27 DIAGNOSIS — Z79899 Other long term (current) drug therapy: Secondary | ICD-10-CM | POA: Insufficient documentation

## 2017-05-27 DIAGNOSIS — Z1211 Encounter for screening for malignant neoplasm of colon: Secondary | ICD-10-CM | POA: Insufficient documentation

## 2017-05-27 DIAGNOSIS — I491 Atrial premature depolarization: Secondary | ICD-10-CM | POA: Diagnosis not present

## 2017-05-27 DIAGNOSIS — Z885 Allergy status to narcotic agent status: Secondary | ICD-10-CM | POA: Diagnosis not present

## 2017-05-27 DIAGNOSIS — Z87442 Personal history of urinary calculi: Secondary | ICD-10-CM | POA: Insufficient documentation

## 2017-05-27 DIAGNOSIS — I471 Supraventricular tachycardia: Secondary | ICD-10-CM | POA: Insufficient documentation

## 2017-05-27 DIAGNOSIS — Z8371 Family history of colonic polyps: Secondary | ICD-10-CM | POA: Insufficient documentation

## 2017-05-27 HISTORY — DX: Personal history of urinary calculi: Z87.442

## 2017-05-27 HISTORY — PX: COLONOSCOPY WITH PROPOFOL: SHX5780

## 2017-05-27 HISTORY — DX: Cardiac arrhythmia, unspecified: I49.9

## 2017-05-27 SURGERY — COLONOSCOPY WITH PROPOFOL
Anesthesia: General

## 2017-05-27 MED ORDER — GLYCOPYRROLATE 0.2 MG/ML IJ SOLN
INTRAMUSCULAR | Status: AC
  Filled 2017-05-27: qty 1

## 2017-05-27 MED ORDER — LIDOCAINE HCL (CARDIAC) 20 MG/ML IV SOLN
INTRAVENOUS | Status: DC | PRN
  Administered 2017-05-27: 30 mg via INTRAVENOUS

## 2017-05-27 MED ORDER — GLYCOPYRROLATE 0.2 MG/ML IJ SOLN
INTRAMUSCULAR | Status: DC | PRN
  Administered 2017-05-27 (×2): 0.1 mg via INTRAVENOUS

## 2017-05-27 MED ORDER — SODIUM CHLORIDE 0.9 % IV SOLN: INTRAVENOUS | Status: DC

## 2017-05-27 MED ORDER — SODIUM CHLORIDE 0.9 % IV SOLN
INTRAVENOUS | Status: DC
  Administered 2017-05-27: 1000 mL via INTRAVENOUS

## 2017-05-27 MED ORDER — SODIUM CHLORIDE 0.9 % IV SOLN
INTRAVENOUS | Status: DC
  Administered 2017-05-27: 13:00:00 via INTRAVENOUS

## 2017-05-27 MED ORDER — LIDOCAINE HCL (PF) 1 % IJ SOLN
2.0000 mL | Freq: Once | INTRAMUSCULAR | Status: AC
  Administered 2017-05-27: 0.3 mL via INTRADERMAL

## 2017-05-27 MED ORDER — PROPOFOL 500 MG/50ML IV EMUL
INTRAVENOUS | Status: DC | PRN
  Administered 2017-05-27: 100 ug/kg/min via INTRAVENOUS

## 2017-05-27 MED ORDER — PROPOFOL 10 MG/ML IV BOLUS
INTRAVENOUS | Status: DC | PRN
  Administered 2017-05-27: 70 mg via INTRAVENOUS
  Administered 2017-05-27: 30 mg via INTRAVENOUS

## 2017-05-27 MED ORDER — LIDOCAINE HCL (PF) 2 % IJ SOLN
INTRAMUSCULAR | Status: AC
  Filled 2017-05-27: qty 10

## 2017-05-27 MED ORDER — ATENOLOL 25 MG PO TABS
25.0000 mg | ORAL_TABLET | Freq: Once | ORAL | Status: AC
  Administered 2017-05-27: 25 mg via ORAL
  Filled 2017-05-27: qty 1

## 2017-05-27 MED ORDER — LIDOCAINE HCL (PF) 1 % IJ SOLN
INTRAMUSCULAR | Status: AC
  Administered 2017-05-27: 0.3 mL via INTRADERMAL
  Filled 2017-05-27: qty 2

## 2017-05-27 MED ORDER — PROPOFOL 500 MG/50ML IV EMUL
INTRAVENOUS | Status: AC
  Filled 2017-05-27: qty 50

## 2017-05-27 NOTE — Op Note (Signed)
[facility] Gastroenterology Patient Name: Kerryn Tennant Procedure Date: 05/27/2017 1:09 PM MRN: [id number] Account #: [id number] Date of Birth: [birth year] Admit Type: Outpatient Age: 72 Room: [facility] ENDO ROOM 1 Gender: Female Note Status: Finalized Procedure:            Colonoscopy Indications:          Family history of colonic polyps in a first-degree                        relative, Personal history of colonic polyps Providers:            Lollie Sails, MD Referring MD:         Einar Pheasant, MD (Referring MD) Medicines:            Monitored Anesthesia Care Complications:        No immediate complications. Procedure:            Pre-Anesthesia Assessment:                       - ASA Grade Assessment: II - A patient with mild                        systemic disease.                       After obtaining informed consent, the colonoscope was                        passed under direct vision. Throughout the procedure,                        the patient's blood pressure, pulse, and oxygen                        saturations were monitored continuously. The                        Colonoscope was introduced through the anus and                        advanced to the the cecum, identified by appendiceal                        orifice and ileocecal valve. The colonoscopy was                        performed with moderate difficulty due to restricted                        mobility of the colon. Successful completion of the                        procedure was aided by changing the patient to a supine                        position. The patient tolerated the procedure well. The                        quality of the bowel preparation was good. Findings:      The colon (entire examined portion)  appeared normal.      The retroflexed view of the distal rectum and anal verge was normal and       showed no anal or rectal abnormalities.      The digital rectal exam was  normal. Impression:           - The entire examined colon is normal.                       - The distal rectum and anal verge are normal on                        retroflexion view.                       - No specimens collected. Recommendation:       - Discharge patient to home.                       - Repeat colonoscopy in 5 years for screening purposes. Procedure Code(s):    --- Professional ---                       [phone], Colonoscopy, flexible; diagnostic, including                        collection of specimen(s) by brushing or washing, when                        performed (separate procedure) CPT copyright 2016 American Medical Association. All rights reserved. The codes documented in this report are preliminary and upon coder review may  be revised to meet current compliance requirements. Lollie Sails, MD 05/27/2017 1:42:56 PM This report has been signed electronically. Number of Addenda: 0 Note Initiated On: 05/27/2017 1:09 PM Scope Withdrawal Time: 0 hours 3 minutes 54 seconds  Total Procedure Duration: 0 hours 22 minutes 22 seconds       [facility]

## 2017-05-27 NOTE — H&P (Signed)
Outpatient short stay form Pre-procedure 05/27/2017 12:48 PM Lollie Sails MD  Primary Physician: Dr. Einar Pheasant  Reason for visit: Colonoscopy  History of present illness: Patient is a 72 year old female presenting today as above.  She has personal history of adenomatous colon polyps and family history of colon polyps in a primary relative.  She tolerated prep well.  She takes no aspirin or blood thinning agent.    Current Facility-Administered Medications:  .  0.9 %  sodium chloride infusion, , Intravenous, Continuous, Lollie Sails, MD, Last Rate: 20 mL/hr at 05/27/17 1243, 1,000 mL at 05/27/17 1243 .  0.9 %  sodium chloride infusion, , Intravenous, Continuous, Lollie Sails, MD .  0.9 %  sodium chloride infusion, , Intravenous, Continuous, Lollie Sails, MD  Medications Prior to Admission  Medication Sig Dispense Refill Last Dose  . atenolol (TENORMIN) 25 MG tablet TAKE ONE TABLET DAILY 90 tablet 1 Taking  . Cholecalciferol 1000 units capsule Take 1 capsule (1,000 Units total) by mouth daily.   Taking  . Coenzyme Q10 100 MG TABS Take 1 tablet (100 mg total) by mouth daily. 30 tablet 0 Taking  . FLUAD 0.5 ML SUSY ADM 0.5ML IM UTD  0 Not Taking  . PNEUMOVAX 23 25 MCG/0.5ML injection    Not Taking  . rosuvastatin (CRESTOR) 5 MG tablet TAKE ONE TABLET BY MOUTH EVERY DAY 30 tablet 1 Taking     Allergies  Allergen Reactions  . Oxycodone Nausea And Vomiting     Past Medical History:  Diagnosis Date  . Breast cancer ([place]) 12/2013   rright breast cancer/adiation  . Chronic anxiety   . Dysrhythmia    SVT  . History of kidney stones   . Insomnia   . Nephrolithiasis   . Osteoporosis   . PAC (premature atrial contraction)   . Palpitations   . SVT (supraventricular tachycardia) ([facility])     Review of systems:      Physical Exam    Heart and lungs: You rate and rhythm without rub gallop, lungs are bilaterally clear.    HEENT: Normocephalic atraumatic  eyes are anicteric    Other:    Pertinant exam for procedure: Soft nontender nondistended bowel sounds positive normoactive.    Planned proceedures: Colonoscopy and indicated procedures. I have discussed the risks benefits and complications of procedures to include not limited to bleeding, infection, perforation and the risk of sedation and the patient wishes to proceed.    Lollie Sails, MD Gastroenterology 05/27/2017  12:48 PM

## 2017-05-27 NOTE — Anesthesia Post-op Follow-up Note (Signed)
Anesthesia QCDR form completed.        

## 2017-05-27 NOTE — Anesthesia Preprocedure Evaluation (Signed)
Anesthesia Evaluation  Patient identified by MRN, date of birth, ID band Patient awake    Reviewed: Allergy & Precautions, NPO status , Patient's Chart, lab work & pertinent test results  History of Anesthesia Complications Negative for: history of anesthetic complications  Airway Mallampati: II       Dental   Pulmonary neg sleep apnea, neg COPD,           Cardiovascular (-) hypertension(-) Past MI and (-) CHF + dysrhythmias Supra Ventricular Tachycardia (-) Valvular Problems/Murmurs     Neuro/Psych neg Seizures Anxiety    GI/Hepatic Neg liver ROS, neg GERD  ,  Endo/Other  neg diabetes  Renal/GU Renal disease (stones)     Musculoskeletal   Abdominal   Peds  Hematology   Anesthesia Other Findings   Reproductive/Obstetrics                             Anesthesia Physical Anesthesia Plan  ASA: II  Anesthesia Plan: General   Post-op Pain Management:    Induction: Intravenous  PONV Risk Score and Plan: 3  Airway Management Planned:   Additional Equipment:   Intra-op Plan:   Post-operative Plan:   Informed Consent: I have reviewed the patients History and Physical, chart, labs and discussed the procedure including the risks, benefits and alternatives for the proposed anesthesia with the patient or authorized representative who has indicated his/her understanding and acceptance.     Plan Discussed with:   Anesthesia Plan Comments:         Anesthesia Quick Evaluation

## 2017-05-27 NOTE — Anesthesia Postprocedure Evaluation (Signed)
Anesthesia Post Note  Patient: Claudia Little  Procedure(s) Performed: COLONOSCOPY WITH PROPOFOL (N/A )  Patient location during evaluation: Endoscopy Anesthesia Type: General Level of consciousness: awake and alert Pain management: pain level controlled Vital Signs Assessment: post-procedure vital signs reviewed and stable Respiratory status: spontaneous breathing and respiratory function stable Cardiovascular status: stable Anesthetic complications: no     Last Vitals:  Vitals:   05/27/17 1408 05/27/17 1418  BP:    Pulse:  76  Resp: 20   Temp: 36.6 C   SpO2: 95%     Last Pain:  Vitals:   05/27/17 1408  TempSrc: Tympanic                 KEPHART,WILLIAM K

## 2017-05-27 NOTE — Transfer of Care (Signed)
Immediate Anesthesia Transfer of Care Note  Patient: Claudia Little  Procedure(s) Performed: COLONOSCOPY WITH PROPOFOL (N/A )  Patient Location: PACU and Endoscopy Unit  Anesthesia Type:General  Level of Consciousness: sedated  Airway & Oxygen Therapy: Patient Spontanous Breathing  Post-op Assessment: Report given to RN  Post vital signs: stable  Last Vitals:  Vitals:   05/27/17 1231 05/27/17 1348  BP: (!) 152/61 (P) 138/77  Pulse:    Resp:    Temp:  (!) (P) 35.6 C  SpO2:      Last Pain:  Vitals:   05/27/17 1348  TempSrc: (P) Tympanic         Complications: No apparent anesthesia complications

## 2017-05-28 ENCOUNTER — Encounter: Payer: Self-pay | Admitting: Gastroenterology

## 2017-06-12 SURGERY — Surgical Case
Anesthesia: *Unknown

## 2017-08-06 ENCOUNTER — Ambulatory Visit: Payer: Medicare Other

## 2017-08-19 ENCOUNTER — Other Ambulatory Visit: Payer: Self-pay | Admitting: Internal Medicine

## 2017-08-19 DIAGNOSIS — I471 Supraventricular tachycardia: Secondary | ICD-10-CM

## 2017-08-20 ENCOUNTER — Ambulatory Visit (INDEPENDENT_AMBULATORY_CARE_PROVIDER_SITE_OTHER): Payer: Medicare Other

## 2017-08-20 VITALS — BP 122/62 | HR 64 | Temp 97.8°F | Resp 12 | Ht 66.0 in | Wt 149.8 lb

## 2017-08-20 DIAGNOSIS — Z0000 Encounter for general adult medical examination without abnormal findings: Secondary | ICD-10-CM

## 2017-08-20 NOTE — Progress Notes (Addendum)
Subjective:   Claudia Little is a 72 y.o. female who presents for Medicare Annual (Subsequent) preventive examination.  Review of Systems:  No ROS.  Medicare Wellness Visit. Additional risk factors are reflected in the social history.  Cardiac Risk Factors include: advanced age (>40men, >104 women)     Objective:     Vitals: BP 122/62 (BP Location: Left Arm, Patient Position: Sitting, Cuff Size: Normal)   Pulse 64   Temp 97.8 F (36.6 C) (Oral)   Resp 12   Ht 5\' 6"  (1.676 m)   SpO2 98%   BMI 23.89 kg/m   Body mass index is 23.89 kg/m.  Advanced Directives 08/20/2017 05/27/2017 04/17/2017 03/13/2016 11/23/2015 09/05/2015 03/24/2015  Does Patient Have a Medical Advance Directive? Yes Yes No No Yes Yes Yes  Type of Paramedic of [place];Living will [place];Living will - - [place];Living will Living will;Healthcare Power of Attorney Living will;Healthcare Power of Attorney  Does patient want to make changes to medical advance directive? No - Patient declined - - - No - Patient declined - -  Copy of [place] in Chart? No - copy requested Yes - - No - copy requested - -  Would patient like information on creating a medical advance directive? - - No - Patient declined No - Patient declined - - -    Tobacco Social History   Tobacco Use  Smoking Status Never Smoker  Smokeless Tobacco Former Engineer, structural given: Not Answered   Clinical Intake:  Pre-visit preparation completed: Yes  Pain : No/denies pain     Nutritional Status: BMI of 19-24  Normal Diabetes: No  How often do you need to have someone help you when you read instructions, pamphlets, or other written materials from your doctor or pharmacy?: 1 - Never  Interpreter Needed?: No     Past Medical History:  Diagnosis Date  . Breast cancer ([place]) 12/2013   rright breast cancer/adiation  . Chronic anxiety   . Dysrhythmia      SVT  . History of kidney stones   . Insomnia   . Nephrolithiasis   . Osteoporosis   . PAC (premature atrial contraction)   . Palpitations   . SVT (supraventricular tachycardia) (HCC)    Past Surgical History:  Procedure Laterality Date  . BREAST CYST ASPIRATION Left 10+ yrs ago   neg  . BREAST EXCISIONAL BIOPSY Right 12/15/2013   positive  . BREAST SURGERY Right 12/15/2013   lumpectomy  . BUNIONECTOMY  early '70s  . COLONOSCOPY WITH PROPOFOL N/A 05/27/2017   Procedure: COLONOSCOPY WITH PROPOFOL;  Surgeon: Lollie Sails, MD;  Location[facility] ENDOSCOPY;  Service: Endoscopy;  Laterality: N/A;  . DILATION AND CURETTAGE OF UTERUS  1973   after miscarriage  . LITHOTRIPSY     Family History  Problem Relation Age of Onset  . Aortic stenosis Mother   . Congestive Heart Failure Mother   . Aortic stenosis Father   . Coronary artery disease Father        Deceased from complications of atherosclerotic CAD and Atherosclerosis  . Congestive Heart Failure Father   . Parkinsonism Brother   . Colon polyps Sister   . Arthritis Maternal Grandfather   . Early death Paternal Grandmother   . Heart block Brother   . Stroke Maternal Grandmother   . Kidney disease Paternal Grandfather   . Breast cancer Maternal Aunt 70  .  Ovarian cancer Maternal Aunt    Social History   Socioeconomic History  . Marital status: Married    Spouse name: Richard  . Number of children: 1  . Years of education: HS  . Highest education level: Not on file  Occupational History  . Occupation: Retired  Scientific laboratory technician  . Financial resource strain: Not hard at all  . Food insecurity:    Worry: Never true    Inability: Never true  . Transportation needs:    Medical: No    Non-medical: No  Tobacco Use  . Smoking status: Never Smoker  . Smokeless tobacco: Former Network engineer and Sexual Activity  . Alcohol use: Yes    Alcohol/week: 4.2 oz    Types: 7 Glasses of wine per week  . Drug use: No  . Sexual  activity: Not on file  Lifestyle  . Physical activity:    Days per week: 3 days    Minutes per session: 60 min  . Stress: Not at all  Relationships  . Social connections:    Talks on phone: Not on file    Gets together: Not on file    Attends religious service: Not on file    Active member of club or organization: Not on file    Attends meetings of clubs or organizations: Not on file    Relationship status: Not on file  Other Topics Concern  . Not on file  Social History Narrative  . Not on file    Outpatient Encounter Medications as of 08/20/2017  Medication Sig  . atenolol (TENORMIN) 25 MG tablet TAKE ONE TABLET EVERY DAY  . Cholecalciferol 1000 units capsule Take 1 capsule (1,000 Units total) by mouth daily.  . Coenzyme Q10 100 MG TABS Take 1 tablet (100 mg total) by mouth daily.  . rosuvastatin (CRESTOR) 5 MG tablet TAKE ONE TABLET BY MOUTH EVERY DAY (Patient taking differently: take 3 times weekly (every other day))  . [DISCONTINUED] FLUAD 0.5 ML SUSY ADM 0.5ML IM UTD  . [DISCONTINUED] PNEUMOVAX 23 25 MCG/0.5ML injection    No facility-administered encounter medications on file as of 08/20/2017.     Activities of Daily Living In your present state of health, do you have any difficulty performing the following activities: 08/20/2017  Hearing? N  Vision? N  Difficulty concentrating or making decisions? N  Walking or climbing stairs? N  Dressing or bathing? N  Doing errands, shopping? N  Preparing Food and eating ? N  Using the Toilet? N  In the past six months, have you accidently leaked urine? N  Do you have problems with loss of bowel control? N  Managing your Medications? N  Managing your Finances? N  Housekeeping or managing your Housekeeping? N  Some recent data might be hidden    Patient Care Team: Einar Pheasant, MD as PCP - General (Internal Medicine)    Assessment:   This is a routine wellness examination for Claudia Little.  The goal of the wellness visit is  to assist the patient how to close the gaps in care and create a preventative care plan for the patient.   The roster of all physicians providing medical care to patient is listed in the Snapshot section of the chart.  Taking calcium VIT D as appropriate/Osteoporosis risk reviewed.    Safety issues reviewed; Smoke and carbon monoxide detectors in the home. No firearms in the home. Wears seatbelts when driving or riding with others. No violence in the home.  They do not have excessive sun exposure.  Discussed the need for sun protection: hats, long sleeves and the use of sunscreen if there is significant sun exposure.  Patient is alert, normal appearance, oriented to person/place/and time.  Correctly identified the president of the [place] and recalls of 3/3 words. Performs simple calculations and can read correct time from watch face. Displays appropriate judgement.  No new identified risk were noted.  No failures at ADL's or IADL's.    BMI- discussed the importance of a healthy diet, water intake and the benefits of aerobic exercise. Educational material provided.   24 hour diet recall: Regular diet  Dental- every 6 months.  Eye- Visual acuity not assessed per patient preference since they have regular follow up with the ophthalmologist.  Wears corrective lenses.  Sleep patterns- Sleeps through the night without issues.    Health maintenance gaps- closed.  Patient Concerns: None at this time. Follow up with PCP as needed.  Exercise Activities and Dietary recommendations Current Exercise Habits: Home exercise routine, Type of exercise: yoga;stretching, Time (Minutes): 60, Frequency (Times/Week): 3, Weekly Exercise (Minutes/Week): 180, Intensity: Moderate  Goals    . Exercise 150 minutes per week (moderate activity)       Fall Risk Fall Risk  08/20/2017 04/17/2017 05/22/2016 03/24/2015 09/13/2014  Falls in the past year? No No No No No   Depression Screen PHQ 2/9 Scores  08/20/2017 04/17/2017 05/22/2016 03/24/2015  PHQ - 2 Score 0 0 0 1     Cognitive Function MMSE - Mini Mental State Exam 08/20/2017  Orientation to time 5  Orientation to Place 5  Registration 3  Attention/ Calculation 5  Recall 3  Language- name 2 objects 2  Language- repeat 1  Language- follow 3 step command 3  Language- read & follow direction 1  Write a sentence 1  Copy design 1  Total score 30        Immunization History  Administered Date(s) Administered  . Influenza Split 02/14/2012  . Influenza-Unspecified 01/24/2017  . Pneumococcal Conjugate-13 03/22/2014, 04/10/2017  . Pneumococcal Polysaccharide-23 01/16/2011, 04/10/2017  . Tdap 03/22/2014  . Zoster 07/13/2010   Screening Tests Health Maintenance  Topic Date Due  . INFLUENZA VACCINE  10/31/2017  . MAMMOGRAM  11/24/2018  . TETANUS/TDAP  03/22/2024  . COLONOSCOPY  05/28/2027  . DEXA SCAN  Completed  . Hepatitis C Screening  Completed  . PNA vac Low Risk Adult  Completed      Plan:    End of life planning; Advance aging; Advanced directives discussed. Copy of current HCPOA/Living Will requested.    I have personally reviewed and noted the following in the patient's chart:   . Medical and social history . Use of alcohol, tobacco or illicit drugs  . Current medications and supplements . Functional ability and status . Nutritional status . Physical activity . Advanced directives . List of other physicians . Hospitalizations, surgeries, and ER visits in previous 12 months . Vitals . Screenings to include cognitive, depression, and falls . Referrals and appointments  In addition, I have reviewed and discussed with patient certain preventive protocols, quality metrics, and best practice recommendations. A written personalized care plan for preventive services as well as general preventive health recommendations were provided to patient.     Varney Biles, LPN  0/[id number]/7939   Reviewed above  information.  Agree with assessment and plan.    Dr Nicki Reaper

## 2017-08-20 NOTE — Patient Instructions (Addendum)
  Ms. Drewry , Thank you for taking time to come for your Medicare Wellness Visit. I appreciate your ongoing commitment to your health goals. Please review the following plan we discussed and let me know if I can assist you in the future.  Follow up as needed.    Bring a copy of your [place] and/or Living Will to be scanned into chart.  Have a great day!   These are the goals we discussed: Goals    . Exercise 150 minutes per week (moderate activity)       This is a list of the screening recommended for you and due dates:  Health Maintenance  Topic Date Due  . Flu Shot  10/31/2017  . Mammogram  11/24/2018  . Tetanus Vaccine  03/22/2024  . Colon Cancer Screening  05/28/2027  . DEXA scan (bone density measurement)  Completed  .  Hepatitis C: One time screening is recommended by Center for Disease Control  (CDC) for  adults born from [birth year] through 1966.   Completed  . Pneumonia vaccines  Completed

## 2017-10-16 ENCOUNTER — Encounter: Payer: Self-pay | Admitting: Internal Medicine

## 2017-10-16 ENCOUNTER — Other Ambulatory Visit (INDEPENDENT_AMBULATORY_CARE_PROVIDER_SITE_OTHER): Payer: Medicare Other

## 2017-10-16 DIAGNOSIS — R739 Hyperglycemia, unspecified: Secondary | ICD-10-CM | POA: Diagnosis not present

## 2017-10-16 DIAGNOSIS — E7800 Pure hypercholesterolemia, unspecified: Secondary | ICD-10-CM | POA: Diagnosis not present

## 2017-10-16 LAB — HEMOGLOBIN A1C: HEMOGLOBIN A1C: 5.3 % (ref 4.6–6.5)

## 2017-10-16 LAB — BASIC METABOLIC PANEL
BUN: 14 mg/dL (ref 6–23)
CHLORIDE: 105 meq/L (ref 96–112)
CO2: 29 mEq/L (ref 19–32)
Calcium: 9 mg/dL (ref 8.4–10.5)
Creatinine, Ser: 0.95 mg/dL (ref 0.40–1.20)
GFR: 61.27 mL/min (ref >60.00)
Glucose, Bld: 116 mg/dL — ABNORMAL HIGH (ref 70–99)
POTASSIUM: 4.3 meq/L (ref 3.5–5.1)
Sodium: 141 mEq/L (ref 135–145)

## 2017-10-16 LAB — LIPID PANEL
CHOLESTEROL: 153 mg/dL (ref 0–200)
HDL: 56.9 mg/dL (ref >39.00)
LDL CALC: 67 mg/dL (ref 0–99)
NonHDL: 95.81
TRIGLYCERIDES: 143 mg/dL (ref 0.0–149.0)
Total CHOL/HDL Ratio: 3
VLDL: 28.6 mg/dL (ref 0.0–40.0)

## 2017-10-16 LAB — HEPATIC FUNCTION PANEL
ALT: 13 U/L (ref 0–35)
AST: 17 U/L (ref 0–37)
Albumin: 4.3 g/dL (ref 3.5–5.2)
Alkaline Phosphatase: 55 U/L (ref 39–117)
BILIRUBIN DIRECT: 0.1 mg/dL (ref 0.0–0.3)
BILIRUBIN TOTAL: 0.6 mg/dL (ref 0.2–1.2)
TOTAL PROTEIN: 7.2 g/dL (ref 6.0–8.3)

## 2017-10-17 ENCOUNTER — Encounter: Payer: Medicare Other | Admitting: Internal Medicine

## 2017-10-21 ENCOUNTER — Other Ambulatory Visit: Payer: Self-pay | Admitting: Internal Medicine

## 2017-10-21 ENCOUNTER — Encounter: Payer: Self-pay | Admitting: Internal Medicine

## 2017-10-21 ENCOUNTER — Ambulatory Visit (INDEPENDENT_AMBULATORY_CARE_PROVIDER_SITE_OTHER): Payer: Medicare Other | Admitting: Internal Medicine

## 2017-10-21 VITALS — BP 126/70 | HR 54 | Temp 98.3°F | Resp 18 | Ht 66.0 in | Wt 149.2 lb

## 2017-10-21 DIAGNOSIS — Z853 Personal history of malignant neoplasm of breast: Secondary | ICD-10-CM

## 2017-10-21 DIAGNOSIS — D0511 Intraductal carcinoma in situ of right breast: Secondary | ICD-10-CM

## 2017-10-21 DIAGNOSIS — E7800 Pure hypercholesterolemia, unspecified: Secondary | ICD-10-CM

## 2017-10-21 DIAGNOSIS — F419 Anxiety disorder, unspecified: Secondary | ICD-10-CM | POA: Diagnosis not present

## 2017-10-21 DIAGNOSIS — Z0000 Encounter for general adult medical examination without abnormal findings: Secondary | ICD-10-CM

## 2017-10-21 DIAGNOSIS — G4700 Insomnia, unspecified: Secondary | ICD-10-CM | POA: Diagnosis not present

## 2017-10-21 DIAGNOSIS — Z1231 Encounter for screening mammogram for malignant neoplasm of breast: Secondary | ICD-10-CM | POA: Diagnosis not present

## 2017-10-21 DIAGNOSIS — Z1239 Encounter for other screening for malignant neoplasm of breast: Secondary | ICD-10-CM

## 2017-10-21 MED ORDER — TRAZODONE HCL 50 MG PO TABS: 25.0000 mg | ORAL_TABLET | Freq: Every evening | ORAL | 1 refills | Status: DC | PRN

## 2017-10-21 MED ORDER — ALPRAZOLAM 0.25 MG PO TABS: 0.2500 mg | ORAL_TABLET | Freq: Two times a day (BID) | ORAL | 0 refills | Status: DC | PRN

## 2017-10-21 NOTE — Assessment & Plan Note (Signed)
Previous on [place].  Discussed with her today.  Start trazodone as directed.  Follow.

## 2017-10-21 NOTE — Assessment & Plan Note (Signed)
On crestor.  Low cholesterol diet and exercise.  Follow lipid panel and liver function tests.   

## 2017-10-21 NOTE — Assessment & Plan Note (Signed)
Right breast DCIS s/p lumpectomy.  S/p XRT.  Is ER/PR positive.  Mammogram 11/23/16.  She will call and schedule f/u mammogram.

## 2017-10-21 NOTE — Assessment & Plan Note (Signed)
Increased stress as outlined.  Discussed with her today.  She is not sleeping well.  Discussed treatment options.  Start trazodone as directed.  She also request xanax to have if needed.  rx given.  Follow closely.  Get her back in soon to reassess.

## 2017-10-21 NOTE — Assessment & Plan Note (Signed)
Physical today 10/21/17.  Mammogram 11/23/16 - Birads II.  Colonoscopy 05/27/17 - normal.  Recommended f/u in 5 years.

## 2017-10-21 NOTE — Progress Notes (Signed)
Patient ID: Claudia Little, female   DOB: December 22, 1945, 72 y.o.   MRN: [id number]   Subjective:    Patient ID: Claudia Little, female    DOB: Dec 30, 1945, 72 y.o.   MRN: [id number]  HPI  Patient here for a complete physical exam. She reports she has been doing relatively well.  Has had trouble sleeping.  Has been a persistent issue for her, but has worsened recently.  Has tried [place] previously.  States years ago tried trazodone.  Also has had alprazolam previously to have as needed.  Increased stress. States her son recently lost his job.  Increased stress related to this.  Trying to stay active.  No chest pain.  No sbo.  No acid reflux.  No abdominal pain.  Bowels moving.     Past Medical History:  Diagnosis Date  . Breast cancer ([place]) 12/2013   rright breast cancer/adiation  . Chronic anxiety   . Dysrhythmia    SVT  . History of kidney stones   . Insomnia   . Nephrolithiasis   . Osteoporosis   . PAC (premature atrial contraction)   . Palpitations   . SVT (supraventricular tachycardia) (HCC)    Past Surgical History:  Procedure Laterality Date  . BREAST CYST ASPIRATION Left 10+ yrs ago   neg  . BREAST EXCISIONAL BIOPSY Right 12/15/2013   positive  . BREAST SURGERY Right 12/15/2013   lumpectomy  . BUNIONECTOMY  early '70s  . COLONOSCOPY WITH PROPOFOL N/A 05/27/2017   Procedure: COLONOSCOPY WITH PROPOFOL;  Surgeon: Lollie Sails, MD;  Location: [facility] ENDOSCOPY;  Service: Endoscopy;  Laterality: N/A;  . DILATION AND CURETTAGE OF UTERUS  1973   after miscarriage  . LITHOTRIPSY     Family History  Problem Relation Age of Onset  . Aortic stenosis Mother   . Congestive Heart Failure Mother   . Aortic stenosis Father   . Coronary artery disease Father        Deceased from complications of atherosclerotic CAD and Atherosclerosis  . Congestive Heart Failure Father   . Parkinsonism Brother   . Colon polyps Sister   . Arthritis Maternal Grandfather   . Early death Paternal  Grandmother   . Heart block Brother   . Stroke Maternal Grandmother   . Kidney disease Paternal Grandfather   . Breast cancer Maternal Aunt 70  . Ovarian cancer Maternal Aunt    Social History   Socioeconomic History  . Marital status: Married    Spouse name: Richard  . Number of children: 1  . Years of education: HS  . Highest education level: Not on file  Occupational History  . Occupation: Retired  Scientific laboratory technician  . Financial resource strain: Not hard at all  . Food insecurity:    Worry: Never true    Inability: Never true  . Transportation needs:    Medical: No    Non-medical: No  Tobacco Use  . Smoking status: Never Smoker  . Smokeless tobacco: Former Network engineer and Sexual Activity  . Alcohol use: Yes    Alcohol/week: 4.2 oz    Types: 7 Glasses of wine per week  . Drug use: No  . Sexual activity: Not on file  Lifestyle  . Physical activity:    Days per week: 3 days    Minutes per session: 60 min  . Stress: Not at all  Relationships  . Social connections:    Talks on phone: Not on file    Gets  together: Not on file    Attends religious service: Not on file    Active member of club or organization: Not on file    Attends meetings of clubs or organizations: Not on file    Relationship status: Not on file  Other Topics Concern  . Not on file  Social History Narrative  . Not on file    Outpatient Encounter Medications as of 10/21/2017  Medication Sig  . atenolol (TENORMIN) 25 MG tablet TAKE ONE TABLET EVERY DAY  . Cholecalciferol 1000 units capsule Take 1 capsule (1,000 Units total) by mouth daily.  . Coenzyme Q10 100 MG TABS Take 1 tablet (100 mg total) by mouth daily.  . rosuvastatin (CRESTOR) 5 MG tablet TAKE ONE TABLET BY MOUTH EVERY DAY (Patient taking differently: take 3 times weekly (every other day))  . ALPRAZolam (XANAX) 0.25 MG tablet Take 1 tablet (0.25 mg total) by mouth 2 (two) times daily as needed for anxiety.  . traZODone (DESYREL) 50 MG  tablet Take 0.5-1 tablets (25-50 mg total) by mouth at bedtime as needed for sleep.   No facility-administered encounter medications on file as of 10/21/2017.     Review of Systems  Constitutional: Negative for appetite change and unexpected weight change.  HENT: Negative for congestion and sinus pressure.   Eyes: Negative for pain and visual disturbance.  Respiratory: Negative for cough, chest tightness and shortness of breath.   Cardiovascular: Negative for chest pain, palpitations and leg swelling.  Gastrointestinal: Negative for abdominal pain, diarrhea, nausea and vomiting.  Genitourinary: Negative for difficulty urinating and dysuria.  Musculoskeletal: Negative for joint swelling and myalgias.  Skin: Negative for color change and rash.  Neurological: Negative for dizziness, light-headedness and headaches.  Hematological: Negative for adenopathy. Does not bruise/bleed easily.  Psychiatric/Behavioral: Positive for sleep disturbance. Negative for dysphoric mood.       Increased stress.         Objective:    Physical Exam  Constitutional: She is oriented to person, place, and time. She appears well-developed and well-nourished. No distress.  HENT:  Nose: Nose normal.  Mouth/Throat: Oropharynx is clear and moist.  Eyes: Right eye exhibits no discharge. Left eye exhibits no discharge. No scleral icterus.  Neck: Neck supple. No thyromegaly present.  Cardiovascular: Normal rate and regular rhythm.  Pulmonary/Chest: Breath sounds normal. No accessory muscle usage. No tachypnea. No respiratory distress. She has no decreased breath sounds. She has no wheezes. She has no rhonchi. Right breast exhibits no inverted nipple, no mass, no nipple discharge and no tenderness (no axillary adenopathy). Left breast exhibits no inverted nipple, no mass, no nipple discharge and no tenderness (no axilarry adenopathy).  Abdominal: Soft. Bowel sounds are normal. There is no tenderness.  Musculoskeletal:  She exhibits no edema or tenderness.  Lymphadenopathy:    She has no cervical adenopathy.  Neurological: She is alert and oriented to person, place, and time.  Skin: No rash noted. No erythema.  Psychiatric: She has a normal mood and affect. Her behavior is normal.    BP 126/70 (BP Location: Left Arm, Patient Position: Sitting, Cuff Size: Normal)   Pulse (!) 54   Temp 98.3 F (36.8 C) (Oral)   Resp 18   Ht 5\' 6"  (1.676 m)   Wt 149 lb 3.2 oz (67.7 kg)   SpO2 98%   BMI 24.08 kg/m  Wt Readings from Last 3 Encounters:  10/21/17 149 lb 3.2 oz (67.7 kg)  08/20/17 149 lb 12.8 oz (67.9 kg)  05/27/17 148 lb (67.1 kg)     Lab Results  Component Value Date   WBC 6.7 04/16/2017   HGB 13.5 04/16/2017   HCT 40.4 04/16/2017   PLT 250.0 04/16/2017   GLUCOSE 116 (H) 10/16/2017   CHOL 153 10/16/2017   TRIG 143.0 10/16/2017   HDL 56.90 10/16/2017   LDLDIRECT 92.0 12/11/2016   LDLCALC 67 10/16/2017   ALT 13 10/16/2017   AST 17 10/16/2017   NA 141 10/16/2017   K 4.3 10/16/2017   CL 105 10/16/2017   CREATININE 0.95 10/16/2017   BUN 14 10/16/2017   CO2 29 10/16/2017   TSH 1.76 04/16/2017   HGBA1C 5.3 10/16/2017       Assessment & Plan:   Problem List Items Addressed This Visit    Anxiety    Increased stress as outlined.  Discussed with her today.  She is not sleeping well.  Discussed treatment options.  Start trazodone as directed.  She also request xanax to have if needed.  rx given.  Follow closely.  Get her back in soon to reassess.        Relevant Medications   traZODone (DESYREL) 50 MG tablet   ALPRAZolam (XANAX) 0.25 MG tablet   Ductal carcinoma in situ (DCIS) of right breast    Right breast DCIS s/p lumpectomy.  S/p XRT.  Is ER/PR positive.  Mammogram 11/23/16.  She will call and schedule f/u mammogram.       Relevant Medications   ALPRAZolam (XANAX) 0.25 MG tablet   Healthcare maintenance    Physical today 10/21/17.  Mammogram 11/23/16 - Birads II.  Colonoscopy  05/27/17 - normal.  Recommended f/u in 5 years.        Hypercholesteremia    On crestor.  Low cholesterol diet and exercise.  Follow lipid panel and liver function tests.        Insomnia    Previous on ambien.  Discussed with her today.  Start trazodone as directed.  Follow.         Other Visit Diagnoses    Breast cancer screening    -  Primary   Routine general medical examination at a health care facility           Einar Pheasant, MD

## 2017-10-22 ENCOUNTER — Telehealth: Payer: Self-pay

## 2017-10-22 DIAGNOSIS — Z853 Personal history of malignant neoplasm of breast: Secondary | ICD-10-CM

## 2017-10-22 NOTE — Telephone Encounter (Signed)
Orders have been signed by provider.

## 2017-10-22 NOTE — Telephone Encounter (Signed)
Copied from [place] [id number]. Topic: Referral - Question >> Oct 21, 2017  2:50 PM Vernona Rieger wrote: Reason for CRM: Patient states [facility] received the order for her to get a diagnostic mammo. They asked her to call and let Dr Nicki Reaper know that it needs to be signed before they can accept it.

## 2017-11-21 ENCOUNTER — Other Ambulatory Visit: Payer: Self-pay | Admitting: Internal Medicine

## 2017-11-21 DIAGNOSIS — E7800 Pure hypercholesterolemia, unspecified: Secondary | ICD-10-CM

## 2017-11-21 NOTE — Telephone Encounter (Signed)
error 

## 2017-11-25 ENCOUNTER — Ambulatory Visit
Admission: RE | Admit: 2017-11-25 | Discharge: 2017-11-25 | Disposition: A | Discharge status: Home / Self Care | Payer: Medicare Other | Source: Ambulatory Visit | Attending: Internal Medicine | Admitting: Internal Medicine

## 2017-11-25 DIAGNOSIS — Z853 Personal history of malignant neoplasm of breast: Secondary | ICD-10-CM

## 2017-11-25 HISTORY — DX: Personal history of irradiation: Z92.3

## 2017-12-03 ENCOUNTER — Encounter: Payer: Self-pay | Admitting: Internal Medicine

## 2017-12-03 ENCOUNTER — Ambulatory Visit: Payer: Medicare Other | Admitting: Internal Medicine

## 2017-12-03 VITALS — BP 110/68 | HR 72 | Temp 98.3°F | Resp 18 | Wt 150.2 lb

## 2017-12-03 DIAGNOSIS — R748 Abnormal levels of other serum enzymes: Secondary | ICD-10-CM | POA: Diagnosis not present

## 2017-12-03 DIAGNOSIS — F419 Anxiety disorder, unspecified: Secondary | ICD-10-CM

## 2017-12-03 DIAGNOSIS — D0511 Intraductal carcinoma in situ of right breast: Secondary | ICD-10-CM | POA: Diagnosis not present

## 2017-12-03 DIAGNOSIS — I471 Supraventricular tachycardia: Secondary | ICD-10-CM

## 2017-12-03 DIAGNOSIS — R739 Hyperglycemia, unspecified: Secondary | ICD-10-CM | POA: Diagnosis not present

## 2017-12-03 DIAGNOSIS — E7800 Pure hypercholesterolemia, unspecified: Secondary | ICD-10-CM

## 2017-12-03 DIAGNOSIS — G4700 Insomnia, unspecified: Secondary | ICD-10-CM

## 2017-12-03 MED ORDER — TRAZODONE HCL 50 MG PO TABS: ORAL_TABLET | ORAL | 1 refills | Status: DC

## 2017-12-03 NOTE — Progress Notes (Signed)
Patient ID: JALENA VANDERLINDEN, female   DOB: [birth year], 71 y.o.   MRN: [id number]   Subjective:    Patient ID: [name], female    DOB: [birth year], 72 y.o.   MRN: [id number]  HPI  Patient here for a scheduled follow up.  Increased stress.  Discussed with her last visit. Not sleeping.  Was placed on trazodone.  Feels helps some, but is still waking up through the night.  Discussed increasing the dose.  Still with increased stress. Discussed with her today.  Overall she feels she is handling things relatively well.  No chest pain.  No sob.  No acid reflux.  No abdominal pain.  Bowels moving.     Past Medical History:  Diagnosis Date  . Breast cancer ([place]) 12/2013   right breast cancer  . Chronic anxiety   . Dysrhythmia    SVT  . History of kidney stones   . Insomnia   . Nephrolithiasis   . Osteoporosis   . PAC (premature atrial contraction)   . Palpitations   . Personal history of radiation therapy 2015   Right breast  . SVT (supraventricular tachycardia) ([facility])    Past Surgical History:  Procedure Laterality Date  . BREAST CYST ASPIRATION Left 10+ yrs ago   neg  . BREAST EXCISIONAL BIOPSY Right 12/15/2013   DCIS  . BREAST SURGERY Right 12/15/2013   lumpectomy  . BUNIONECTOMY  early '70s  . COLONOSCOPY WITH PROPOFOL N/A 05/27/2017   Procedure: COLONOSCOPY WITH PROPOFOL;  Surgeon: Lollie Sails, MD;  Location: [facility] ENDOSCOPY;  Service: Endoscopy;  Laterality: N/A;  . DILATION AND CURETTAGE OF UTERUS  1973   after miscarriage  . LITHOTRIPSY     Family History  Problem Relation Age of Onset  . Aortic stenosis Mother   . Congestive Heart Failure Mother   . Aortic stenosis Father   . Coronary artery disease Father        Deceased from complications of atherosclerotic CAD and Atherosclerosis  . Congestive Heart Failure Father   . Parkinsonism Brother   . Colon polyps Sister   . Arthritis Maternal Grandfather   . Early death Paternal Grandmother   . Heart block Brother     . Stroke Maternal Grandmother   . Kidney disease Paternal Grandfather   . Breast cancer Maternal Aunt 70  . Ovarian cancer Maternal Aunt    Social History   Socioeconomic History  . Marital status: Married    Spouse name: Richard  . Number of children: 1  . Years of education: HS  . Highest education level: Not on file  Occupational History  . Occupation: Retired  Scientific laboratory technician  . Financial resource strain: Not hard at all  . Food insecurity:    Worry: Never true    Inability: Never true  . Transportation needs:    Medical: No    Non-medical: No  Tobacco Use  . Smoking status: Never Smoker  . Smokeless tobacco: Former Network engineer and Sexual Activity  . Alcohol use: Yes    Alcohol/week: 7.0 standard drinks    Types: 7 Glasses of wine per week  . Drug use: No  . Sexual activity: Not on file  Lifestyle  . Physical activity:    Days per week: 3 days    Minutes per session: 60 min  . Stress: Not at all  Relationships  . Social connections:    Talks on phone: Not on file    Gets together:  Not on file    Attends religious service: Not on file    Active member of club or organization: Not on file    Attends meetings of clubs or organizations: Not on file    Relationship status: Not on file  Other Topics Concern  . Not on file  Social History Narrative  . Not on file    Outpatient Encounter Medications as of 12/03/2017  Medication Sig  . ALPRAZolam (XANAX) 0.25 MG tablet Take 1 tablet (0.25 mg total) by mouth 2 (two) times daily as needed for anxiety.  Marland Kitchen atenolol (TENORMIN) 25 MG tablet TAKE ONE TABLET EVERY DAY  . Cholecalciferol 1000 units capsule Take 1 capsule (1,000 Units total) by mouth daily.  . Coenzyme Q10 100 MG TABS Take 1 tablet (100 mg total) by mouth daily.  . rosuvastatin (CRESTOR) 5 MG tablet TAKE ONE TABLET EVERY DAY  . traZODone (DESYREL) 50 MG tablet Take 1 1/2 tablet q hs prn  . [DISCONTINUED] traZODone (DESYREL) 50 MG tablet TAKE 1/2 TO 1  TABLET AT BEDTIME AS NEEDED FOR SLEEP   No facility-administered encounter medications on file as of 12/03/2017.     Review of Systems  Constitutional: Negative for appetite change and unexpected weight change.  HENT: Negative for congestion and sinus pressure.   Respiratory: Negative for cough, chest tightness and shortness of breath.   Cardiovascular: Negative for chest pain, palpitations and leg swelling.  Gastrointestinal: Negative for abdominal pain, diarrhea, nausea and vomiting.  Genitourinary: Negative for difficulty urinating and dysuria.  Musculoskeletal: Negative for joint swelling and myalgias.  Skin: Negative for color change and rash.  Neurological: Negative for dizziness, light-headedness and headaches.  Psychiatric/Behavioral: Positive for sleep disturbance. Negative for agitation and dysphoric mood.       Increased stress as outlined.         Objective:    Physical Exam  Constitutional: She appears well-developed and well-nourished. No distress.  HENT:  Nose: Nose normal.  Mouth/Throat: Oropharynx is clear and moist.  Neck: Neck supple. No thyromegaly present.  Cardiovascular: Normal rate and regular rhythm.  Pulmonary/Chest: Breath sounds normal. No respiratory distress. She has no wheezes.  Abdominal: Soft. Bowel sounds are normal. There is no tenderness.  Musculoskeletal: She exhibits no edema or tenderness.  Lymphadenopathy:    She has no cervical adenopathy.  Skin: No rash noted. No erythema.  Psychiatric: She has a normal mood and affect. Her behavior is normal.    BP 110/68 (BP Location: Left Arm, Patient Position: Sitting, Cuff Size: Normal)   Pulse 72   Temp 98.3 F (36.8 C) (Oral)   Resp 18   Wt 150 lb 3.2 oz (68.1 kg)   SpO2 98%   BMI 24.24 kg/m  Wt Readings from Last 3 Encounters:  12/03/17 150 lb 3.2 oz (68.1 kg)  10/21/17 149 lb 3.2 oz (67.7 kg)  08/20/17 149 lb 12.8 oz (67.9 kg)     Lab Results  Component Value Date   WBC 6.7  04/16/2017   HGB 13.5 04/16/2017   HCT 40.4 04/16/2017   PLT 250.0 04/16/2017   GLUCOSE 116 (H) 10/16/2017   CHOL 153 10/16/2017   TRIG 143.0 10/16/2017   HDL 56.90 10/16/2017   LDLDIRECT 92.0 12/11/2016   LDLCALC 67 10/16/2017   ALT 13 10/16/2017   AST 17 10/16/2017   NA 141 10/16/2017   K 4.3 10/16/2017   CL 105 10/16/2017   CREATININE 0.95 10/16/2017   BUN 14 10/16/2017   CO2  29 10/16/2017   TSH 1.76 04/16/2017   HGBA1C 5.3 10/16/2017    Mm Diag Breast Tomo Bilateral  Result Date: 11/25/2017 CLINICAL DATA:  Malignant lumpectomy of the UPPER OUTER QUADRANT of the RIGHT breast in September, 2015 with adjuvant radiation therapy. Annual evaluation. EXAM: DIGITAL DIAGNOSTIC BILATERAL MAMMOGRAM WITH CAD AND TOMO COMPARISON:  11/23/2016, 11/21/2015 and earlier. ACR Breast Density Category d: The breast tissue is extremely dense, which lowers the sensitivity of mammography. FINDINGS: Tomosynthesis and synthesized full field CC and MLO views of both breasts were obtained. Standard spot magnification MLO view of the lumpectomy site in the LEFT breast was also obtained. Post surgical scar/architectural distortion at the lumpectomy site in the UPPER OUTER RIGHT breast at MIDDLE to POSTERIOR depth. No new or suspicious findings in the RIGHT breast. No findings suspicious for malignancy in the LEFT breast. Mammographic images were processed with CAD. IMPRESSION: 1. No mammographic evidence of malignancy involving either breast. 2. Expected post lumpectomy changes involving the RIGHT breast. RECOMMENDATION: BILATERAL diagnostic mammography in 1 year. I have discussed the findings and recommendations with the patient. Results were also provided in writing at the conclusion of the visit. If applicable, a reminder letter will be sent to the patient regarding the next appointment. BI-RADS CATEGORY  2: Benign. Electronically Signed   By: Evangeline Dakin M.D.   On: 11/25/2017 13:28       Assessment &  Plan:   Problem List Items Addressed This Visit    Abnormal liver enzymes    Follow liver function tests.        Relevant Orders   Hepatic function panel   Anxiety    Increased stress as outlined.  Started on trazodone last visit to help with sleep. Will increase to 75mg  q day.  Follow.  Can continue to titrate if needed.        Relevant Medications   traZODone (DESYREL) 50 MG tablet   Ductal carcinoma in situ (DCIS) of right breast    Right breast DCIS.  S/p lumpectomy.  S/p XRT.  Is ER/PR positive.  Mammogram 11/25/17 - [facility] II.        Hypercholesteremia    On crestor.  Low cholesterol diet and exercise.  Follow lipid panel and liver function tests.        Relevant Orders   Basic metabolic panel   Lipid panel   Insomnia    Previously on ambien.  Started on trazodone last visit.  Helping,but still with sleep issues as outlined.  Will increase trazodone to 75mg  q day.  Follow.        Supraventricular tachycardia ([place])    Doing well on atenolol.  Follow.        Other Visit Diagnoses    Hyperglycemia    -  Primary   Relevant Orders   Hemoglobin A1c       Einar Pheasant, MD

## 2017-12-08 ENCOUNTER — Encounter: Payer: Self-pay | Admitting: Internal Medicine

## 2017-12-08 NOTE — Assessment & Plan Note (Signed)
Follow liver function tests.   

## 2017-12-08 NOTE — Assessment & Plan Note (Signed)
Previously on [place].  Started on trazodone last visit.  Helping,but still with sleep issues as outlined.  Will increase trazodone to 75mg  q day.  Follow.

## 2017-12-08 NOTE — Assessment & Plan Note (Signed)
Doing well on atenolol.  Follow.  

## 2017-12-08 NOTE — Assessment & Plan Note (Signed)
On crestor.  Low cholesterol diet and exercise.  Follow lipid panel and liver function tests.   

## 2017-12-08 NOTE — Assessment & Plan Note (Signed)
Increased stress as outlined.  Started on trazodone last visit to help with sleep. Will increase to 75mg  q day.  Follow.  Can continue to titrate if needed.

## 2017-12-08 NOTE — Assessment & Plan Note (Signed)
Right breast DCIS.  S/p lumpectomy.  S/p XRT.  Is ER/PR positive.  Mammogram 11/25/17 - [facility] II.

## 2018-01-08 DIAGNOSIS — D0590 Unspecified type of carcinoma in situ of unspecified breast: Secondary | ICD-10-CM | POA: Insufficient documentation

## 2018-01-21 ENCOUNTER — Other Ambulatory Visit (INDEPENDENT_AMBULATORY_CARE_PROVIDER_SITE_OTHER): Payer: Medicare Other

## 2018-01-21 DIAGNOSIS — E7800 Pure hypercholesterolemia, unspecified: Secondary | ICD-10-CM | POA: Diagnosis not present

## 2018-01-21 DIAGNOSIS — R739 Hyperglycemia, unspecified: Secondary | ICD-10-CM | POA: Diagnosis not present

## 2018-01-21 DIAGNOSIS — R748 Abnormal levels of other serum enzymes: Secondary | ICD-10-CM | POA: Diagnosis not present

## 2018-01-21 LAB — BASIC METABOLIC PANEL
BUN: 17 mg/dL (ref 6–23)
CO2: 30 meq/L (ref 19–32)
Calcium: 9.3 mg/dL (ref 8.4–10.5)
Chloride: 104 mEq/L (ref 96–112)
Creatinine, Ser: 1.09 mg/dL (ref 0.40–1.20)
GFR: 52.25 mL/min — AB (ref >60.00)
GLUCOSE: 96 mg/dL (ref 70–99)
POTASSIUM: 4.3 meq/L (ref 3.5–5.1)
SODIUM: 140 meq/L (ref 135–145)

## 2018-01-21 LAB — HEMOGLOBIN A1C: HEMOGLOBIN A1C: 5.1 % (ref 4.6–6.5)

## 2018-01-21 LAB — LDL CHOLESTEROL, DIRECT: Direct LDL: 104 mg/dL

## 2018-01-21 LAB — HEPATIC FUNCTION PANEL
ALBUMIN: 4.5 g/dL (ref 3.5–5.2)
ALT: 17 U/L (ref 0–35)
AST: 15 U/L (ref 0–37)
Alkaline Phosphatase: 57 U/L (ref 39–117)
Bilirubin, Direct: 0.1 mg/dL (ref 0.0–0.3)
Total Bilirubin: 0.7 mg/dL (ref 0.2–1.2)
Total Protein: 7.3 g/dL (ref 6.0–8.3)

## 2018-01-21 LAB — LIPID PANEL
CHOL/HDL RATIO: 3
Cholesterol: 166 mg/dL (ref 0–200)
HDL: 47.7 mg/dL (ref >39.00)
NonHDL: 117.87
Triglycerides: 206 mg/dL — ABNORMAL HIGH (ref 0.0–149.0)
VLDL: 41.2 mg/dL — AB (ref 0.0–40.0)

## 2018-01-22 ENCOUNTER — Encounter: Payer: Self-pay | Admitting: Internal Medicine

## 2018-01-22 ENCOUNTER — Ambulatory Visit (INDEPENDENT_AMBULATORY_CARE_PROVIDER_SITE_OTHER): Payer: Medicare Other | Admitting: Internal Medicine

## 2018-01-22 DIAGNOSIS — R748 Abnormal levels of other serum enzymes: Secondary | ICD-10-CM

## 2018-01-22 DIAGNOSIS — E7800 Pure hypercholesterolemia, unspecified: Secondary | ICD-10-CM

## 2018-01-22 DIAGNOSIS — I471 Supraventricular tachycardia: Secondary | ICD-10-CM | POA: Diagnosis not present

## 2018-01-22 DIAGNOSIS — R197 Diarrhea, unspecified: Secondary | ICD-10-CM

## 2018-01-22 DIAGNOSIS — G4700 Insomnia, unspecified: Secondary | ICD-10-CM | POA: Diagnosis not present

## 2018-01-22 MED ORDER — TRAZODONE HCL 100 MG PO TABS: 100.0000 mg | ORAL_TABLET | Freq: Every day | ORAL | 2 refills | Status: DC

## 2018-01-22 MED ORDER — ALPRAZOLAM 0.25 MG PO TABS: 0.2500 mg | ORAL_TABLET | Freq: Every day | ORAL | 0 refills | Status: DC | PRN

## 2018-01-22 NOTE — Progress Notes (Signed)
Patient ID: Claudia Little, female   DOB: 1945/11/13, 72 y.o.   MRN: [id number]   Subjective:    Patient ID: Claudia Little, female    DOB: 07-May-1945, 72 y.o.   MRN: [id number]  HPI  Patient here for a scheduled follow up.  Last visit was placed on 75mg  trazodone.  States she feels she is sleeping better.  Was having some diarrhea.  Was questioning if the trazodone contributed to her diarrhea.  She stopped in temporarily and feels it may have improved.  Back on trazodone now and diarrhea better.  States she has had issues with intermittent diarrhea for years.  Discussed not sure if trazodone is contributing.  She is sleeping better.  States otherwise doing well.  No chest pain.  No sob.  No acid reflux.  No abdominal pain.    Past Medical History:  Diagnosis Date  . Breast cancer ([place]) 12/2013   right breast cancer  . Chronic anxiety   . Dysrhythmia    SVT  . History of kidney stones   . Insomnia   . Nephrolithiasis   . Osteoporosis   . PAC (premature atrial contraction)   . Palpitations   . Personal history of radiation therapy 2015   Right breast  . SVT (supraventricular tachycardia) ([facility])    Past Surgical History:  Procedure Laterality Date  . BREAST CYST ASPIRATION Left 10+ yrs ago   neg  . BREAST EXCISIONAL BIOPSY Right 12/15/2013   DCIS  . BREAST SURGERY Right 12/15/2013   lumpectomy  . BUNIONECTOMY  early '70s  . COLONOSCOPY WITH PROPOFOL N/A 05/27/2017   Procedure: COLONOSCOPY WITH PROPOFOL;  Surgeon: Lollie Sails, MD;  Location: [facility] ENDOSCOPY;  Service: Endoscopy;  Laterality: N/A;  . DILATION AND CURETTAGE OF UTERUS  1973   after miscarriage  . LITHOTRIPSY     Family History  Problem Relation Age of Onset  . Aortic stenosis Mother   . Congestive Heart Failure Mother   . Aortic stenosis Father   . Coronary artery disease Father        Deceased from complications of atherosclerotic CAD and Atherosclerosis  . Congestive Heart Failure Father   . Parkinsonism  Brother   . Colon polyps Sister   . Arthritis Maternal Grandfather   . Early death Paternal Grandmother   . Heart block Brother   . Stroke Maternal Grandmother   . Kidney disease Paternal Grandfather   . Breast cancer Maternal Aunt 70  . Ovarian cancer Maternal Aunt    Social History   Socioeconomic History  . Marital status: Married    Spouse name: Richard  . Number of children: 1  . Years of education: HS  . Highest education level: Not on file  Occupational History  . Occupation: Retired  Scientific laboratory technician  . Financial resource strain: Not hard at all  . Food insecurity:    Worry: Never true    Inability: Never true  . Transportation needs:    Medical: No    Non-medical: No  Tobacco Use  . Smoking status: Never Smoker  . Smokeless tobacco: Former Network engineer and Sexual Activity  . Alcohol use: Yes    Alcohol/week: 7.0 standard drinks    Types: 7 Glasses of wine per week  . Drug use: No  . Sexual activity: Not on file  Lifestyle  . Physical activity:    Days per week: 3 days    Minutes per session: 60 min  . Stress: Not  at all  Relationships  . Social connections:    Talks on phone: Not on file    Gets together: Not on file    Attends religious service: Not on file    Active member of club or organization: Not on file    Attends meetings of clubs or organizations: Not on file    Relationship status: Not on file  Other Topics Concern  . Not on file  Social History Narrative  . Not on file    Outpatient Encounter Medications as of 01/22/2018  Medication Sig  . ALPRAZolam (XANAX) 0.25 MG tablet Take 1 tablet (0.25 mg total) by mouth daily as needed for anxiety.  Marland Kitchen atenolol (TENORMIN) 25 MG tablet TAKE ONE TABLET EVERY DAY  . Cholecalciferol 1000 units capsule Take 1 capsule (1,000 Units total) by mouth daily.  . Coenzyme Q10 100 MG TABS Take 1 tablet (100 mg total) by mouth daily.  . rosuvastatin (CRESTOR) 5 MG tablet TAKE ONE TABLET EVERY DAY  . traZODone  (DESYREL) 100 MG tablet Take 1 tablet (100 mg total) by mouth at bedtime.  . [DISCONTINUED] ALPRAZolam (XANAX) 0.25 MG tablet Take 1 tablet (0.25 mg total) by mouth 2 (two) times daily as needed for anxiety.  . [DISCONTINUED] traZODone (DESYREL) 50 MG tablet Take 1 1/2 tablet q hs prn   No facility-administered encounter medications on file as of 01/22/2018.     Review of Systems  Constitutional: Negative for appetite change and unexpected weight change.  HENT: Negative for congestion and sinus pressure.   Respiratory: Negative for cough, chest tightness and shortness of breath.   Cardiovascular: Negative for chest pain, palpitations and leg swelling.  Gastrointestinal: Positive for diarrhea. Negative for abdominal pain, nausea and vomiting.  Genitourinary: Negative for difficulty urinating and dysuria.  Musculoskeletal: Negative for joint swelling and myalgias.  Skin: Negative for color change and rash.  Neurological: Negative for dizziness, light-headedness and headaches.  Psychiatric/Behavioral: Negative for agitation and dysphoric mood.       Objective:    Physical Exam  Constitutional: She appears well-developed and well-nourished. No distress.  HENT:  Nose: Nose normal.  Mouth/Throat: Oropharynx is clear and moist.  Neck: Neck supple. No thyromegaly present.  Cardiovascular: Normal rate and regular rhythm.  Pulmonary/Chest: Breath sounds normal. No respiratory distress. She has no wheezes.  Abdominal: Soft. Bowel sounds are normal. There is no tenderness.  Musculoskeletal: She exhibits no edema or tenderness.  Lymphadenopathy:    She has no cervical adenopathy.  Skin: No rash noted. No erythema.  Psychiatric: She has a normal mood and affect. Her behavior is normal.    BP 112/62 (BP Location: Left Arm, Patient Position: Sitting, Cuff Size: Normal)   Pulse 60   Temp 97.7 F (36.5 C) (Oral)   Resp 18   Wt 150 lb 6.4 oz (68.2 kg)   SpO2 98%   BMI 24.28 kg/m  Wt  Readings from Last 3 Encounters:  01/22/18 150 lb 6.4 oz (68.2 kg)  12/03/17 150 lb 3.2 oz (68.1 kg)  10/21/17 149 lb 3.2 oz (67.7 kg)     Lab Results  Component Value Date   WBC 6.7 04/16/2017   HGB 13.5 04/16/2017   HCT 40.4 04/16/2017   PLT 250.0 04/16/2017   GLUCOSE 96 01/21/2018   CHOL 166 01/21/2018   TRIG 206.0 (H) 01/21/2018   HDL 47.70 01/21/2018   LDLDIRECT 104.0 01/21/2018   LDLCALC 67 10/16/2017   ALT 17 01/21/2018   AST 15 01/21/2018  NA 140 01/21/2018   K 4.3 01/21/2018   CL 104 01/21/2018   CREATININE 1.09 01/21/2018   BUN 17 01/21/2018   CO2 30 01/21/2018   TSH 1.76 04/16/2017   HGBA1C 5.1 01/21/2018    Mm Diag Breast Tomo Bilateral  Result Date: 11/25/2017 CLINICAL DATA:  Malignant lumpectomy of the UPPER OUTER QUADRANT of the RIGHT breast in September, 2015 with adjuvant radiation therapy. Annual evaluation. EXAM: DIGITAL DIAGNOSTIC BILATERAL MAMMOGRAM WITH CAD AND TOMO COMPARISON:  11/23/2016, 11/21/2015 and earlier. ACR Breast Density Category d: The breast tissue is extremely dense, which lowers the sensitivity of mammography. FINDINGS: Tomosynthesis and synthesized full field CC and MLO views of both breasts were obtained. Standard spot magnification MLO view of the lumpectomy site in the LEFT breast was also obtained. Post surgical scar/architectural distortion at the lumpectomy site in the UPPER OUTER RIGHT breast at MIDDLE to POSTERIOR depth. No new or suspicious findings in the RIGHT breast. No findings suspicious for malignancy in the LEFT breast. Mammographic images were processed with CAD. IMPRESSION: 1. No mammographic evidence of malignancy involving either breast. 2. Expected post lumpectomy changes involving the RIGHT breast. RECOMMENDATION: BILATERAL diagnostic mammography in 1 year. I have discussed the findings and recommendations with the patient. Results were also provided in writing at the conclusion of the visit. If applicable, a reminder  letter will be sent to the patient regarding the next appointment. BI-RADS CATEGORY  2: Benign. Electronically Signed   By: Evangeline Dakin M.D.   On: 11/25/2017 13:28       Assessment & Plan:   Problem List Items Addressed This Visit    Abnormal liver enzymes    Follow liver function tests.        Diarrhea    Has had flares for years.  Discussed with her today.  Better.  Start probiotics as directed.  Follow.  Will require further w/up if persistent diarrhea.        Hypercholesteremia    On crestor.  Low cholesterol diet and exercise.  Follow lipid panel and liver function tests.        Insomnia    Has had issues with sleep for years.  On trazodone.  Feels like is helping now.  Unclear if diarrhea coming from trazodone.  Diarrhea better now.  Has had flares for years.  Will increase trazodone to 100mg  q hs.  Follow closely. Notify me if diarrhea returns.        Supraventricular tachycardia ([place])    Doing well on atenolol.  Follow.            Einar Pheasant, MD

## 2018-01-22 NOTE — Patient Instructions (Signed)
Examples of probiotics:  Culturelle, align or florastor  Increase trazodone to 100mg  before bed.

## 2018-01-23 ENCOUNTER — Encounter: Payer: Self-pay | Admitting: Internal Medicine

## 2018-01-25 ENCOUNTER — Encounter: Payer: Self-pay | Admitting: Internal Medicine

## 2018-01-25 DIAGNOSIS — R197 Diarrhea, unspecified: Secondary | ICD-10-CM | POA: Insufficient documentation

## 2018-01-25 NOTE — Assessment & Plan Note (Signed)
Follow liver function tests.   

## 2018-01-25 NOTE — Assessment & Plan Note (Signed)
On crestor.  Low cholesterol diet and exercise.  Follow lipid panel and liver function tests.   

## 2018-01-25 NOTE — Assessment & Plan Note (Signed)
Doing well on atenolol.  Follow.  

## 2018-01-25 NOTE — Assessment & Plan Note (Signed)
Has had flares for years.  Discussed with her today.  Better.  Start probiotics as directed.  Follow.  Will require further w/up if persistent diarrhea.

## 2018-01-25 NOTE — Assessment & Plan Note (Signed)
Has had issues with sleep for years.  On trazodone.  Feels like is helping now.  Unclear if diarrhea coming from trazodone.  Diarrhea better now.  Has had flares for years.  Will increase trazodone to 100mg  q hs.  Follow closely. Notify me if diarrhea returns.

## 2018-01-27 ENCOUNTER — Other Ambulatory Visit: Payer: Self-pay | Admitting: Internal Medicine

## 2018-01-27 DIAGNOSIS — E7800 Pure hypercholesterolemia, unspecified: Secondary | ICD-10-CM

## 2018-02-25 ENCOUNTER — Other Ambulatory Visit: Payer: Self-pay | Admitting: Internal Medicine

## 2018-02-25 DIAGNOSIS — I471 Supraventricular tachycardia: Secondary | ICD-10-CM

## 2018-03-13 ENCOUNTER — Other Ambulatory Visit: Payer: Self-pay | Admitting: Internal Medicine

## 2018-03-13 DIAGNOSIS — E7800 Pure hypercholesterolemia, unspecified: Secondary | ICD-10-CM

## 2018-03-21 ENCOUNTER — Ambulatory Visit: Payer: Medicare Other | Admitting: Internal Medicine

## 2018-05-01 ENCOUNTER — Encounter: Payer: Self-pay | Admitting: Radiation Oncology

## 2018-05-01 ENCOUNTER — Ambulatory Visit
Admission: RE | Admit: 2018-05-01 | Discharge: 2018-05-01 | Disposition: A | Discharge status: Home / Self Care | Payer: Medicare Other | Source: Ambulatory Visit | Attending: Radiation Oncology | Admitting: Radiation Oncology

## 2018-05-01 ENCOUNTER — Other Ambulatory Visit: Payer: Self-pay

## 2018-05-01 VITALS — BP 134/62 | HR 65 | Resp 18 | Wt 152.6 lb

## 2018-05-01 DIAGNOSIS — D0511 Intraductal carcinoma in situ of right breast: Secondary | ICD-10-CM

## 2018-05-01 DIAGNOSIS — Z923 Personal history of irradiation: Secondary | ICD-10-CM | POA: Insufficient documentation

## 2018-05-01 DIAGNOSIS — Z86000 Personal history of in-situ neoplasm of breast: Secondary | ICD-10-CM | POA: Diagnosis present

## 2018-05-01 NOTE — Progress Notes (Signed)
Radiation Oncology Follow up Note  Name: Claudia Little   Date:   05/01/2018 MRN:  [id number] DOB: 03-Jan-1946    This 73 y.o. female presents to the clinic today for 3-1/2 year follow-up status post whole breast radiation to her right breast for ductal carcinoma in situ ER/PR negative.  REFERRING PROVIDER: Einar Pheasant, MD  HPI: patient is a 73 year old female now seen out 3-1/2 years having completed whole breast radiation to her right breast for ER/PR negative ductal carcinoma in situ. She is seen today in routine follow-up continues to do well. She specifically denies breast tenderness cough or bone pain. She is not on antiestrogen therapy based on the negative ER/PR status of her tumor..her last mammogram which I have reviewed was back in August was a BI-RADS 2 benign  COMPLICATIONS OF TREATMENT: none  FOLLOW UP COMPLIANCE: keeps appointments   PHYSICAL EXAM:  BP 134/62 (BP Location: Left Arm, Patient Position: Sitting)   Pulse 65   Resp 18   Wt 152 lb 8.9 oz (69.2 kg)   BMI 24.62 kg/m  Lungs are clear to A&P cardiac examination essentially unremarkable with regular rate and rhythm. No dominant mass or nodularity is noted in either breast in 2 positions examined. Incision is well-healed. No axillary or supraclavicular adenopathy is appreciated. Cosmetic result is excellent.Well-developed well-nourished patient in NAD. HEENT reveals PERLA, EOMI, discs not visualized.  Oral cavity is clear. No oral mucosal lesions are identified. Neck is clear without evidence of cervical or supraclavicular adenopathy. Lungs are clear to A&P. Cardiac examination is essentially unremarkable with regular rate and rhythm without murmur rub or thrill. Abdomen is benign with no organomegaly or masses noted. Motor sensory and DTR levels are equal and symmetric in the upper and lower extremities. Cranial nerves II through XII are grossly intact. Proprioception is intact. No peripheral adenopathy or edema is  identified. No motor or sensory levels are noted. Crude visual fields are within normal range.  RADIOLOGY RESULTS: mammograms reviewed and compatible with the above-stated findings  PLAN: the present time she continues to do well with no evidence of disease. I'll see her back in 1 year for follow-up and then discontinue follow-up care. Patient is to call at anytime with any concerns.  I would like to take this opportunity to thank you for allowing me to participate in the care of your patient.Noreene Filbert, MD

## 2018-05-01 NOTE — Progress Notes (Signed)
Radiation Oncology Follow up Note  Name: Claudia Little   Date:   05/01/2018 MRN:  [id number] DOB: 1945/06/23    This 73 y.o. female presents to the clinic today for 3-1/2 year follow-up status post whole breast radiation to her right breast for ductal carcinoma in situ ER/PR negative.  REFERRING PROVIDER: Einar Pheasant, MD  HPI: patient is a 73 year old female now seen out 3-1/2 years having completed whole breast radiation to her right breast for ER/PR negative ductal carcinoma in situ. She is seen today in routine follow-up continues to do well. She specifically denies breast tenderness cough or bone pain. She is not on antiestrogen therapy based on the negative ER/PR status of her tumor..her last mammogram which I have reviewed was back in August was a BI-RADS 2 benign  COMPLICATIONS OF TREATMENT: none  FOLLOW UP COMPLIANCE: keeps appointments   PHYSICAL EXAM:  BP 134/62 (BP Location: Left Arm, Patient Position: Sitting)   Pulse 65   Resp 18   Wt 152 lb 8.9 oz (69.2 kg)   BMI 24.62 kg/m  Lungs are clear to A&P cardiac examination essentially unremarkable with regular rate and rhythm. No dominant mass or nodularity is noted in either breast in 2 positions examined. Incision is well-healed. No axillary or supraclavicular adenopathy is appreciated. Cosmetic result is excellent.Well-developed well-nourished patient in NAD. HEENT reveals PERLA, EOMI, discs not visualized.  Oral cavity is clear. No oral mucosal lesions are identified. Neck is clear without evidence of cervical or supraclavicular adenopathy. Lungs are clear to A&P. Cardiac examination is essentially unremarkable with regular rate and rhythm without murmur rub or thrill. Abdomen is benign with no organomegaly or masses noted. Motor sensory and DTR levels are equal and symmetric in the upper and lower extremities. Cranial nerves II through XII are grossly intact. Proprioception is intact. No peripheral adenopathy or edema is  identified. No motor or sensory levels are noted. Crude visual fields are within normal range.  RADIOLOGY RESULTS: mammograms reviewed and compatible with the above-stated findings  PLAN: the present time she continues to do well with no evidence of disease. I'll see her back in 1 year for follow-up and then discontinue follow-up care. Patient is to call at anytime with any concerns.  I would like to take this opportunity to thank you for allowing me to participate in the care of your patient.Noreene Filbert, MD

## 2018-07-08 ENCOUNTER — Other Ambulatory Visit: Payer: Self-pay

## 2018-07-08 NOTE — Patient Outreach (Signed)
[facility]) Care Management  07/08/2018  Tracee Mccreery [facility] 28-Jun-1945 [id number]   Medication Adherence call to Mrs. Hazelee Harbold spoke with patient she is due on Rosuvastatin 5 mg patient was hesitance on providing her information but, said she has plenty of medication at this time. Patient is showing due under [place].   [place] Management Direct Dial [phone]  Fax [phone] [email or web address]

## 2018-08-20 ENCOUNTER — Telehealth: Payer: Self-pay

## 2018-08-20 NOTE — Telephone Encounter (Signed)
Copied from [place] [phone]. Topic: Appointment Scheduling - Scheduling Inquiry for Clinic >> Aug 20, 2018 11:26 AM Margot Ables wrote: Reason for CRM: No answer upon calling office - pt asking if she needs to keep visit 5/22 and would like call back to discuss. She said she can do virtual if it is necessary. Pt has a smartphone.       Appointment scheduled and a physical was scheduled with pt for July.  Elmer Boutelle,cma

## 2018-08-22 ENCOUNTER — Encounter: Payer: Self-pay | Admitting: Internal Medicine

## 2018-08-22 ENCOUNTER — Other Ambulatory Visit: Payer: Self-pay

## 2018-08-22 ENCOUNTER — Ambulatory Visit (INDEPENDENT_AMBULATORY_CARE_PROVIDER_SITE_OTHER): Payer: Medicare Other

## 2018-08-22 ENCOUNTER — Ambulatory Visit (INDEPENDENT_AMBULATORY_CARE_PROVIDER_SITE_OTHER): Payer: Medicare Other | Admitting: Internal Medicine

## 2018-08-22 DIAGNOSIS — R197 Diarrhea, unspecified: Secondary | ICD-10-CM | POA: Diagnosis not present

## 2018-08-22 DIAGNOSIS — Z0000 Encounter for general adult medical examination without abnormal findings: Secondary | ICD-10-CM | POA: Diagnosis not present

## 2018-08-22 DIAGNOSIS — I471 Supraventricular tachycardia, unspecified: Secondary | ICD-10-CM

## 2018-08-22 DIAGNOSIS — E7800 Pure hypercholesterolemia, unspecified: Secondary | ICD-10-CM | POA: Diagnosis not present

## 2018-08-22 DIAGNOSIS — R748 Abnormal levels of other serum enzymes: Secondary | ICD-10-CM

## 2018-08-22 DIAGNOSIS — F419 Anxiety disorder, unspecified: Secondary | ICD-10-CM

## 2018-08-22 NOTE — Progress Notes (Signed)
Subjective:   Claudia Little is a 73 y.o. female who presents for Medicare Annual (Subsequent) preventive examination.  Review of Systems:  No ROS.  Medicare Wellness Virtual Visit.  Visual/audio telehealth visit, UTA vital signs.   See social history for additional risk factors.   Cardiac Risk Factors include: advanced age (>5men, >20 women)     Objective:     Vitals: There were no vitals taken for this visit.  There is no height or weight on file to calculate BMI.  Advanced Directives 08/22/2018 05/01/2018 08/20/2017 05/27/2017 04/17/2017 03/13/2016 11/23/2015  Does Patient Have a Medical Advance Directive? Yes No Yes Yes No No Yes  Type of Advance Directive Living will;Healthcare Power of [place];Living will [place];Living will - - [place];Living will  Does patient want to make changes to medical advance directive? No - Patient declined - No - Patient declined - - - No - Patient declined  Copy of [place] in Chart? No - copy requested - No - copy requested Yes - - No - copy requested  Would patient like information on creating a medical advance directive? - No - Patient declined - - No - Patient declined No - Patient declined -    Tobacco Social History   Tobacco Use  Smoking Status Never Smoker  Smokeless Tobacco Former Engineer, structural given: Not Answered   Clinical Intake:  Pre-visit preparation completed: Yes        Diabetes: No  How often do you need to have someone help you when you read instructions, pamphlets, or other written materials from your doctor or pharmacy?: 1 - Never  Interpreter Needed?: No     Past Medical History:  Diagnosis Date  . Breast cancer ([place]) 12/2013   right breast cancer  . Chronic anxiety   . Dysrhythmia    SVT  . History of kidney stones   . Insomnia   . Nephrolithiasis   . Osteoporosis   . PAC (premature atrial contraction)    . Palpitations   . Personal history of radiation therapy 2015   Right breast  . SVT (supraventricular tachycardia) (HCC)    Past Surgical History:  Procedure Laterality Date  . BREAST CYST ASPIRATION Left 10+ yrs ago   neg  . BREAST EXCISIONAL BIOPSY Right 12/15/2013   DCIS  . BREAST SURGERY Right 12/15/2013   lumpectomy  . BUNIONECTOMY  early '70s  . COLONOSCOPY WITH PROPOFOL N/A 05/27/2017   Procedure: COLONOSCOPY WITH PROPOFOL;  Surgeon: Lollie Sails, MD;  Location: [facility] ENDOSCOPY;  Service: Endoscopy;  Laterality: N/A;  . DILATION AND CURETTAGE OF UTERUS  1973   after miscarriage  . LITHOTRIPSY     Family History  Problem Relation Age of Onset  . Aortic stenosis Mother   . Congestive Heart Failure Mother   . Aortic stenosis Father   . Coronary artery disease Father        Deceased from complications of atherosclerotic CAD and Atherosclerosis  . Congestive Heart Failure Father   . Parkinsonism Brother   . Colon polyps Sister   . Arthritis Maternal Grandfather   . Early death Paternal Grandmother   . Heart block Brother   . Stroke Maternal Grandmother   . Kidney disease Paternal Grandfather   . Breast cancer Maternal Aunt 70  . Ovarian cancer Maternal Aunt    Social History   Socioeconomic History  . Marital  status: Married    Spouse name: Richard  . Number of children: 1  . Years of education: HS  . Highest education level: Not on file  Occupational History  . Occupation: Retired  Scientific laboratory technician  . Financial resource strain: Not hard at all  . Food insecurity:    Worry: Never true    Inability: Never true  . Transportation needs:    Medical: No    Non-medical: No  Tobacco Use  . Smoking status: Never Smoker  . Smokeless tobacco: Former Network engineer and Sexual Activity  . Alcohol use: Yes    Alcohol/week: 7.0 standard drinks    Types: 7 Glasses of wine per week  . Drug use: No  . Sexual activity: Not Currently  Lifestyle  . Physical activity:     Days per week: 3 days    Minutes per session: 60 min  . Stress: Not at all  Relationships  . Social connections:    Talks on phone: Not on file    Gets together: Not on file    Attends religious service: Not on file    Active member of club or organization: Not on file    Attends meetings of clubs or organizations: Not on file    Relationship status: Not on file  Other Topics Concern  . Not on file  Social History Narrative  . Not on file    Outpatient Encounter Medications as of 08/22/2018  Medication Sig  . ALPRAZolam (XANAX) 0.25 MG tablet Take 1 tablet (0.25 mg total) by mouth daily as needed for anxiety.  Marland Kitchen atenolol (TENORMIN) 25 MG tablet TAKE ONE TABLET BY MOUTH EVERY DAY  . Cholecalciferol 1000 units capsule Take 1 capsule (1,000 Units total) by mouth daily.  . Coenzyme Q10 100 MG TABS Take 1 tablet (100 mg total) by mouth daily.  . Probiotic Product (PROBIOTIC DAILY PO) Take 1 capsule by mouth daily.  . rosuvastatin (CRESTOR) 5 MG tablet TAKE 1 TABLET BY MOUTH DAILY   No facility-administered encounter medications on file as of 08/22/2018.     Activities of Daily Living In your present state of health, do you have any difficulty performing the following activities: 08/22/2018  Hearing? N  Vision? N  Difficulty concentrating or making decisions? N  Walking or climbing stairs? N  Dressing or bathing? N  Doing errands, shopping? N  Preparing Food and eating ? N  Using the Toilet? N  In the past six months, have you accidently leaked urine? N  Do you have problems with loss of bowel control? N  Managing your Medications? N  Managing your Finances? N  Housekeeping or managing your Housekeeping? N  Some recent data might be hidden    Patient Care Team: Einar Pheasant, MD as PCP - General (Internal Medicine)    Assessment:   This is a routine wellness examination for Claudia Little.  I connected with patient 08/22/18 at 10:30 AM EDT by a video/audio enabled  telemedicine application and verified that I am speaking with the correct person using two identifiers. Patient stated full name and DOB. Patient gave permission to continue with virtual visit. Patient's location was at home and Nurse's location was at [place] office.   Patient states she is having a reoccurring intestinal issue with intermittent loose bowel movements x3 weeks. Unsure if triggered by food or anxiety. She does not take probiotic as a scheduled medication but only when this issue occurs.  Deferred to pcp for follow up.  Afternoon appointment scheduled today, virtually. Agrees to keep all routine maintenance appointments.   Health Screenings  Mammogram - 10/2017 Colonoscopy - 05/2017 Bone Density - 07/2016 Glaucoma -none Hearing -demonstrates normal hearing during visit. Hemoglobin A1C - 12/2017 Cholesterol - 09/2017 Dental- visits every 6 months. Vision- visits within the last 12 months.  Social  Alcohol intake -  yes      Smoking history- never   Smokers in home? none Illicit drug use? none Exercise - none. She plans to start walking.  Diet - regular Sexually Active -not currently BMI- discussed the importance of a healthy diet, water intake and the benefits of aerobic exercise.  Educational material provided.   Safety  Patient feels safe at home- yes Patient does have smoke detectors at home- yes Patient does wear sunscreen or protective clothing when in direct sunlight -yes Patient does wear seat belt when in a moving vehicle -yes  Covid-19 precautions and sickness symptoms discussed.   Activities of Daily Living Patient denies needing assistance with: driving, household chores, feeding themselves, getting from bed to chair, getting to the toilet, bathing/showering, dressing, managing money, or preparing meals.  No new identified risk were noted.    Depression Screen Patient denies losing interest in daily life, feeling hopeless, or crying easily over simple  problems.   Medication-taking as directed and without issues.   Fall Screen Patient denies being afraid of falling or falling in the last year.   Memory Screen Patient is alert.  Patient denies difficulty focusing, concentrating or misplacing items. Correctly identified the president of the [place] , season and recall. Patient likes to read and play cards for brain stimulation.  Immunizations The following Immunizations were discussed: Influenza, shingles, pneumonia, and tetanus.   Other Providers Patient Care Team: Einar Pheasant, MD as PCP - General (Internal Medicine)  Exercise Activities and Dietary recommendations Current Exercise Habits: Home exercise routine, Type of exercise: walking, Intensity: Mild  Goals      Patient Stated   . DIET -LOW CHOLESTEROL DIET (pt-stated)    . Exercise 150 minutes per week (moderate activity) (pt-stated)     She plans to increase her physical activity when possible. Prior to covid 19 she was doing yoga and walking daily.        Fall Risk Fall Risk  08/22/2018 08/20/2017 04/17/2017 05/22/2016 03/24/2015  Falls in the past year? 0 No No No No   Depression Screen PHQ 2/9 Scores 08/22/2018 08/20/2017 04/17/2017 05/22/2016  PHQ - 2 Score 0 0 0 0     Cognitive Function MMSE - Mini Mental State Exam 08/20/2017  Orientation to time 5  Orientation to Place 5  Registration 3  Attention/ Calculation 5  Recall 3  Language- name 2 objects 2  Language- repeat 1  Language- follow 3 step command 3  Language- read & follow direction 1  Write a sentence 1  Copy design 1  Total score 30     6CIT Screen 08/22/2018  What Year? 0 points  What month? 0 points  What time? 0 points  Count back from 20 0 points  Months in reverse 0 points  Repeat phrase 0 points  Total Score 0   Immunization History  Administered Date(s) Administered  . Influenza Split 02/14/2012  . Influenza-Unspecified 01/24/2017, 12/03/2017  . Pneumococcal Conjugate-13  03/22/2014, 04/10/2017  . Pneumococcal Polysaccharide-23 01/16/2011, 04/10/2017  . Tdap 03/22/2014  . Zoster 07/13/2010  . Zoster Recombinat (Shingrix) 08/21/2017, 11/11/2017   Screening Tests Health Maintenance  Topic  Date Due  . INFLUENZA VACCINE  11/01/2018  . MAMMOGRAM  11/26/2019  . TETANUS/TDAP  03/22/2024  . COLONOSCOPY  05/28/2027  . DEXA SCAN  Completed  . Hepatitis C Screening  Completed  . PNA vac Low Risk Adult  Completed      Plan:    End of life planning; Advance aging; Advanced directives discussed.  Copy of current HCPOA/Living Will requested.    I have personally reviewed and noted the following in the patient's chart:   . Medical and social history . Use of alcohol, tobacco or illicit drugs  . Current medications and supplements . Functional ability and status . Nutritional status . Physical activity . Advanced directives . List of other physicians . Hospitalizations, surgeries, and ER visits in previous 12 months . Vitals . Screenings to include cognitive, depression, and falls . Referrals and appointments  In addition, I have reviewed and discussed with patient certain preventive protocols, quality metrics, and best practice recommendations. A written personalized care plan for preventive services as well as general preventive health recommendations were provided to patient.     Varney Biles, LPN  7/71/1657   Reviewed above information.  Agree with assessment and plan.  Will discuss bowel issues with her during her pm appt today.   Dr Nicki Reaper

## 2018-08-22 NOTE — Progress Notes (Signed)
Patient ID: Claudia Little, female   DOB: 09-Aug-1945, 73 y.o.   MRN: [id number]   Virtual Visit via video Note  This visit type was conducted due to national recommendations for restrictions regarding the COVID-19 pandemic (e.g. social distancing).  This format is felt to be most appropriate for this patient at this time.  All issues noted in this document were discussed and addressed.  No physical exam was performed (except for noted visual exam findings with Video Visits).   I connected with Mireille Keahey by a video enabled telemedicine application and verified that I am speaking with the correct person using two identifiers. Location patient: home Location provider: work Persons participating in the virtual visit: patient, provider  I discussed the limitations, risks, security and privacy concerns of performing an evaluation and management service by video and the availability of in person appointments.  The patient expressed understanding and agreed to proceed.   Reason for visit: scheduled follow up.   HPI: She reports she is having diarrhea for the last 3 weeks.  Describes liquid stools.  May have 1-6 stools per day.  No blood.  Some cramping.  Resolves after having a bowel movement.  No fever.  Eating.  No nausea or vomiting.  Not taking probiotic regularly.  Had colonoscopy 05/2017.  Recommended f/u in 5 years.  Off trazodone.  Sleeping better.  On atenolol.  Heart rate controlled.  No chest pain.  No sob.  Staying in due to COVID restrictions.  No cough or congestion.     ROS: See pertinent positives and negatives per HPI.  Past Medical History:  Diagnosis Date  . Breast cancer ([place]) 12/2013   right breast cancer  . Chronic anxiety   . Dysrhythmia    SVT  . History of kidney stones   . Insomnia   . Nephrolithiasis   . Osteoporosis   . PAC (premature atrial contraction)   . Palpitations   . Personal history of radiation therapy 2015   Right breast  . SVT (supraventricular  tachycardia) (HCC)     Past Surgical History:  Procedure Laterality Date  . BREAST CYST ASPIRATION Left 10+ yrs ago   neg  . BREAST EXCISIONAL BIOPSY Right 12/15/2013   DCIS  . BREAST SURGERY Right 12/15/2013   lumpectomy  . BUNIONECTOMY  early '70s  . COLONOSCOPY WITH PROPOFOL N/A 05/27/2017   Procedure: COLONOSCOPY WITH PROPOFOL;  Surgeon: Lollie Sails, MD;  Location: [facility] ENDOSCOPY;  Service: Endoscopy;  Laterality: N/A;  . DILATION AND CURETTAGE OF UTERUS  1973   after miscarriage  . LITHOTRIPSY      Family History  Problem Relation Age of Onset  . Aortic stenosis Mother   . Congestive Heart Failure Mother   . Aortic stenosis Father   . Coronary artery disease Father        Deceased from complications of atherosclerotic CAD and Atherosclerosis  . Congestive Heart Failure Father   . Parkinsonism Brother   . Colon polyps Sister   . Arthritis Maternal Grandfather   . Early death Paternal Grandmother   . Heart block Brother   . Stroke Maternal Grandmother   . Kidney disease Paternal Grandfather   . Breast cancer Maternal Aunt 70  . Ovarian cancer Maternal Aunt     SOCIAL HX: reviewed.    Current Outpatient Medications:  .  ALPRAZolam (XANAX) 0.25 MG tablet, Take 1 tablet (0.25 mg total) by mouth daily as needed for anxiety., Disp: 30 tablet, Rfl: 0 .  atenolol (TENORMIN) 25 MG tablet, TAKE ONE TABLET BY MOUTH EVERY DAY, Disp: 90 tablet, Rfl: 1 .  Cholecalciferol 1000 units capsule, Take 1 capsule (1,000 Units total) by mouth daily., Disp: , Rfl:  .  Coenzyme Q10 100 MG TABS, Take 1 tablet (100 mg total) by mouth daily., Disp: 30 tablet, Rfl: 0 .  Probiotic Product (PROBIOTIC DAILY PO), Take 1 capsule by mouth daily., Disp: , Rfl:  .  rosuvastatin (CRESTOR) 5 MG tablet, TAKE 1 TABLET BY MOUTH DAILY, Disp: 30 tablet, Rfl: 1  EXAM:  GENERAL: alert, oriented, appears well and in no acute distress  HEENT: atraumatic, conjunttiva clear, no obvious abnormalities on  inspection of external nose and ears  NECK: normal movements of the head and neck  LUNGS: on inspection no signs of respiratory distress, breathing rate appears normal, no obvious gross SOB, gasping or wheezing  CV: no obvious cyanosis  PSYCH/NEURO: pleasant and cooperative, no obvious depression or anxiety, speech and thought processing grossly intact  ASSESSMENT AND PLAN:  Discussed the following assessment and plan:  Diarrhea, unspecified type - Plan: Gastrointestinal Pathogen Panel PCR, CBC with Differential/Platelet, Hepatic function panel, CANCELED: Gastrointestinal Pathogen Panel PCR  Abnormal liver enzymes - Plan: Hepatic function panel  Anxiety  Hypercholesteremia - Plan: Lipid panel  Supraventricular tachycardia (HCC) - Plan: TSH, Basic metabolic panel  Abnormal liver enzymes Follow liver function tests.    Anxiety Doing better.  Son got a job.  Handling things better.  Sleeping better.  Follow.    Diarrhea Persistent.  Check stool studies to confirm no infectious etiology.  Probiotic daily.    Hypercholesteremia On crestor.  Low cholesterol diet and exercise.  Follow lipid panel and liver function tests.    Supraventricular tachycardia Doing well on atenolol.      I discussed the assessment and treatment plan with the patient. The patient was provided an opportunity to ask questions and all were answered. The patient agreed with the plan and demonstrated an understanding of the instructions.   The patient was advised to call back or seek an in-person evaluation if the symptoms worsen or if the condition fails to improve as anticipated.    Einar Pheasant, MD

## 2018-08-22 NOTE — Patient Instructions (Addendum)
  Claudia Little , Thank you for taking time to come for your Medicare Wellness Visit. I appreciate your ongoing commitment to your health goals. Please review the following plan we discussed and let me know if I can assist you in the future.   These are the goals we discussed: Goals      Patient Stated   . DIET -LOW CHOLESTEROL DIET (pt-stated)    . Exercise 150 minutes per week (moderate activity) (pt-stated)     She plans to increase her physical activity when possible. Prior to covid 19 she was doing yoga and walking daily.        This is a list of the screening recommended for you and due dates:  Health Maintenance  Topic Date Due  . Flu Shot  11/01/2018  . Mammogram  11/26/2019  . Tetanus Vaccine  03/22/2024  . Colon Cancer Screening  05/28/2027  . DEXA scan (bone density measurement)  Completed  .  Hepatitis C: One time screening is recommended by Center for Disease Control  (CDC) for  adults born from [birth year] through 1965.   Completed  . Pneumonia vaccines  Completed

## 2018-08-24 ENCOUNTER — Encounter: Payer: Self-pay | Admitting: Internal Medicine

## 2018-08-24 NOTE — Assessment & Plan Note (Signed)
Doing better.  Son got a job.  Handling things better.  Sleeping better.  Follow.

## 2018-08-24 NOTE — Assessment & Plan Note (Signed)
Persistent.  Check stool studies to confirm no infectious etiology.  Probiotic daily.

## 2018-08-24 NOTE — Assessment & Plan Note (Signed)
On crestor.  Low cholesterol diet and exercise.  Follow lipid panel and liver function tests.   

## 2018-08-24 NOTE — Assessment & Plan Note (Signed)
Doing well on atenolol.

## 2018-08-24 NOTE — Assessment & Plan Note (Signed)
Follow liver function tests.   

## 2018-08-27 ENCOUNTER — Other Ambulatory Visit: Payer: Medicare Other

## 2018-08-27 ENCOUNTER — Other Ambulatory Visit: Payer: Self-pay

## 2018-08-27 DIAGNOSIS — R197 Diarrhea, unspecified: Secondary | ICD-10-CM

## 2018-08-28 ENCOUNTER — Encounter: Payer: Self-pay | Admitting: Internal Medicine

## 2018-08-28 ENCOUNTER — Other Ambulatory Visit: Payer: Self-pay

## 2018-08-28 ENCOUNTER — Other Ambulatory Visit (INDEPENDENT_AMBULATORY_CARE_PROVIDER_SITE_OTHER): Payer: Medicare Other

## 2018-08-28 DIAGNOSIS — R748 Abnormal levels of other serum enzymes: Secondary | ICD-10-CM

## 2018-08-28 DIAGNOSIS — I471 Supraventricular tachycardia, unspecified: Secondary | ICD-10-CM

## 2018-08-28 DIAGNOSIS — R197 Diarrhea, unspecified: Secondary | ICD-10-CM | POA: Diagnosis not present

## 2018-08-28 DIAGNOSIS — E7800 Pure hypercholesterolemia, unspecified: Secondary | ICD-10-CM

## 2018-08-28 LAB — BASIC METABOLIC PANEL
BUN: 18 mg/dL (ref 6–23)
CO2: 29 mEq/L (ref 19–32)
Calcium: 9.4 mg/dL (ref 8.4–10.5)
Chloride: 100 mEq/L (ref 96–112)
Creatinine, Ser: 0.97 mg/dL (ref 0.40–1.20)
GFR: 56.15 mL/min — ABNORMAL LOW (ref >60.00)
Glucose, Bld: 92 mg/dL (ref 70–99)
Potassium: 3.9 mEq/L (ref 3.5–5.1)
Sodium: 138 mEq/L (ref 135–145)

## 2018-08-28 LAB — CBC WITH DIFFERENTIAL/PLATELET
Basophils Absolute: 0.1 10*3/uL (ref 0.0–0.1)
Basophils Relative: 1.2 % (ref 0.0–3.0)
Eosinophils Absolute: 0.5 10*3/uL (ref 0.0–0.7)
Eosinophils Relative: 6.7 % — ABNORMAL HIGH (ref 0.0–5.0)
HCT: 40 % (ref 36.0–46.0)
Hemoglobin: 13.8 g/dL (ref 12.0–15.0)
Lymphocytes Relative: 23.9 % (ref 12.0–46.0)
Lymphs Abs: 1.8 10*3/uL (ref 0.7–4.0)
MCHC: 34.6 g/dL (ref 30.0–36.0)
MCV: 93.5 fl (ref 78.0–100.0)
Monocytes Absolute: 0.8 10*3/uL (ref 0.1–1.0)
Monocytes Relative: 10.3 % (ref 3.0–12.0)
Neutro Abs: 4.2 10*3/uL (ref 1.4–7.7)
Neutrophils Relative %: 57.9 % (ref 43.0–77.0)
Platelets: 256 10*3/uL (ref 150.0–400.0)
RBC: 4.27 Mil/uL (ref 3.87–5.11)
RDW: 13.4 % (ref 11.5–15.5)
WBC: 7.3 10*3/uL (ref 4.0–10.5)

## 2018-08-28 LAB — HEPATIC FUNCTION PANEL
ALT: 21 U/L (ref 0–35)
AST: 19 U/L (ref 0–37)
Albumin: 4.6 g/dL (ref 3.5–5.2)
Alkaline Phosphatase: 71 U/L (ref 39–117)
Bilirubin, Direct: 0.1 mg/dL (ref 0.0–0.3)
Total Bilirubin: 0.8 mg/dL (ref 0.2–1.2)
Total Protein: 7.4 g/dL (ref 6.0–8.3)

## 2018-08-28 LAB — LIPID PANEL
Cholesterol: 182 mg/dL (ref 0–200)
HDL: 52.1 mg/dL (ref >39.00)
LDL Cholesterol: 90 mg/dL (ref 0–99)
NonHDL: 129.49
Total CHOL/HDL Ratio: 3
Triglycerides: 198 mg/dL — ABNORMAL HIGH (ref 0.0–149.0)
VLDL: 39.6 mg/dL (ref 0.0–40.0)

## 2018-08-28 LAB — TSH: TSH: 1.6 u[IU]/mL (ref 0.35–4.50)

## 2018-08-29 ENCOUNTER — Telehealth: Payer: Self-pay | Admitting: *Deleted

## 2018-08-29 NOTE — Telephone Encounter (Signed)
Copied from [place] [phone]. Topic: General - Inquiry >> Aug 29, 2018 11:35 AM Mathis Bud wrote: Reason for CRM: Patient is calling to see if she needs to change physical to august.  Patient said she had an mychart message stating from PCP she needs to make an appt for august,  Patient already has appt for 7/23.   [phone]

## 2018-08-29 NOTE — Telephone Encounter (Signed)
Pt is going to keep regular appt scheduled

## 2018-09-02 LAB — GASTROINTESTINAL PATHOGEN PANEL PCR
C. difficile Tox A/B, PCR: NOT DETECTED
Campylobacter, PCR: NOT DETECTED
Cryptosporidium, PCR: NOT DETECTED
E coli (ETEC) LT/ST PCR: NOT DETECTED
E coli (STEC) stx1/stx2, PCR: NOT DETECTED
E coli 0157, PCR: NOT DETECTED
Giardia lamblia, PCR: DETECTED — AB
Norovirus, PCR: NOT DETECTED
Rotavirus A, PCR: NOT DETECTED
Salmonella, PCR: NOT DETECTED
Shigella, PCR: NOT DETECTED

## 2018-09-03 ENCOUNTER — Telehealth: Payer: Self-pay | Admitting: *Deleted

## 2018-09-03 ENCOUNTER — Other Ambulatory Visit: Payer: Self-pay | Admitting: Internal Medicine

## 2018-09-03 DIAGNOSIS — I471 Supraventricular tachycardia: Secondary | ICD-10-CM

## 2018-09-03 DIAGNOSIS — E7800 Pure hypercholesterolemia, unspecified: Secondary | ICD-10-CM

## 2018-09-03 MED ORDER — METRONIDAZOLE 500 MG PO TABS: 500.0000 mg | ORAL_TABLET | Freq: Two times a day (BID) | ORAL | 0 refills | Status: DC

## 2018-09-03 NOTE — Telephone Encounter (Signed)
-----   Message from Einar Pheasant, MD sent at 09/03/2018  1:55 PM EDT ----- Attempted to get in touch with pt.  Had to leave a message.  Please notify her that her stool study was positive for giardia.  Confirm no abx allergies.  If no, then need to send in Flagyl 500mg  bid x 1 week.  Let me know if any questions or problems.

## 2018-09-03 NOTE — Telephone Encounter (Signed)
Copied from [place] [phone]. Topic: General - Other >> Sep 03, 2018  2:20 PM Carolyn Stare wrote:   Pt said she returning  Dr Nicki Reaper call

## 2018-09-03 NOTE — Telephone Encounter (Signed)
Medication sent to pharmacy  

## 2018-10-15 ENCOUNTER — Telehealth: Payer: Self-pay | Admitting: Internal Medicine

## 2018-10-15 DIAGNOSIS — R748 Abnormal levels of other serum enzymes: Secondary | ICD-10-CM

## 2018-10-15 DIAGNOSIS — R739 Hyperglycemia, unspecified: Secondary | ICD-10-CM

## 2018-10-15 DIAGNOSIS — E7800 Pure hypercholesterolemia, unspecified: Secondary | ICD-10-CM

## 2018-10-15 DIAGNOSIS — I471 Supraventricular tachycardia: Secondary | ICD-10-CM

## 2018-10-15 NOTE — Telephone Encounter (Signed)
Pt would like to have fasting labs done before the Physical on 01/12/2019. Order needed please and thank you!  Call pt @ [phone]

## 2018-10-23 ENCOUNTER — Ambulatory Visit: Payer: Medicare Other | Admitting: Internal Medicine

## 2018-10-24 NOTE — Telephone Encounter (Signed)
Noted  

## 2018-10-24 NOTE — Telephone Encounter (Addendum)
I have ordered fasting labs for her. Wasn't sure if anything else needed to be added. I have already scheduled fasting labs a couple days prior to her October appt

## 2018-10-24 NOTE — Addendum Note (Signed)
Addended by: Lars Masson on: 10/24/2018 09:13 AM   Modules accepted: Orders

## 2018-11-04 ENCOUNTER — Telehealth: Payer: Self-pay

## 2018-11-04 ENCOUNTER — Other Ambulatory Visit: Payer: Self-pay | Admitting: Internal Medicine

## 2018-11-04 DIAGNOSIS — Z853 Personal history of malignant neoplasm of breast: Secondary | ICD-10-CM

## 2018-11-04 NOTE — Telephone Encounter (Signed)
Copied from [place] [phone]. Topic: Referral - Request for Referral >> Nov 04, 2018 10:50 AM Alanda Slim E wrote: Has patient seen PCP for this complaint? Yes  *If NO, is insurance requiring patient see PCP for this issue before PCP can refer them? Referral for which specialty: mammogram  Preferred provider/office: [facility]  Reason for referral: Bi lateral Mammogram/ Pt is due for mammogram  this month/ Pt is a breast cancer survivor

## 2018-11-05 NOTE — Telephone Encounter (Signed)
Orders placed. Pt aware and would like to schedule her own. Number given

## 2018-11-27 ENCOUNTER — Telehealth: Payer: Self-pay

## 2018-11-27 ENCOUNTER — Other Ambulatory Visit: Payer: Self-pay

## 2018-11-27 DIAGNOSIS — E2839 Other primary ovarian failure: Secondary | ICD-10-CM

## 2018-11-27 NOTE — Telephone Encounter (Signed)
Copied from [place] [id number]. Topic: General - Other >> Nov 27, 2018 10:49 AM Leward Quan A wrote: Reason for CRM: Patient called to say that she need to have an appointment for a bone density before her appointment in October she was hoping that she could have the bone density at the same time as her Mammogram on 12/04/2018. Patient can be reached at Ph# [phone]

## 2018-11-27 NOTE — Telephone Encounter (Signed)
Left message for patient letting pt know that I have ordered her bone density but was not able to schedule for her. Gave number for her to call [place] and schedule

## 2018-11-27 NOTE — Progress Notes (Signed)
a 

## 2018-12-04 ENCOUNTER — Ambulatory Visit
Admission: RE | Admit: 2018-12-04 | Discharge: 2018-12-04 | Disposition: A | Discharge status: Home / Self Care | Payer: Medicare Other | Source: Ambulatory Visit | Attending: Internal Medicine | Admitting: Internal Medicine

## 2018-12-04 DIAGNOSIS — Z853 Personal history of malignant neoplasm of breast: Secondary | ICD-10-CM | POA: Insufficient documentation

## 2018-12-10 ENCOUNTER — Ambulatory Visit
Admission: RE | Admit: 2018-12-10 | Discharge: 2018-12-10 | Disposition: A | Discharge status: Home / Self Care | Payer: Medicare Other | Source: Ambulatory Visit | Attending: Internal Medicine | Admitting: Internal Medicine

## 2018-12-10 DIAGNOSIS — E2839 Other primary ovarian failure: Secondary | ICD-10-CM | POA: Diagnosis present

## 2019-01-08 ENCOUNTER — Other Ambulatory Visit: Payer: Self-pay

## 2019-01-09 ENCOUNTER — Other Ambulatory Visit: Payer: Self-pay

## 2019-01-09 ENCOUNTER — Other Ambulatory Visit (INDEPENDENT_AMBULATORY_CARE_PROVIDER_SITE_OTHER): Payer: Medicare Other

## 2019-01-09 DIAGNOSIS — R739 Hyperglycemia, unspecified: Secondary | ICD-10-CM | POA: Diagnosis not present

## 2019-01-09 DIAGNOSIS — E7800 Pure hypercholesterolemia, unspecified: Secondary | ICD-10-CM

## 2019-01-09 DIAGNOSIS — R748 Abnormal levels of other serum enzymes: Secondary | ICD-10-CM | POA: Diagnosis not present

## 2019-01-09 DIAGNOSIS — I471 Supraventricular tachycardia: Secondary | ICD-10-CM | POA: Diagnosis not present

## 2019-01-09 LAB — LIPID PANEL
Cholesterol: 143 mg/dL (ref 0–200)
HDL: 45 mg/dL (ref >39.00)
LDL Cholesterol: 62 mg/dL (ref 0–99)
NonHDL: 97.9
Total CHOL/HDL Ratio: 3
Triglycerides: 179 mg/dL — ABNORMAL HIGH (ref 0.0–149.0)
VLDL: 35.8 mg/dL (ref 0.0–40.0)

## 2019-01-09 LAB — BASIC METABOLIC PANEL
BUN: 13 mg/dL (ref 6–23)
CO2: 28 mEq/L (ref 19–32)
Calcium: 9.3 mg/dL (ref 8.4–10.5)
Chloride: 102 mEq/L (ref 96–112)
Creatinine, Ser: 0.89 mg/dL (ref 0.40–1.20)
GFR: 61.95 mL/min (ref >60.00)
Glucose, Bld: 98 mg/dL (ref 70–99)
Potassium: 3.9 mEq/L (ref 3.5–5.1)
Sodium: 138 mEq/L (ref 135–145)

## 2019-01-09 LAB — HEPATIC FUNCTION PANEL
ALT: 23 U/L (ref 0–35)
AST: 19 U/L (ref 0–37)
Albumin: 4.7 g/dL (ref 3.5–5.2)
Alkaline Phosphatase: 66 U/L (ref 39–117)
Bilirubin, Direct: 0.2 mg/dL (ref 0.0–0.3)
Total Bilirubin: 0.8 mg/dL (ref 0.2–1.2)
Total Protein: 7.2 g/dL (ref 6.0–8.3)

## 2019-01-09 LAB — HEMOGLOBIN A1C: Hgb A1c MFr Bld: 5.3 % (ref 4.6–6.5)

## 2019-01-12 ENCOUNTER — Encounter: Payer: Self-pay | Admitting: Internal Medicine

## 2019-01-12 ENCOUNTER — Ambulatory Visit: Payer: Medicare Other | Admitting: Internal Medicine

## 2019-01-12 ENCOUNTER — Other Ambulatory Visit: Payer: Self-pay

## 2019-01-12 VITALS — BP 112/60 | HR 62 | Temp 96.5°F | Resp 16 | Ht 66.0 in | Wt 152.0 lb

## 2019-01-12 DIAGNOSIS — Z0000 Encounter for general adult medical examination without abnormal findings: Secondary | ICD-10-CM

## 2019-01-12 DIAGNOSIS — D0511 Intraductal carcinoma in situ of right breast: Secondary | ICD-10-CM

## 2019-01-12 DIAGNOSIS — M8580 Other specified disorders of bone density and structure, unspecified site: Secondary | ICD-10-CM

## 2019-01-12 DIAGNOSIS — F419 Anxiety disorder, unspecified: Secondary | ICD-10-CM

## 2019-01-12 DIAGNOSIS — E7800 Pure hypercholesterolemia, unspecified: Secondary | ICD-10-CM

## 2019-01-12 DIAGNOSIS — M810 Age-related osteoporosis without current pathological fracture: Secondary | ICD-10-CM

## 2019-01-12 DIAGNOSIS — Z8742 Personal history of other diseases of the female genital tract: Secondary | ICD-10-CM

## 2019-01-12 DIAGNOSIS — I471 Supraventricular tachycardia: Secondary | ICD-10-CM

## 2019-01-12 LAB — URINALYSIS, ROUTINE W REFLEX MICROSCOPIC
Bilirubin Urine: NEGATIVE
Hgb urine dipstick: NEGATIVE
Ketones, ur: NEGATIVE
Leukocytes,Ua: NEGATIVE
Nitrite: NEGATIVE
RBC / HPF: NONE SEEN (ref 0–?)
Specific Gravity, Urine: 1.025 (ref 1.000–1.030)
Total Protein, Urine: NEGATIVE
Urine Glucose: NEGATIVE
Urobilinogen, UA: 0.2 (ref 0.0–1.0)
pH: 5.5 (ref 5.0–8.0)

## 2019-01-12 LAB — VITAMIN D 25 HYDROXY (VIT D DEFICIENCY, FRACTURES): VITD: 26.6 ng/mL — ABNORMAL LOW (ref 30.00–100.00)

## 2019-01-12 MED ORDER — ALPRAZOLAM 0.25 MG PO TABS: 0.2500 mg | ORAL_TABLET | Freq: Every day | ORAL | 0 refills | Status: DC | PRN

## 2019-01-12 NOTE — Assessment & Plan Note (Signed)
Physical today 01/12/19.  Mammogram 12/04/18 - Birads II.  Recommended diagnostic mammogram in one year.  Colonoscopy 05/27/17 - entire colon normal.

## 2019-01-12 NOTE — Progress Notes (Signed)
Patient ID: Claudia Little, female   DOB: 10/21/1945, 73 y.o.   MRN: [id number]   Subjective:    Patient ID: Claudia Little, female    DOB: 28-Mar-1946, 73 y.o.   MRN: [id number]  HPI  Patient here for a physical exam.  She reports she is doing relatively well.  Some increased stress with a family medical issue.  Overall she feels she is handling things relatively well.  Takes an occasional xanax.  Request to have refilled - just to have if needed.  Tries to stay active.  No chest pain. No increased heart rate or palpitations.  Doing well on atenolol.  No sob. No acid reflux.  No abdominal pain.  Bowels moving.  On crestor.  Discussed labs.  Discussed bone density.  Discussed treatment options.  Agreeable to reclast.     Past Medical History:  Diagnosis Date  . Breast cancer ([place]) 12/2013   right breast cancer  . Chronic anxiety   . Dysrhythmia    SVT  . History of kidney stones   . Insomnia   . Nephrolithiasis   . Osteoporosis   . PAC (premature atrial contraction)   . Palpitations   . Personal history of radiation therapy 2015   Right breast  . SVT (supraventricular tachycardia) (HCC)    Past Surgical History:  Procedure Laterality Date  . BREAST CYST ASPIRATION Left 10+ yrs ago   neg  . BREAST EXCISIONAL BIOPSY Right 12/15/2013   DCIS  . BREAST SURGERY Right 12/15/2013   lumpectomy  . BUNIONECTOMY  early '70s  . COLONOSCOPY WITH PROPOFOL N/A 05/27/2017   Procedure: COLONOSCOPY WITH PROPOFOL;  Surgeon: Lollie Sails, MD;  Location: [facility] ENDOSCOPY;  Service: Endoscopy;  Laterality: N/A;  . DILATION AND CURETTAGE OF UTERUS  1973   after miscarriage  . LITHOTRIPSY     Family History  Problem Relation Age of Onset  . Aortic stenosis Mother   . Congestive Heart Failure Mother   . Aortic stenosis Father   . Coronary artery disease Father        Deceased from complications of atherosclerotic CAD and Atherosclerosis  . Congestive Heart Failure Father   . Parkinsonism Brother    . Colon polyps Sister   . Arthritis Maternal Grandfather   . Early death Paternal Grandmother   . Heart block Brother   . Stroke Maternal Grandmother   . Kidney disease Paternal Grandfather   . Breast cancer Maternal Aunt 70  . Ovarian cancer Maternal Aunt    Social History   Socioeconomic History  . Marital status: Married    Spouse name: Richard  . Number of children: 1  . Years of education: HS  . Highest education level: Not on file  Occupational History  . Occupation: Retired  Scientific laboratory technician  . Financial resource strain: Not hard at all  . Food insecurity    Worry: Never true    Inability: Never true  . Transportation needs    Medical: No    Non-medical: No  Tobacco Use  . Smoking status: Never Smoker  . Smokeless tobacco: Former Network engineer and Sexual Activity  . Alcohol use: Yes    Alcohol/week: 7.0 standard drinks    Types: 7 Glasses of wine per week  . Drug use: No  . Sexual activity: Not Currently  Lifestyle  . Physical activity    Days per week: 3 days    Minutes per session: 60 min  . Stress: Not at  all  Relationships  . Social Herbalist on phone: Not on file    Gets together: Not on file    Attends religious service: Not on file    Active member of club or organization: Not on file    Attends meetings of clubs or organizations: Not on file    Relationship status: Not on file  Other Topics Concern  . Not on file  Social History Narrative  . Not on file    Outpatient Encounter Medications as of 01/12/2019  Medication Sig  . ALPRAZolam (XANAX) 0.25 MG tablet Take 1 tablet (0.25 mg total) by mouth daily as needed for anxiety.  Marland Kitchen atenolol (TENORMIN) 25 MG tablet TAKE ONE TABLET EVERY DAY  . Cholecalciferol 1000 units capsule Take 1 capsule (1,000 Units total) by mouth daily.  . Coenzyme Q10 100 MG TABS Take 1 tablet (100 mg total) by mouth daily.  . Probiotic Product (PROBIOTIC DAILY PO) Take 1 capsule by mouth daily.  .  rosuvastatin (CRESTOR) 5 MG tablet TAKE 1 TABLET BY MOUTH DAILY  . [DISCONTINUED] ALPRAZolam (XANAX) 0.25 MG tablet Take 1 tablet (0.25 mg total) by mouth daily as needed for anxiety.  . [DISCONTINUED] metroNIDAZOLE (FLAGYL) 500 MG tablet Take 1 tablet (500 mg total) by mouth 2 (two) times daily.   No facility-administered encounter medications on file as of 01/12/2019.     Review of Systems  Constitutional: Negative for appetite change and unexpected weight change.  HENT: Negative for congestion and sinus pressure.   Eyes: Negative for pain and visual disturbance.  Respiratory: Negative for cough, chest tightness and shortness of breath.   Cardiovascular: Negative for chest pain, palpitations and leg swelling.  Gastrointestinal: Negative for abdominal pain, diarrhea, nausea and vomiting.  Genitourinary: Negative for difficulty urinating and dysuria.  Musculoskeletal: Negative for joint swelling and myalgias.  Skin: Negative for color change and rash.  Neurological: Negative for dizziness, light-headedness and headaches.  Hematological: Negative for adenopathy. Does not bruise/bleed easily.  Psychiatric/Behavioral: Negative for agitation and dysphoric mood.       Objective:    Physical Exam Constitutional:      General: She is not in acute distress.    Appearance: Normal appearance. She is well-developed.  HENT:     Head: Normocephalic and atraumatic.     Right Ear: External ear normal.     Left Ear: External ear normal.  Eyes:     General: No scleral icterus.       Right eye: No discharge.        Left eye: No discharge.     Conjunctiva/sclera: Conjunctivae normal.  Neck:     Musculoskeletal: Neck supple. No muscular tenderness.     Thyroid: No thyromegaly.  Cardiovascular:     Rate and Rhythm: Normal rate and regular rhythm.  Pulmonary:     Effort: No tachypnea, accessory muscle usage or respiratory distress.     Breath sounds: Normal breath sounds. No decreased breath  sounds or wheezing.  Chest:     Breasts:        Right: No inverted nipple, mass, nipple discharge or tenderness (no axillary adenopathy).        Left: No inverted nipple, mass, nipple discharge or tenderness (no axilarry adenopathy).  Abdominal:     General: Bowel sounds are normal.     Palpations: Abdomen is soft.     Tenderness: There is no abdominal tenderness.  Musculoskeletal:        General:  No swelling or tenderness.  Lymphadenopathy:     Cervical: No cervical adenopathy.  Skin:    Findings: No erythema or rash.  Neurological:     Mental Status: She is alert and oriented to person, place, and time.  Psychiatric:        Mood and Affect: Mood normal.        Behavior: Behavior normal.     BP 112/60   Pulse 62   Temp (!) 96.5 F (35.8 C)   Resp 16   Ht 5\' 6"  (1.676 m)   Wt 152 lb (68.9 kg)   SpO2 99%   BMI 24.53 kg/m  Wt Readings from Last 3 Encounters:  01/12/19 152 lb (68.9 kg)  05/01/18 152 lb 8.9 oz (69.2 kg)  01/22/18 150 lb 6.4 oz (68.2 kg)     Lab Results  Component Value Date   WBC 7.3 08/28/2018   HGB 13.8 08/28/2018   HCT 40.0 08/28/2018   PLT 256.0 08/28/2018   GLUCOSE 98 01/09/2019   CHOL 143 01/09/2019   TRIG 179.0 (H) 01/09/2019   HDL 45.00 01/09/2019   LDLDIRECT 104.0 01/21/2018   LDLCALC 62 01/09/2019   ALT 23 01/09/2019   AST 19 01/09/2019   NA 138 01/09/2019   K 3.9 01/09/2019   CL 102 01/09/2019   CREATININE 0.89 01/09/2019   BUN 13 01/09/2019   CO2 28 01/09/2019   TSH 1.60 08/28/2018   HGBA1C 5.3 01/09/2019    Dg Bone Density  Result Date: 12/10/2018 EXAM: DUAL X-RAY ABSORPTIOMETRY (DXA) FOR BONE MINERAL DENSITY IMPRESSION: Technologist: MTB Your patient Yuan Piela completed a BMD test on 12/10/2018 using the [place] (analysis version: 14.10) manufactured by [employer]. The following summarizes the results of our evaluation. PATIENT BIOGRAPHICAL: Name: Alleyne, Rudge Patient ID: [id number] Birth Date: [birth year]  Height: 66.0 in. Gender: Female Exam Date: 12/10/2018 Weight: 152.6 lbs. Indications: Advanced Age, Breast CA, Caucasian, Family Hx of Osteoporosis, Height Loss, Osteopenia, Postmenopausal Fractures: Treatments: Vitamin D ASSESSMENT: The BMD measured at Forearm Radius 33% is 0.647 g/cm2 with a T-score of -2.6. This patient is considered osteoporotic according to [facility]) criteria. The scan quality is good. L-3 and L-4 were excluded due to degenerative changes. Site Region Measured Measured WHO Young Adult BMD Date       Age      Classification T-score AP Spine L1-L2 12/10/2018 74.2 Osteopenia -2.0 0.931 g/cm2 AP Spine L1-L2 07/31/2016 71.8 Osteopenia -2.1 0.914 g/cm2 AP Spine L1-L2 05/04/2014 69.6 Osteopenia -2.0 0.936 g/cm2 AP Spine L1-L2 08/22/2011 66.9 Osteopenia -2.0 0.935 g/cm2 DualFemur Neck Left 12/10/2018 74.2 Osteopenia -1.6 0.817 g/cm2 DualFemur Neck Left 07/31/2016 71.8 Osteopenia -1.8 0.789 g/cm2 DualFemur Neck Left 05/04/2014 69.6 Osteopenia -1.6 0.815 g/cm2 DualFemur Neck Left 08/22/2011 66.9 Osteopenia -1.5 0.825 g/cm2 DualFemur Total Mean 12/10/2018 74.2 Osteopenia -1.2 0.850 g/cm2 DualFemur Total Mean 07/31/2016 71.8 Osteopenia -1.3 0.842 g/cm2 DualFemur Total Mean 05/04/2014 69.6 Osteopenia -1.2 0.852 g/cm2 DualFemur Total Mean 08/22/2011 66.9 Osteopenia -1.3 0.846 g/cm2 Left Forearm Radius 33% 12/10/2018 74.2 Osteoporosis -2.6 0.647 g/cm2 Left Forearm Radius 33% 05/04/2014 69.6 Osteoporosis -2.5 0.654 g/cm2 Left Forearm Radius 33% 08/22/2011 66.9 Osteopenia -1.5 0.744 g/cm2 World Health Organization [facility]) criteria for post-menopausal, Caucasian Women: Normal:       T-score at or above -1 SD Osteopenia:   T-score between -1 and -2.5 SD Osteoporosis: T-score at or below -2.5 SD RECOMMENDATIONS: 1. All patients should optimize calcium and vitamin D intake. 2. Consider  FDA-approved medical therapies in postmenopausal women and men aged 23 years and older, based on the following:  a. A hip or vertebral(clinical or morphometric) fracture b. T-score < -2.5 at the femoral neck or spine after appropriate evaluation to exclude secondary causes c. Low bone mass (T-score between -1.0 and -2.5 at the femoral neck or spine) and a 10-year probability of a hip fracture > 3% or a 10-year probability of a major osteoporosis-related fracture > 20% based on the [place]-adapted WHO algorithm d. Clinician judgment and/or patient preferences may indicate treatment for people with 10-year fracture probabilities above or below these levels FOLLOW-UP: People with diagnosed cases of osteoporosis or at high risk for fracture should have regular bone mineral density tests. For patients eligible for Medicare, routine testing is allowed once every 2 years. The testing frequency can be increased to one year for patients who have rapidly progressing disease, those who are receiving or discontinuing medical therapy to restore bone mass, or have additional risk factors. I have reviewed this report, and agree with the above findings. [facility] Radiology Electronically Signed   By: Lowella Grip III M.D.   On: 12/10/2018 13:55       Assessment & Plan:   Problem List Items Addressed This Visit    Anxiety    Overall doing better.  Some increased stress recently with a family medical issue.  Takes xanax prn.  Request refill.  Follow.        Relevant Medications   ALPRAZolam (XANAX) 0.25 MG tablet   Ductal carcinoma in situ (DCIS) of right breast    Right breast DCIS.  S/p lumpectomy.  S/p XRT.  Is ER/PR positive.  Mammogram 12/04/18 - Birads II.  Follow.        Relevant Medications   ALPRAZolam (XANAX) 0.25 MG tablet   Healthcare maintenance    Physical today 01/12/19.  Mammogram 12/04/18 - Birads II.  Recommended diagnostic mammogram in one year.  Colonoscopy 05/27/17 - entire colon normal.        History of abnormal cervical Pap smear    PAP 06/15/16 - negative with negative HPV.         Hypercholesteremia    On crestor.  Low cholesterol diet and exercise.  Follow lipid panel and liver function tests.        Relevant Orders   Hepatic function panel   Lipid panel   Basic metabolic panel   osteoporosis - Primary    Discussed her bone density results.  Discussed treatment options.  She desires reclast.  Check urine and vitamin D today. Form completion to be done once lab results return.        Supraventricular tachycardia ([place])    Doing well on atenolol.         Other Visit Diagnoses    Osteoporosis without current pathological fracture, unspecified osteoporosis type       Relevant Orders   Urinalysis, Routine w reflex microscopic (Completed)   Vitamin D (25 hydroxy) (Completed)       Einar Pheasant, MD

## 2019-01-18 ENCOUNTER — Encounter: Payer: Self-pay | Admitting: Internal Medicine

## 2019-01-18 NOTE — Assessment & Plan Note (Signed)
Right breast DCIS.  S/p lumpectomy.  S/p XRT.  Is ER/PR positive.  Mammogram 12/04/18 - Birads II.  Follow.

## 2019-01-18 NOTE — Assessment & Plan Note (Signed)
Doing well on atenolol.   

## 2019-01-18 NOTE — Assessment & Plan Note (Signed)
PAP 06/15/16 - negative with negative HPV.

## 2019-01-18 NOTE — Assessment & Plan Note (Signed)
Overall doing better.  Some increased stress recently with a family medical issue.  Takes xanax prn.  Request refill.  Follow.

## 2019-01-18 NOTE — Assessment & Plan Note (Signed)
On crestor.  Low cholesterol diet and exercise.  Follow lipid panel and liver function tests.   

## 2019-01-18 NOTE — Assessment & Plan Note (Signed)
Discussed her bone density results.  Discussed treatment options.  She desires reclast.  Check urine and vitamin D today. Form completion to be done once lab results return.

## 2019-01-20 ENCOUNTER — Other Ambulatory Visit: Payer: Self-pay | Admitting: Internal Medicine

## 2019-01-20 DIAGNOSIS — E7800 Pure hypercholesterolemia, unspecified: Secondary | ICD-10-CM

## 2019-01-26 ENCOUNTER — Encounter: Payer: Self-pay | Admitting: Internal Medicine

## 2019-01-27 ENCOUNTER — Other Ambulatory Visit: Payer: Self-pay | Admitting: Internal Medicine

## 2019-01-27 DIAGNOSIS — M810 Age-related osteoporosis without current pathological fracture: Secondary | ICD-10-CM

## 2019-01-27 NOTE — Progress Notes (Signed)
Order placed for endocrinology referral for reclast.

## 2019-03-30 ENCOUNTER — Ambulatory Visit: Payer: Medicare Other | Attending: Internal Medicine

## 2019-03-30 DIAGNOSIS — Z20822 Contact with and (suspected) exposure to covid-19: Secondary | ICD-10-CM

## 2019-04-01 LAB — NOVEL CORONAVIRUS, NAA: SARS-CoV-2, NAA: NOT DETECTED

## 2019-05-21 ENCOUNTER — Ambulatory Visit: Payer: Medicare PPO | Admitting: Radiation Oncology

## 2019-06-04 ENCOUNTER — Ambulatory Visit
Admission: RE | Admit: 2019-06-04 | Discharge: 2019-06-04 | Disposition: A | Discharge status: Home / Self Care | Payer: Medicare PPO | Source: Ambulatory Visit | Attending: Radiation Oncology | Admitting: Radiation Oncology

## 2019-06-04 ENCOUNTER — Other Ambulatory Visit: Payer: Self-pay

## 2019-06-04 ENCOUNTER — Encounter: Payer: Self-pay | Admitting: Radiation Oncology

## 2019-06-04 DIAGNOSIS — Z86000 Personal history of in-situ neoplasm of breast: Secondary | ICD-10-CM | POA: Insufficient documentation

## 2019-06-04 DIAGNOSIS — Z923 Personal history of irradiation: Secondary | ICD-10-CM | POA: Insufficient documentation

## 2019-06-04 DIAGNOSIS — D0511 Intraductal carcinoma in situ of right breast: Secondary | ICD-10-CM

## 2019-06-04 NOTE — Progress Notes (Signed)
Radiation Oncology Follow up Note  Name: Claudia Little   Date:   06/04/2019 MRN:  [id number] DOB: December 12, 1945    This 74 y.o. female presents to the clinic today for 4-1/2-year follow-up status post whole breast radiation to right breast for ER/PR negative ductal carcinoma site to  REFERRING PROVIDER: Einar Pheasant, MD  HPI: Patient is a 74 year old female now out close to 5 years having completed whole breast radiation to right breast for ER/PR negative ductal carcinoma in situ.  Seen today in routine follow-up she is doing well.  She specifically denies breast tenderness cough or bone pain.  Based on the ER/PR negative nature of her tumor she is not on antiestrogen therapy..  Her last mammogram was shot which I have reviewed were back in September and BI-RADS 2 benign.  COMPLICATIONS OF TREATMENT: none  FOLLOW UP COMPLIANCE: keeps appointments   PHYSICAL EXAM:  There were no vitals taken for this visit. Lungs are clear to A&P cardiac examination essentially unremarkable with regular rate and rhythm. No dominant mass or nodularity is noted in either breast in 2 positions examined. Incision is well-healed. No axillary or supraclavicular adenopathy is appreciated. Cosmetic result is excellent.  Well-developed well-nourished patient in NAD. HEENT reveals PERLA, EOMI, discs not visualized.  Oral cavity is clear. No oral mucosal lesions are identified. Neck is clear without evidence of cervical or supraclavicular adenopathy. Lungs are clear to A&P. Cardiac examination is essentially unremarkable with regular rate and rhythm without murmur rub or thrill. Abdomen is benign with no organomegaly or masses noted. Motor sensory and DTR levels are equal and symmetric in the upper and lower extremities. Cranial nerves II through XII are grossly intact. Proprioception is intact. No peripheral adenopathy or edema is identified. No motor or sensory levels are noted. Crude visual fields are within normal  range.  RADIOLOGY RESULTS: Mammograms reviewed compatible with above-stated findings  PLAN: Present time she continues to do well close to 5 years out with no evidence of disease.  I am pleased with her overall progress at this time I am going to discontinue follow-up care she continues follow-up care with her PMD Dr. Renata Caprice.  Who will be ordering her mammograms.  Patient knows to call contact me with any problems or concerns in the future.  I would like to take this opportunity to thank you for allowing me to participate in the care of your patient.Noreene Filbert, MD

## 2019-06-05 ENCOUNTER — Other Ambulatory Visit: Payer: Self-pay | Admitting: Internal Medicine

## 2019-06-05 DIAGNOSIS — I471 Supraventricular tachycardia: Secondary | ICD-10-CM

## 2019-07-07 ENCOUNTER — Other Ambulatory Visit: Payer: Self-pay

## 2019-07-07 ENCOUNTER — Other Ambulatory Visit (INDEPENDENT_AMBULATORY_CARE_PROVIDER_SITE_OTHER): Payer: Medicare PPO

## 2019-07-07 DIAGNOSIS — E7800 Pure hypercholesterolemia, unspecified: Secondary | ICD-10-CM

## 2019-07-07 LAB — BASIC METABOLIC PANEL
BUN: 19 mg/dL (ref 6–23)
CO2: 27 mEq/L (ref 19–32)
Calcium: 9.3 mg/dL (ref 8.4–10.5)
Chloride: 104 mEq/L (ref 96–112)
Creatinine, Ser: 0.95 mg/dL (ref 0.40–1.20)
GFR: 57.38 mL/min — ABNORMAL LOW (ref >60.00)
Glucose, Bld: 105 mg/dL — ABNORMAL HIGH (ref 70–99)
Potassium: 4 mEq/L (ref 3.5–5.1)
Sodium: 139 mEq/L (ref 135–145)

## 2019-07-07 LAB — LIPID PANEL
Cholesterol: 167 mg/dL (ref 0–200)
HDL: 51 mg/dL (ref >39.00)
LDL Cholesterol: 84 mg/dL (ref 0–99)
NonHDL: 115.73
Total CHOL/HDL Ratio: 3
Triglycerides: 160 mg/dL — ABNORMAL HIGH (ref 0.0–149.0)
VLDL: 32 mg/dL (ref 0.0–40.0)

## 2019-07-07 LAB — HEPATIC FUNCTION PANEL
ALT: 20 U/L (ref 0–35)
AST: 18 U/L (ref 0–37)
Albumin: 4.5 g/dL (ref 3.5–5.2)
Alkaline Phosphatase: 60 U/L (ref 39–117)
Bilirubin, Direct: 0.1 mg/dL (ref 0.0–0.3)
Total Bilirubin: 0.6 mg/dL (ref 0.2–1.2)
Total Protein: 7.1 g/dL (ref 6.0–8.3)

## 2019-07-08 ENCOUNTER — Other Ambulatory Visit: Payer: Medicare Other

## 2019-07-13 ENCOUNTER — Ambulatory Visit: Payer: Medicare PPO | Admitting: Internal Medicine

## 2019-07-13 ENCOUNTER — Other Ambulatory Visit: Payer: Self-pay

## 2019-07-13 DIAGNOSIS — I471 Supraventricular tachycardia, unspecified: Secondary | ICD-10-CM

## 2019-07-13 DIAGNOSIS — D0511 Intraductal carcinoma in situ of right breast: Secondary | ICD-10-CM | POA: Diagnosis not present

## 2019-07-13 DIAGNOSIS — F419 Anxiety disorder, unspecified: Secondary | ICD-10-CM

## 2019-07-13 DIAGNOSIS — M8580 Other specified disorders of bone density and structure, unspecified site: Secondary | ICD-10-CM

## 2019-07-13 DIAGNOSIS — E7800 Pure hypercholesterolemia, unspecified: Secondary | ICD-10-CM

## 2019-07-13 DIAGNOSIS — Z860100 Personal history of colon polyps, unspecified: Secondary | ICD-10-CM

## 2019-07-13 NOTE — Progress Notes (Signed)
Patient ID: Claudia Little, female   DOB: Jan 17, 1946, 74 y.o.   MRN: [id number]   Subjective:    Patient ID: Claudia Little, female    DOB: 10/25/1945, 74 y.o.   MRN: [id number]  HPI This visit occurred during the SARS-CoV-2 public health emergency.  Safety protocols were in place, including screening questions prior to the visit, additional usage of staff PPE, and extensive cleaning of exam room while observing appropriate contact time as indicated for disinfecting solutions.  Patient here for a scheduled follow up.  She reports she is doing well. Stays active.  No chest pain or sob reported.  No abdominal pain.  Bowels moving.  Had referred her to endocrinology.  Had discussed reclast.  Evaluated and started on fosamax.  Had questions about fosamax.  Taking and tolerating.     Past Medical History:  Diagnosis Date  . Breast cancer ([place]) 12/2013   right breast cancer  . Chronic anxiety   . Dysrhythmia    SVT  . History of kidney stones   . Insomnia   . Nephrolithiasis   . Osteoporosis   . PAC (premature atrial contraction)   . Palpitations   . Personal history of radiation therapy 2015   Right breast  . SVT (supraventricular tachycardia) (HCC)    Past Surgical History:  Procedure Laterality Date  . BREAST CYST ASPIRATION Left 10+ yrs ago   neg  . BREAST EXCISIONAL BIOPSY Right 12/15/2013   DCIS  . BREAST SURGERY Right 12/15/2013   lumpectomy  . BUNIONECTOMY  early '70s  . COLONOSCOPY WITH PROPOFOL N/A 05/27/2017   Procedure: COLONOSCOPY WITH PROPOFOL;  Surgeon: Lollie Sails, MD;  Location: [facility] ENDOSCOPY;  Service: Endoscopy;  Laterality: N/A;  . DILATION AND CURETTAGE OF UTERUS  1973   after miscarriage  . LITHOTRIPSY     Family History  Problem Relation Age of Onset  . Aortic stenosis Mother   . Congestive Heart Failure Mother   . Aortic stenosis Father   . Coronary artery disease Father        Deceased from complications of atherosclerotic CAD and Atherosclerosis   . Congestive Heart Failure Father   . Parkinsonism Brother   . Colon polyps Sister   . Arthritis Maternal Grandfather   . Early death Paternal Grandmother   . Heart block Brother   . Stroke Maternal Grandmother   . Kidney disease Paternal Grandfather   . Breast cancer Maternal Aunt 70  . Ovarian cancer Maternal Aunt    Social History   Socioeconomic History  . Marital status: Married    Spouse name: Richard  . Number of children: 1  . Years of education: HS  . Highest education level: Not on file  Occupational History  . Occupation: Retired  Tobacco Use  . Smoking status: Never Smoker  . Smokeless tobacco: Former Network engineer and Sexual Activity  . Alcohol use: Yes    Alcohol/week: 7.0 standard drinks    Types: 7 Glasses of wine per week  . Drug use: No  . Sexual activity: Not Currently  Other Topics Concern  . Not on file  Social History Narrative  . Not on file   Social Determinants of Health   Financial Resource Strain:   . Difficulty of Paying Living Expenses:   Food Insecurity:   . Worried About Charity fundraiser in the Last Year:   . Arboriculturist in the Last Year:   Transportation Needs:   .  Lack of Transportation (Medical):   Marland Kitchen Lack of Transportation (Non-Medical):   Physical Activity:   . Days of Exercise per Week:   . Minutes of Exercise per Session:   Stress:   . Feeling of Stress :   Social Connections:   . Frequency of Communication with Friends and Family:   . Frequency of Social Gatherings with Friends and Family:   . Attends Religious Services:   . Active Member of Clubs or Organizations:   . Attends Archivist Meetings:   Marland Kitchen Marital Status:     Outpatient Encounter Medications as of 07/13/2019  Medication Sig  . alendronate (FOSAMAX) 70 MG tablet Take 1 tablet by mouth every 7 (seven) days.  Marland Kitchen atenolol (TENORMIN) 25 MG tablet TAKE ONE TABLET BY MOUTH EVERY DAY  . Cholecalciferol 1000 units capsule Take 1 capsule (1,000  Units total) by mouth daily.  . Coenzyme Q10 100 MG TABS Take 1 tablet (100 mg total) by mouth daily.  . Probiotic Product (PROBIOTIC DAILY PO) Take 1 capsule by mouth daily.  . rosuvastatin (CRESTOR) 5 MG tablet TAKE 1 TABLET BY MOUTH DAILY   No facility-administered encounter medications on file as of 07/13/2019.    Review of Systems  Constitutional: Negative for appetite change and unexpected weight change.  HENT: Negative for congestion and sinus pressure.   Eyes: Negative for pain and visual disturbance.  Respiratory: Negative for cough, chest tightness and shortness of breath.   Cardiovascular: Negative for chest pain, palpitations and leg swelling.  Gastrointestinal: Negative for abdominal pain, diarrhea, nausea and vomiting.  Genitourinary: Negative for difficulty urinating and dysuria.  Musculoskeletal: Negative for joint swelling and myalgias.  Skin: Negative for color change and rash.  Neurological: Negative for dizziness, light-headedness and headaches.  Hematological: Negative for adenopathy. Does not bruise/bleed easily.  Psychiatric/Behavioral: Negative for agitation and dysphoric mood.       Objective:    Physical Exam Vitals reviewed.  Constitutional:      General: She is not in acute distress.    Appearance: Normal appearance.  HENT:     Head: Normocephalic and atraumatic.     Right Ear: External ear normal.     Left Ear: External ear normal.  Eyes:     General:        Right eye: No discharge.        Left eye: No discharge.     Conjunctiva/sclera: Conjunctivae normal.  Neck:     Thyroid: No thyromegaly.  Cardiovascular:     Rate and Rhythm: Normal rate and regular rhythm.  Pulmonary:     Effort: No respiratory distress.     Breath sounds: Normal breath sounds. No wheezing.  Abdominal:     General: Bowel sounds are normal.     Palpations: Abdomen is soft.     Tenderness: There is no abdominal tenderness.  Musculoskeletal:        General: No swelling  or tenderness.     Cervical back: Neck supple. No tenderness.  Lymphadenopathy:     Cervical: No cervical adenopathy.  Skin:    Findings: No erythema or rash.  Neurological:     Mental Status: She is alert.  Psychiatric:        Mood and Affect: Mood normal.        Behavior: Behavior normal.     BP 114/70   Pulse 60   Temp (!) 97 F (36.1 C)   Resp 16   Wt 158 lb 12.8 oz (  72 kg)   SpO2 98%   BMI 25.63 kg/m  Wt Readings from Last 3 Encounters:  07/13/19 158 lb 12.8 oz (72 kg)  01/12/19 152 lb (68.9 kg)  05/01/18 152 lb 8.9 oz (69.2 kg)     Lab Results  Component Value Date   WBC 7.3 08/28/2018   HGB 13.8 08/28/2018   HCT 40.0 08/28/2018   PLT 256.0 08/28/2018   GLUCOSE 105 (H) 07/07/2019   CHOL 167 07/07/2019   TRIG 160.0 (H) 07/07/2019   HDL 51.00 07/07/2019   LDLDIRECT 104.0 01/21/2018   LDLCALC 84 07/07/2019   ALT 20 07/07/2019   AST 18 07/07/2019   NA 139 07/07/2019   K 4.0 07/07/2019   CL 104 07/07/2019   CREATININE 0.95 07/07/2019   BUN 19 07/07/2019   CO2 27 07/07/2019   TSH 1.60 08/28/2018   HGBA1C 5.3 01/09/2019       Assessment & Plan:   Problem List Items Addressed This Visit    Anxiety    Has xanax if needed.  Doing better.  Follow.        Ductal carcinoma in situ (DCIS) of right breast    Right breast DCIS.  S/p lumpectomy.  S/p XRT.  Is ER/PR positive.  Mammogram 12/04/18 - birads II.  Follow.        History of colon polyps    Colonoscopy 05/2017 - clear.  Follow.        Hypercholesteremia    On crestor.  Low cholesterol diet and exercise.  Follow lipid panel and liver function tests.        Relevant Orders   CBC with Differential/Platelet   Hepatic function panel   Lipid panel   TSH   Basic metabolic panel   osteoporosis    Saw endocrinology.  On fosamax.  Follow.        Supraventricular tachycardia ([place])    Doing well on atenolol.            Einar Pheasant, MD

## 2019-07-18 ENCOUNTER — Encounter: Payer: Self-pay | Admitting: Internal Medicine

## 2019-07-18 NOTE — Assessment & Plan Note (Signed)
Right breast DCIS.  S/p lumpectomy.  S/p XRT.  Is ER/PR positive.  Mammogram 12/04/18 - birads II.  Follow.

## 2019-07-18 NOTE — Assessment & Plan Note (Signed)
On crestor.  Low cholesterol diet and exercise.  Follow lipid panel and liver function tests.   

## 2019-07-18 NOTE — Assessment & Plan Note (Signed)
Has xanax if needed.  Doing better.  Follow.

## 2019-07-18 NOTE — Assessment & Plan Note (Signed)
Colonoscopy 05/2017 - clear.  Follow.  

## 2019-07-18 NOTE — Assessment & Plan Note (Signed)
Doing well on atenolol.

## 2019-07-18 NOTE — Assessment & Plan Note (Signed)
Saw endocrinology.  On fosamax.  Follow.

## 2019-08-17 DIAGNOSIS — M810 Age-related osteoporosis without current pathological fracture: Secondary | ICD-10-CM | POA: Diagnosis not present

## 2019-08-17 DIAGNOSIS — E559 Vitamin D deficiency, unspecified: Secondary | ICD-10-CM | POA: Diagnosis not present

## 2019-08-25 ENCOUNTER — Ambulatory Visit: Payer: Medicare Other | Admitting: Internal Medicine

## 2019-08-25 ENCOUNTER — Ambulatory Visit (INDEPENDENT_AMBULATORY_CARE_PROVIDER_SITE_OTHER): Payer: Medicare PPO

## 2019-08-25 VITALS — Ht 66.0 in | Wt 158.0 lb

## 2019-08-25 DIAGNOSIS — Z0000 Encounter for general adult medical examination without abnormal findings: Secondary | ICD-10-CM

## 2019-08-25 NOTE — Progress Notes (Addendum)
Subjective:   Claudia Little is a 74 y.o. female who presents for Medicare Annual (Subsequent) preventive examination.  No ROS.  Medicare Wellness Virtual Visit.  Visual/audio telehealth visit, UTA vital signs.   Ht/Wt provided.  See social history for additional risk factors.     Cardiac Risk Factors include: advanced age (>9men, >56 women)     Objective:     Vitals: Ht 5\' 6"  (1.676 m)   Wt 158 lb (71.7 kg)   BMI 25.50 kg/m   Body mass index is 25.5 kg/m.  Advanced Directives 08/25/2019 06/04/2019 08/22/2018 05/01/2018 08/20/2017 05/27/2017 04/17/2017  Does Patient Have a Medical Advance Directive? Yes Yes Yes No Yes Yes No  Type of Paramedic of [place];Living will [place];Living will Living will;Healthcare Power of [place];Living will [place];Living will -  Does patient want to make changes to medical advance directive? No - Patient declined No - Patient declined No - Patient declined - No - Patient declined - -  Copy of [place] in Chart? No - copy requested No - copy requested No - copy requested - No - copy requested Yes -  Would patient like information on creating a medical advance directive? - - - No - Patient declined - - No - Patient declined    Tobacco Social History   Tobacco Use  Smoking Status Never Smoker  Smokeless Tobacco Former Engineer, structural given: Not Answered   Clinical Intake:  Pre-visit preparation completed: Yes        Diabetes: No  How often do you need to have someone help you when you read instructions, pamphlets, or other written materials from your doctor or pharmacy?: 1 - Never  Interpreter Needed?: No     Past Medical History:  Diagnosis Date  . Breast cancer ([name]) 12/2013   right breast cancer  . Chronic anxiety   . Dysrhythmia    SVT  . History of kidney stones   . Insomnia   . Nephrolithiasis   .  Osteoporosis   . PAC (premature atrial contraction)   . Palpitations   . Personal history of radiation therapy 2015   Right breast  . SVT (supraventricular tachycardia) (HCC)    Past Surgical History:  Procedure Laterality Date  . BREAST CYST ASPIRATION Left 10+ yrs ago   neg  . BREAST EXCISIONAL BIOPSY Right 12/15/2013   DCIS  . BREAST SURGERY Right 12/15/2013   lumpectomy  . BUNIONECTOMY  early '70s  . COLONOSCOPY WITH PROPOFOL N/A 05/27/2017   Procedure: COLONOSCOPY WITH PROPOFOL;  Surgeon: Lollie Sails, MD;  Location[facility] END[facility]COPY;  Service: Endoscopy;  Laterality: N/A;  . DILATION AND CURETTAGE OF UTERUS  1973   after miscarriage  . LITHOTRIPSY     Family History  Problem Relation Age of Onset  . Aortic stenosis Mother   . Congestive Heart Failure Mother   . Aortic stenosis Father   . Coronary artery disease Father        Deceased from complications of atherosclerotic CAD and Atherosclerosis  . Congestive Heart Failure Father   . Parkinsonism Brother   . Colon polyps Sister   . Arthritis Maternal Grandfather   . Early death Paternal Grandmother   . Heart block Brother   . Stroke Maternal Grandmother   . Kidney disease Paternal Grandfather   . Breast cancer Maternal Aunt 70  . Ovarian cancer Maternal  Aunt    Social History   Socioeconomic History  . Marital status: Married    Spouse name: Richard  . Number of children: 1  . Years of education: HS  . Highest education level: Not on file  Occupational History  . Occupation: Retired  Tobacco Use  . Smoking status: Never Smoker  . Smokeless tobacco: Former Network engineer and Sexual Activity  . Alcohol use: Yes    Alcohol/week: 7.0 standard drinks    Types: 7 Glasses of wine per week  . Drug use: No  . Sexual activity: Not Currently  Other Topics Concern  . Not on file  Social History Narrative  . Not on file   Social Determinants of Health   Financial Resource Strain:   . Difficulty of Paying  Living Expenses:   Food Insecurity:   . Worried About Charity fundraiser in the Last Year:   . Arboriculturist in the Last Year:   Transportation Needs:   . Film/video editor (Medical):   Marland Kitchen Lack of Transportation (Non-Medical):   Physical Activity:   . Days of Exercise per Week:   . Minutes of Exercise per Session:   Stress:   . Feeling of Stress :   Social Connections:   . Frequency of Communication with Friends and Family:   . Frequency of Social Gatherings with Friends and Family:   . Attends Religious Services:   . Active Member of Clubs or Organizations:   . Attends Archivist Meetings:   Marland Kitchen Marital Status:     Outpatient Encounter Medications as of 08/25/2019  Medication Sig  . alendronate (FOSAMAX) 70 MG tablet Take 1 tablet by mouth every 7 (seven) days.  Marland Kitchen atenolol (TENORMIN) 25 MG tablet TAKE ONE TABLET BY MOUTH EVERY DAY  . Cholecalciferol 1000 units capsule Take 1 capsule (1,000 Units total) by mouth daily. (Patient taking differently: Take 4,000 Units by mouth daily. )  . Coenzyme Q10 100 MG TABS Take 1 tablet (100 mg total) by mouth daily.  . Probiotic Product (PROBIOTIC DAILY PO) Take 1 capsule by mouth daily.  . rosuvastatin (CRESTOR) 5 MG tablet TAKE 1 TABLET BY MOUTH DAILY   No facility-administered encounter medications on file as of 08/25/2019.    Activities of Daily Living In your present state of health, do you have any difficulty performing the following activities: 08/25/2019  Hearing? N  Vision? N  Difficulty concentrating or making decisions? N  Walking or climbing stairs? N  Dressing or bathing? N  Doing errands, shopping? N  Preparing Food and eating ? N  Using the Toilet? N  In the past six months, have you accidently leaked urine? N  Do you have problems with loss of bowel control? N  Managing your Medications? N  Managing your Finances? N  Housekeeping or managing your Housekeeping? N  Some recent data might be hidden     Patient Care Team: Einar Pheasant, MD as PCP - General (Internal Medicine)    Assessment:   This is a routine wellness examination for Chisa.  I connected with Foye Deer today by telephone and verified that I am speaking with the correct person using two identifiers. Location patient: home Location provider: work Persons participating in the virtual visit: patient, provider.   I discussed the limitations, risks, security and privacy concerns of performing an evaluation and management service by telephone and the availability of in person appointments. I also discussed with the patient that  there may be a patient responsible charge related to this service. The patient expressed understanding and verbally consented to this telephonic visit.    Interactive audio and video telecommunications were attempted between this provider and patient, however failed, due to patient having technical difficulties OR patient did not have access to video capability.  We continued and completed visit with audio only.  Some vital signs may be absent or patient reported.   Patient is alert and oriented x3. Patient denies difficulty focusing or concentrating. Patient likes to play bridge card game online for brain health.  Health Maintenance Due: -See completed HM at the end of note.   Eye: Visual acuity not assessed. Virtual visit. Followed by their ophthalmologist.  Dental: UTD  Hearing: Demonstrates normal hearing during visit.  Safety:  Patient feels safe at home- yes Patient does have smoke detectors at home- yes Patient does wear sunscreen or protective clothing when in direct sunlight - yes Patient does wear seat belt when in a moving vehicle - yes Patient drives- yes Adequate lighting in walkways free from debris- yes Grab bars and handrails used as appropriate- yes Ambulates with an assistive device- no  Social: Alcohol intake - yes      Smoking/smokeless tobacco history-  former  Smokers in home? none Illicit drug use? none  Medication: Taking as directed and without issues.  Self managed - yes  Vitamin D 4000U added.   Covid-19: Precautions and sickness symptoms discussed. Wears mask, social distancing, hand hygiene as appropriate.   Activities of Daily Living Patient denies needing assistance with: household chores, feeding themselves, getting from bed to chair, getting to the toilet, bathing/showering, dressing, managing money, or preparing meals.   Discussed the importance of a healthy diet, water intake and the benefits of aerobic exercise.  Physical activity- active in the yard. Walking.   Diet:  Regular Water: good intake  Other Providers Patient Care Team: Einar Pheasant, MD as PCP - General (Internal Medicine)  Exercise Activities and Dietary recommendations Current Exercise Habits: Home exercise routine, Type of exercise: walking, Intensity: Mild  Goals      Patient Stated   . DIET -LOW CHOLESTEROL DIET (pt-stated)    . Exercise 150 minutes per week (moderate activity) (pt-stated)     She plans to increase her physical activity when possible. Prior to covid 19 she was doing yoga and walking daily.        Fall Risk Fall Risk  08/25/2019 08/22/2018 08/22/2018 08/20/2017 04/17/2017  Falls in the past year? 0 0 0 No No  Number falls in past yr: - 0 - - -  Injury with Fall? - 0 - - -  Follow up Falls evaluation completed Falls evaluation completed - - -   Timed Get Up and Go performed: no, virtual visit  Depression Screen PHQ 2/9 Scores 08/25/2019 08/22/2018 08/22/2018 08/20/2017  PHQ - 2 Score 0 0 0 0  PHQ- 9 Score - 0 - -     Cognitive Function MMSE - Mini Mental State Exam 08/20/2017  Orientation to time 5  Orientation to Place 5  Registration 3  Attention/ Calculation 5  Recall 3  Language- name 2 objects 2  Language- repeat 1  Language- follow 3 step command 3  Language- read & follow direction 1  Write a sentence 1   Copy design 1  Total score 30     6CIT Screen 08/22/2018  What Year? 0 points  What month? 0 points  What time? 0 points  Count back from 20 0 points  Months in reverse 0 points  Repeat phrase 0 points  Total Score 0    Immunization History  Administered Date(s) Administered  . Influenza Split 02/14/2012  . Influenza, High Dose Seasonal PF 01/08/2019  . Influenza-Unspecified 01/24/2017, 12/03/2017  . PFIZER SARS-COV-2 Vaccination 04/06/2019, 04/27/2019  . Pneumococcal Conjugate-13 03/22/2014, 04/10/2017  . Pneumococcal Polysaccharide-23 01/16/2011, 04/10/2017  . Tdap 03/22/2014  . Zoster 07/13/2010  . Zoster Recombinat (Shingrix) 08/21/2017, 11/11/2017   Screening Tests Health Maintenance  Topic Date Due  . INFLUENZA VACCINE  11/01/2019  . MAMMOGRAM  12/03/2020  . TETANUS/TDAP  03/22/2024  . COLONOSCOPY  05/28/2027  . DEXA SCAN  Completed  . COVID-19 Vaccine  Completed  . Hepatitis C Screening  Completed  . PNA vac Low Risk Adult  Completed      Plan:   Keep all routine maintenance appointments.   Next scheduled fasting lab 01/11/20 @ 9:00  Cpe 01/14/20 @ 10:00  Medicare Attestation I have personally reviewed: The patient's medical and social history Their use of alcohol, tobacco or illicit drugs Their current medications and supplements The patient's functional ability including ADLs,fall risks, home safety risks, cognitive, and hearing and visual impairment Diet and physical activities Evidence for depression   I have reviewed and discussed with patient certain preventive protocols, quality metrics, and best practice recommendations.      Varney Biles, LPN  [id number]   Reviewed above information.  Agree with assessment and plan.   Dr Nicki Reaper

## 2019-08-25 NOTE — Patient Instructions (Addendum)
  Ms. Klomp , Thank you for taking time to come for your Medicare Wellness Visit. I appreciate your ongoing commitment to your health goals. Please review the following plan we discussed and let me know if I can assist you in the future.   These are the goals we discussed: Goals      Patient Stated   . DIET -LOW CHOLESTEROL DIET (pt-stated)    . Exercise 150 minutes per week (moderate activity) (pt-stated)     She plans to increase her physical activity when possible. Prior to covid 19 she was doing yoga and walking daily.        This is a list of the screening recommended for you and due dates:  Health Maintenance  Topic Date Due  . Flu Shot  11/01/2019  . Mammogram  12/03/2020  . Tetanus Vaccine  03/22/2024  . Colon Cancer Screening  05/28/2027  . DEXA scan (bone density measurement)  Completed  . COVID-19 Vaccine  Completed  .  Hepatitis C: One time screening is recommended by Center for Disease Control  (CDC) for  adults born from [birth year] through 1966.   Completed  . Pneumonia vaccines  Completed

## 2019-09-29 DIAGNOSIS — H2513 Age-related nuclear cataract, bilateral: Secondary | ICD-10-CM | POA: Diagnosis not present

## 2019-10-15 DIAGNOSIS — Z85828 Personal history of other malignant neoplasm of skin: Secondary | ICD-10-CM | POA: Diagnosis not present

## 2019-10-15 DIAGNOSIS — L728 Other follicular cysts of the skin and subcutaneous tissue: Secondary | ICD-10-CM | POA: Diagnosis not present

## 2019-10-15 DIAGNOSIS — D2262 Melanocytic nevi of left upper limb, including shoulder: Secondary | ICD-10-CM | POA: Diagnosis not present

## 2019-10-15 DIAGNOSIS — L821 Other seborrheic keratosis: Secondary | ICD-10-CM | POA: Diagnosis not present

## 2019-10-15 DIAGNOSIS — D2261 Melanocytic nevi of right upper limb, including shoulder: Secondary | ICD-10-CM | POA: Diagnosis not present

## 2019-10-15 DIAGNOSIS — D225 Melanocytic nevi of trunk: Secondary | ICD-10-CM | POA: Diagnosis not present

## 2019-10-22 ENCOUNTER — Other Ambulatory Visit: Payer: Self-pay | Admitting: Internal Medicine

## 2019-10-22 DIAGNOSIS — Z1231 Encounter for screening mammogram for malignant neoplasm of breast: Secondary | ICD-10-CM

## 2019-10-26 ENCOUNTER — Other Ambulatory Visit: Payer: Self-pay | Admitting: Internal Medicine

## 2019-10-26 DIAGNOSIS — E7800 Pure hypercholesterolemia, unspecified: Secondary | ICD-10-CM

## 2019-12-08 DIAGNOSIS — Z20822 Contact with and (suspected) exposure to covid-19: Secondary | ICD-10-CM | POA: Diagnosis not present

## 2019-12-16 ENCOUNTER — Ambulatory Visit
Admission: RE | Admit: 2019-12-16 | Discharge: 2019-12-16 | Disposition: A | Discharge status: Home / Self Care | Payer: Medicare PPO | Source: Ambulatory Visit | Attending: Internal Medicine | Admitting: Internal Medicine

## 2019-12-16 ENCOUNTER — Other Ambulatory Visit: Payer: Self-pay

## 2019-12-16 DIAGNOSIS — Z1231 Encounter for screening mammogram for malignant neoplasm of breast: Secondary | ICD-10-CM | POA: Diagnosis not present

## 2020-01-11 ENCOUNTER — Other Ambulatory Visit: Payer: Self-pay

## 2020-01-11 ENCOUNTER — Other Ambulatory Visit (INDEPENDENT_AMBULATORY_CARE_PROVIDER_SITE_OTHER): Payer: Medicare PPO

## 2020-01-11 DIAGNOSIS — E7800 Pure hypercholesterolemia, unspecified: Secondary | ICD-10-CM | POA: Diagnosis not present

## 2020-01-11 LAB — CBC WITH DIFFERENTIAL/PLATELET
Basophils Absolute: 0.1 10*3/uL (ref 0.0–0.1)
Basophils Relative: 1.3 % (ref 0.0–3.0)
Eosinophils Absolute: 0.5 10*3/uL (ref 0.0–0.7)
Eosinophils Relative: 7.5 % — ABNORMAL HIGH (ref 0.0–5.0)
HCT: 39.1 % (ref 36.0–46.0)
Hemoglobin: 13.4 g/dL (ref 12.0–15.0)
Lymphocytes Relative: 27 % (ref 12.0–46.0)
Lymphs Abs: 1.8 10*3/uL (ref 0.7–4.0)
MCHC: 34.3 g/dL (ref 30.0–36.0)
MCV: 94.6 fl (ref 78.0–100.0)
Monocytes Absolute: 0.6 10*3/uL (ref 0.1–1.0)
Monocytes Relative: 9.5 % (ref 3.0–12.0)
Neutro Abs: 3.7 10*3/uL (ref 1.4–7.7)
Neutrophils Relative %: 54.7 % (ref 43.0–77.0)
Platelets: 244 10*3/uL (ref 150.0–400.0)
RBC: 4.14 Mil/uL (ref 3.87–5.11)
RDW: 13.2 % (ref 11.5–15.5)
WBC: 6.7 10*3/uL (ref 4.0–10.5)

## 2020-01-11 LAB — LIPID PANEL
Cholesterol: 174 mg/dL (ref 0–200)
HDL: 50.6 mg/dL (ref >39.00)
NonHDL: 123.08
Total CHOL/HDL Ratio: 3
Triglycerides: 240 mg/dL — ABNORMAL HIGH (ref 0.0–149.0)
VLDL: 48 mg/dL — ABNORMAL HIGH (ref 0.0–40.0)

## 2020-01-11 LAB — BASIC METABOLIC PANEL
BUN: 16 mg/dL (ref 6–23)
CO2: 28 mEq/L (ref 19–32)
Calcium: 8.9 mg/dL (ref 8.4–10.5)
Chloride: 104 mEq/L (ref 96–112)
Creatinine, Ser: 0.97 mg/dL (ref 0.40–1.20)
GFR: 57 mL/min — ABNORMAL LOW (ref >60.00)
Glucose, Bld: 96 mg/dL (ref 70–99)
Potassium: 3.8 mEq/L (ref 3.5–5.1)
Sodium: 140 mEq/L (ref 135–145)

## 2020-01-11 LAB — LDL CHOLESTEROL, DIRECT: Direct LDL: 98 mg/dL

## 2020-01-11 LAB — HEPATIC FUNCTION PANEL
ALT: 20 U/L (ref 0–35)
AST: 19 U/L (ref 0–37)
Albumin: 4.4 g/dL (ref 3.5–5.2)
Alkaline Phosphatase: 60 U/L (ref 39–117)
Bilirubin, Direct: 0.1 mg/dL (ref 0.0–0.3)
Total Bilirubin: 0.7 mg/dL (ref 0.2–1.2)
Total Protein: 7.1 g/dL (ref 6.0–8.3)

## 2020-01-11 LAB — TSH: TSH: 2.15 u[IU]/mL (ref 0.35–4.50)

## 2020-01-13 ENCOUNTER — Encounter: Payer: Medicare PPO | Admitting: Internal Medicine

## 2020-01-14 ENCOUNTER — Ambulatory Visit (INDEPENDENT_AMBULATORY_CARE_PROVIDER_SITE_OTHER): Payer: Medicare PPO | Admitting: Internal Medicine

## 2020-01-14 ENCOUNTER — Other Ambulatory Visit: Payer: Self-pay

## 2020-01-14 VITALS — BP 108/68 | HR 58 | Temp 98.0°F | Resp 16 | Ht 66.0 in | Wt 153.4 lb

## 2020-01-14 DIAGNOSIS — I471 Supraventricular tachycardia, unspecified: Secondary | ICD-10-CM

## 2020-01-14 DIAGNOSIS — E7800 Pure hypercholesterolemia, unspecified: Secondary | ICD-10-CM

## 2020-01-14 DIAGNOSIS — M8580 Other specified disorders of bone density and structure, unspecified site: Secondary | ICD-10-CM

## 2020-01-14 DIAGNOSIS — Z0000 Encounter for general adult medical examination without abnormal findings: Secondary | ICD-10-CM

## 2020-01-14 DIAGNOSIS — G4700 Insomnia, unspecified: Secondary | ICD-10-CM

## 2020-01-14 DIAGNOSIS — Z853 Personal history of malignant neoplasm of breast: Secondary | ICD-10-CM | POA: Diagnosis not present

## 2020-01-14 MED ORDER — HYDROXYZINE HCL 10 MG PO TABS: 10.0000 mg | ORAL_TABLET | Freq: Every evening | ORAL | 0 refills | Status: DC | PRN

## 2020-01-14 NOTE — Assessment & Plan Note (Signed)
Physical today 01/14/20.  Mammogram 12/18/19 - Birads I.  Colonoscopy 05/2017 - normal.

## 2020-01-14 NOTE — Progress Notes (Signed)
Patient ID: Claudia Little, female   DOB: [birth year], 74 y.o.   MRN: [id number]   Subjective:    Patient ID: Claudia Little, female    DOB: 1945-07-31, 74 y.o.   MRN: [id number]  HPI This visit occurred during the SARS-CoV-2 public health emergency.  Safety protocols were in place, including screening questions prior to the visit, additional usage of staff PPE, and extensive cleaning of exam room while observing appropriate contact time as indicated for disinfecting solutions.  Patient here for her physical exam.  She reports she is doing relatively well.  Tries to stay active. No chest pain or sob. Had question about her mammogram.  Mammogram ok.  No nausea or vomiting reported.  No abdominal pain.  Bowels moving.  Taking hydrozyzine to help sleep.  Discussed labs.  Information - [facility] Lipid diet.  Lesion left cheek and forehead.  Wants to hold on dermatology referral.  Seeing endocrinology. On fosamax.    Past Medical History:  Diagnosis Date  . Breast cancer ([place]) 12/2013   right breast cancer  . Chronic anxiety   . Dysrhythmia    SVT  . History of kidney stones   . Insomnia   . Nephrolithiasis   . Osteoporosis   . PAC (premature atrial contraction)   . Palpitations   . Personal history of radiation therapy 2015   Right breast  . SVT (supraventricular tachycardia) (HCC)    Past Surgical History:  Procedure Laterality Date  . BREAST CYST ASPIRATION Left 10+ yrs ago   neg  . BREAST EXCISIONAL BIOPSY Right 12/15/2013   DCIS  . BREAST SURGERY Right 12/15/2013   lumpectomy  . BUNIONECTOMY  early '70s  . COLONOSCOPY WITH PROPOFOL N/A 05/27/2017   Procedure: COLONOSCOPY WITH PROPOFOL;  Surgeon: Lollie Sails, MD;  Location[facility] ENDOSCOPY;  Service: Endoscopy;  Laterality: N/A;  . DILATION AND CURETTAGE OF UTERUS  1973   after miscarriage  . LITHOTRIPSY     Family History  Problem Relation Age of Onset  . Aortic stenosis Mother   . Congestive Heart Failure Mother   . Aortic  stenosis Father   . Coronary artery disease Father        Deceased from complications of atherosclerotic CAD and Atherosclerosis  . Congestive Heart Failure Father   . Parkinsonism Brother   . Colon polyps Sister   . Arthritis Maternal Grandfather   . Early death Paternal Grandmother   . Heart block Brother   . Stroke Maternal Grandmother   . Kidney disease Paternal Grandfather   . Breast cancer Maternal Aunt 70  . Ovarian cancer Maternal Aunt    Social History   Socioeconomic History  . Marital status: Married    Spouse name: Richard  . Number of children: 1  . Years of education: HS  . Highest education level: Not on file  Occupational History  . Occupation: Retired  Tobacco Use  . Smoking status: Never Smoker  . Smokeless tobacco: Former Network engineer and Sexual Activity  . Alcohol use: Yes    Alcohol/week: 7.0 standard drinks    Types: 7 Glasses of wine per week  . Drug use: No  . Sexual activity: Not Currently  Other Topics Concern  . Not on file  Social History Narrative  . Not on file   Social Determinants of Health   Financial Resource Strain:   . Difficulty of Paying Living Expenses: Not on file  Food Insecurity:   . Worried About Running  Out of Food in the Last Year: Not on file  . Ran Out of Food in the Last Year: Not on file  Transportation Needs:   . Lack of Transportation (Medical): Not on file  . Lack of Transportation (Non-Medical): Not on file  Physical Activity:   . Days of Exercise per Week: Not on file  . Minutes of Exercise per Session: Not on file  Stress:   . Feeling of Stress : Not on file  Social Connections:   . Frequency of Communication with Friends and Family: Not on file  . Frequency of Social Gatherings with Friends and Family: Not on file  . Attends Religious Services: Not on file  . Active Member of Clubs or Organizations: Not on file  . Attends Archivist Meetings: Not on file  . Marital Status: Not on file     Outpatient Encounter Medications as of 01/14/2020  Medication Sig  . alendronate (FOSAMAX) 70 MG tablet Take 1 tablet by mouth every 7 (seven) days.  Marland Kitchen atenolol (TENORMIN) 25 MG tablet TAKE ONE TABLET BY MOUTH EVERY DAY  . Cholecalciferol 1000 units capsule Take 1 capsule (1,000 Units total) by mouth daily. (Patient taking differently: Take 4,000 Units by mouth daily. )  . Coenzyme Q10 100 MG TABS Take 1 tablet (100 mg total) by mouth daily.  . hydrOXYzine (ATARAX/VISTARIL) 10 MG tablet Take 1 tablet (10 mg total) by mouth at bedtime as needed.  . Probiotic Product (PROBIOTIC DAILY PO) Take 1 capsule by mouth daily.  . rosuvastatin (CRESTOR) 5 MG tablet TAKE 1 TABLET BY MOUTH DAILY   No facility-administered encounter medications on file as of 01/14/2020.    Review of Systems  Constitutional: Negative for appetite change and unexpected weight change.  HENT: Negative for congestion, sinus pressure and sore throat.   Eyes: Negative for pain and visual disturbance.  Respiratory: Negative for cough, chest tightness and shortness of breath.   Cardiovascular: Negative for chest pain, palpitations and leg swelling.  Gastrointestinal: Negative for abdominal pain, diarrhea, nausea and vomiting.  Genitourinary: Negative for difficulty urinating and dysuria.  Musculoskeletal: Negative for joint swelling and myalgias.  Skin: Negative for color change and rash.  Neurological: Negative for dizziness, light-headedness and headaches.  Hematological: Negative for adenopathy. Does not bruise/bleed easily.  Psychiatric/Behavioral: Negative for agitation and dysphoric mood.       Objective:    Physical Exam Vitals reviewed.  Constitutional:      General: She is not in acute distress.    Appearance: Normal appearance. She is well-developed.  HENT:     Head: Normocephalic and atraumatic.     Right Ear: External ear normal.     Left Ear: External ear normal.  Eyes:     General: No scleral  icterus.       Right eye: No discharge.        Left eye: No discharge.     Conjunctiva/sclera: Conjunctivae normal.  Neck:     Thyroid: No thyromegaly.  Cardiovascular:     Rate and Rhythm: Normal rate and regular rhythm.  Pulmonary:     Effort: No tachypnea, accessory muscle usage or respiratory distress.     Breath sounds: Normal breath sounds. No decreased breath sounds or wheezing.  Chest:     Breasts:        Right: No inverted nipple, mass, nipple discharge or tenderness (no axillary adenopathy).        Left: No inverted nipple, mass, nipple discharge or  tenderness (no axilarry adenopathy).  Abdominal:     General: Bowel sounds are normal.     Palpations: Abdomen is soft.     Tenderness: There is no abdominal tenderness.  Musculoskeletal:        General: No swelling or tenderness.     Cervical back: Neck supple. No tenderness.  Lymphadenopathy:     Cervical: No cervical adenopathy.  Skin:    Findings: No erythema or rash.  Neurological:     Mental Status: She is alert and oriented to person, place, and time.  Psychiatric:        Mood and Affect: Mood normal.        Behavior: Behavior normal.     BP 108/68   Pulse (!) 58   Temp 98 F (36.7 C) (Oral)   Resp 16   Ht 5\' 6"  (1.676 m)   Wt 153 lb 6.4 oz (69.6 kg)   SpO2 98%   BMI 24.76 kg/m  Wt Readings from Last 3 Encounters:  01/14/20 153 lb 6.4 oz (69.6 kg)  08/25/19 158 lb (71.7 kg)  07/13/19 158 lb 12.8 oz (72 kg)     Lab Results  Component Value Date   WBC 6.7 01/11/2020   HGB 13.4 01/11/2020   HCT 39.1 01/11/2020   PLT 244.0 01/11/2020   GLUCOSE 96 01/11/2020   CHOL 174 01/11/2020   TRIG 240.0 (H) 01/11/2020   HDL 50.60 01/11/2020   LDLDIRECT 98.0 01/11/2020   LDLCALC 84 07/07/2019   ALT 20 01/11/2020   AST 19 01/11/2020   NA 140 01/11/2020   K 3.8 01/11/2020   CL 104 01/11/2020   CREATININE 0.97 01/11/2020   BUN 16 01/11/2020   CO2 28 01/11/2020   TSH 2.15 01/11/2020   HGBA1C 5.3  01/09/2019    MM 3D SCREEN BREAST BILATERAL  Result Date: 12/18/2019 CLINICAL DATA:  Screening. EXAM: DIGITAL SCREENING BILATERAL MAMMOGRAM WITH TOMO AND CAD COMPARISON:  Previous exam(s). ACR Breast Density Category c: The breast tissue is heterogeneously dense, which may obscure small masses. FINDINGS: There are no findings suspicious for malignancy. Images were processed with CAD. IMPRESSION: No mammographic evidence of malignancy. A result letter of this screening mammogram will be mailed directly to the patient. RECOMMENDATION: Screening mammogram in one year. (Code:SM-B-01Y) BI-RADS CATEGORY  1: Negative. Electronically Signed   By: Nolon Nations M.D.   On: 12/18/2019 08:55       Assessment & Plan:   Problem List Items Addressed This Visit    Supraventricular tachycardia ([place])    Doing well on atenolol.  Follow.       osteoporosis    On fosamax.  Continue calcium, vitamin D and weight bearing exercise.       Insomnia    Has hydroxyzine.  Follow.       Hypercholesteremia    On crestor.  Low cholesterol diet and exercise. Follow lipid panel and liver function tests.        Relevant Orders   Vitamin B12   Hepatic function panel   Lipid panel   Basic metabolic panel   History of breast cancer   Healthcare maintenance    Physical today 01/14/20.  Mammogram 12/18/19 - Birads I.  Colonoscopy 05/2017 - normal.         Other Visit Diagnoses    Routine general medical examination at a health care facility    -  Primary       Einar Pheasant, MD

## 2020-01-24 ENCOUNTER — Encounter: Payer: Self-pay | Admitting: Internal Medicine

## 2020-01-24 NOTE — Assessment & Plan Note (Signed)
On crestor.  Low cholesterol diet and exercise.  Follow lipid panel and liver function tests.   

## 2020-01-24 NOTE — Assessment & Plan Note (Signed)
Doing well on atenolol.  Follow.

## 2020-01-24 NOTE — Assessment & Plan Note (Signed)
On fosamax.  Continue calcium, vitamin D and weight bearing exercise.

## 2020-01-24 NOTE — Assessment & Plan Note (Signed)
Has hydroxyzine.  Follow.

## 2020-04-12 ENCOUNTER — Other Ambulatory Visit: Payer: Self-pay | Admitting: Internal Medicine

## 2020-04-12 DIAGNOSIS — I471 Supraventricular tachycardia: Secondary | ICD-10-CM

## 2020-05-16 ENCOUNTER — Other Ambulatory Visit (INDEPENDENT_AMBULATORY_CARE_PROVIDER_SITE_OTHER): Payer: Medicare PPO

## 2020-05-16 ENCOUNTER — Other Ambulatory Visit: Payer: Self-pay

## 2020-05-16 DIAGNOSIS — E7800 Pure hypercholesterolemia, unspecified: Secondary | ICD-10-CM | POA: Diagnosis not present

## 2020-05-16 LAB — LIPID PANEL
Cholesterol: 171 mg/dL (ref 0–200)
HDL: 54 mg/dL (ref >39.00)
LDL Cholesterol: 82 mg/dL (ref 0–99)
NonHDL: 116.8
Total CHOL/HDL Ratio: 3
Triglycerides: 174 mg/dL — ABNORMAL HIGH (ref 0.0–149.0)
VLDL: 34.8 mg/dL (ref 0.0–40.0)

## 2020-05-16 LAB — HEPATIC FUNCTION PANEL
ALT: 20 U/L (ref 0–35)
AST: 20 U/L (ref 0–37)
Albumin: 4.5 g/dL (ref 3.5–5.2)
Alkaline Phosphatase: 64 U/L (ref 39–117)
Bilirubin, Direct: 0.1 mg/dL (ref 0.0–0.3)
Total Bilirubin: 0.6 mg/dL (ref 0.2–1.2)
Total Protein: 7.3 g/dL (ref 6.0–8.3)

## 2020-05-16 LAB — BASIC METABOLIC PANEL
BUN: 11 mg/dL (ref 6–23)
CO2: 27 mEq/L (ref 19–32)
Calcium: 9.3 mg/dL (ref 8.4–10.5)
Chloride: 103 mEq/L (ref 96–112)
Creatinine, Ser: 0.99 mg/dL (ref 0.40–1.20)
GFR: 55.72 mL/min — ABNORMAL LOW (ref >60.00)
Glucose, Bld: 94 mg/dL (ref 70–99)
Potassium: 4 mEq/L (ref 3.5–5.1)
Sodium: 138 mEq/L (ref 135–145)

## 2020-05-16 LAB — VITAMIN B12: Vitamin B-12: 187 pg/mL — ABNORMAL LOW (ref 211–911)

## 2020-05-18 ENCOUNTER — Encounter: Payer: Self-pay | Admitting: Internal Medicine

## 2020-05-18 ENCOUNTER — Ambulatory Visit: Payer: Medicare PPO | Admitting: Internal Medicine

## 2020-05-18 ENCOUNTER — Other Ambulatory Visit: Payer: Self-pay

## 2020-05-18 DIAGNOSIS — E538 Deficiency of other specified B group vitamins: Secondary | ICD-10-CM

## 2020-05-18 DIAGNOSIS — M8580 Other specified disorders of bone density and structure, unspecified site: Secondary | ICD-10-CM | POA: Diagnosis not present

## 2020-05-18 DIAGNOSIS — I471 Supraventricular tachycardia: Secondary | ICD-10-CM | POA: Diagnosis not present

## 2020-05-18 DIAGNOSIS — E7800 Pure hypercholesterolemia, unspecified: Secondary | ICD-10-CM | POA: Diagnosis not present

## 2020-05-18 DIAGNOSIS — D0511 Intraductal carcinoma in situ of right breast: Secondary | ICD-10-CM | POA: Diagnosis not present

## 2020-05-18 DIAGNOSIS — M7989 Other specified soft tissue disorders: Secondary | ICD-10-CM

## 2020-05-18 DIAGNOSIS — G4700 Insomnia, unspecified: Secondary | ICD-10-CM | POA: Diagnosis not present

## 2020-05-18 NOTE — Progress Notes (Addendum)
Patient ID: Claudia Little, female   DOB: 09/23/1945, 75 y.o.   MRN: [id number]   Subjective:    Patient ID: Claudia Little, female    DOB: [birth year], 75 y.o.   MRN: [id number]  HPI This visit occurred during the SARS-CoV-2 public health emergency.  Safety protocols were in place, including screening questions prior to the visit, additional usage of staff PPE, and extensive cleaning of exam room while observing appropriate contact time as indicated for disinfecting solutions.  Patient here for a scheduled follow up.  Here to follow up regarding her cholesterol nad SVT.  Increased stress.  Husband s/p back surgery.  Discussed.  Overall appears to be handling things relatively well.  Has hydroxyzine to help with sleep.  Discussed taking two since one does not appear to be fully effective.  No chest pain or sob reported. No abdominal pain.  Discussed labs.  Discussed increasing crestor to daily.     Past Medical History:  Diagnosis Date  . Breast cancer ([place]) 12/2013   right breast cancer  . Chronic anxiety   . Dysrhythmia    SVT  . History of kidney stones   . Insomnia   . Nephrolithiasis   . Osteoporosis   . PAC (premature atrial contraction)   . Palpitations   . Personal history of radiation therapy 2015   Right breast  . SVT (supraventricular tachycardia) ([facility])    Past Surgical History:  Procedure Laterality Date  . BREAST CYST ASPIRATION Left 10+ yrs ago   neg  . BREAST EXCISIONAL BIOPSY Right 12/15/2013   DCIS  . BREAST SURGERY Right 12/15/2013   lumpectomy  . BUNIONECTOMY  early '70s  . COLONOSCOPY WITH PROPOFOL N/A 05/27/2017   Procedure: COLONOSCOPY WITH PROPOFOL;  Surgeon: Lollie Sails, MD;  Location: [facility] ENDOSCOPY;  Service: Endoscopy;  Laterality: N/A;  . DILATION AND CURETTAGE OF UTERUS  1973   after miscarriage  . LITHOTRIPSY     Family History  Problem Relation Age of Onset  . Aortic stenosis Mother   . Congestive Heart Failure Mother   . Aortic stenosis  Father   . Coronary artery disease Father        Deceased from complications of atherosclerotic CAD and Atherosclerosis  . Congestive Heart Failure Father   . Parkinsonism Brother   . Colon polyps Sister   . Arthritis Maternal Grandfather   . Early death Paternal Grandmother   . Heart block Brother   . Stroke Maternal Grandmother   . Kidney disease Paternal Grandfather   . Breast cancer Maternal Aunt 70  . Ovarian cancer Maternal Aunt    Social History   Socioeconomic History  . Marital status: Married    Spouse name: Richard  . Number of children: 1  . Years of education: HS  . Highest education level: Not on file  Occupational History  . Occupation: Retired  Tobacco Use  . Smoking status: Never Smoker  . Smokeless tobacco: Former Network engineer and Sexual Activity  . Alcohol use: Yes    Alcohol/week: 7.0 standard drinks    Types: 7 Glasses of wine per week  . Drug use: No  . Sexual activity: Not Currently  Other Topics Concern  . Not on file  Social History Narrative  . Not on file   Social Determinants of Health   Financial Resource Strain: Not on file  Food Insecurity: Not on file  Transportation Needs: Not on file  Physical Activity: Not on file  Stress: Not on file  Social Connections: Not on file    Outpatient Encounter Medications as of 05/18/2020  Medication Sig  . atenolol (TENORMIN) 25 MG tablet TAKE ONE TABLET BY MOUTH EVERY DAY  . Cholecalciferol 1000 units capsule Take 1 capsule (1,000 Units total) by mouth daily. (Patient taking differently: Take 4,000 Units by mouth daily. )  . Coenzyme Q10 100 MG TABS Take 1 tablet (100 mg total) by mouth daily.  . hydrOXYzine (ATARAX/VISTARIL) 10 MG tablet Take 1 tablet (10 mg total) by mouth at bedtime as needed.  . rosuvastatin (CRESTOR) 5 MG tablet TAKE 1 TABLET BY MOUTH DAILY  . [DISCONTINUED] Probiotic Product (PROBIOTIC DAILY PO) Take 1 capsule by mouth daily.   No facility-administered encounter  medications on file as of 05/18/2020.    Review of Systems  Constitutional: Negative for appetite change and unexpected weight change.  HENT: Negative for congestion and sinus pressure.   Respiratory: Negative for cough, chest tightness and shortness of breath.   Cardiovascular: Negative for chest pain, palpitations and leg swelling.  Gastrointestinal: Negative for abdominal pain, diarrhea, nausea and vomiting.  Genitourinary: Negative for difficulty urinating and dysuria.  Musculoskeletal: Negative for joint swelling and myalgias.  Skin: Negative for color change and rash.  Neurological: Negative for dizziness, light-headedness and headaches.  Psychiatric/Behavioral: Negative for agitation and dysphoric mood.       Objective:    Physical Exam Vitals reviewed.  Constitutional:      General: She is not in acute distress.    Appearance: Normal appearance.  HENT:     Head: Normocephalic and atraumatic.     Right Ear: External ear normal.     Left Ear: External ear normal.     Mouth/Throat:     Mouth: Oropharynx is clear and moist.  Eyes:     General: No scleral icterus.       Right eye: No discharge.        Left eye: No discharge.     Conjunctiva/sclera: Conjunctivae normal.  Neck:     Thyroid: No thyromegaly.  Cardiovascular:     Rate and Rhythm: Normal rate and regular rhythm.  Pulmonary:     Effort: No respiratory distress.     Breath sounds: Normal breath sounds. No wheezing.  Abdominal:     General: Bowel sounds are normal.     Palpations: Abdomen is soft.     Tenderness: There is no abdominal tenderness.  Musculoskeletal:        General: No swelling, tenderness or edema.     Cervical back: Neck supple. No tenderness.  Lymphadenopathy:     Cervical: No cervical adenopathy.  Skin:    Findings: No erythema or rash.  Neurological:     Mental Status: She is alert.  Psychiatric:        Mood and Affect: Mood normal.        Behavior: Behavior normal.     BP  116/72   Pulse 66   Temp 98.2 F (36.8 C) (Oral)   Resp 16   Ht 5\' 6"  (1.676 m)   Wt 153 lb (69.4 kg)   SpO2 99%   BMI 24.69 kg/m  Wt Readings from Last 3 Encounters:  05/18/20 153 lb (69.4 kg)  01/14/20 153 lb 6.4 oz (69.6 kg)  08/25/19 158 lb (71.7 kg)     Lab Results  Component Value Date   WBC 6.7 01/11/2020   HGB 13.4 01/11/2020   HCT 39.1 01/11/2020  PLT 244.0 01/11/2020   GLUCOSE 94 05/16/2020   CHOL 171 05/16/2020   TRIG 174.0 (H) 05/16/2020   HDL 54.00 05/16/2020   LDLDIRECT 98.0 01/11/2020   LDLCALC 82 05/16/2020   ALT 20 05/16/2020   AST 20 05/16/2020   NA 138 05/16/2020   K 4.0 05/16/2020   CL 103 05/16/2020   CREATININE 0.99 05/16/2020   BUN 11 05/16/2020   CO2 27 05/16/2020   TSH 2.15 01/11/2020   HGBA1C 5.3 01/09/2019    MM 3D SCREEN BREAST BILATERAL  Result Date: 12/18/2019 CLINICAL DATA:  Screening. EXAM: DIGITAL SCREENING BILATERAL MAMMOGRAM WITH TOMO AND CAD COMPARISON:  Previous exam(s). ACR Breast Density Category c: The breast tissue is heterogeneously dense, which may obscure small masses. FINDINGS: There are no findings suspicious for malignancy. Images were processed with CAD. IMPRESSION: No mammographic evidence of malignancy. A result letter of this screening mammogram will be mailed directly to the patient. RECOMMENDATION: Screening mammogram in one year. (Code:SM-B-01Y) BI-RADS CATEGORY  1: Negative. Electronically Signed   By: Nolon Nations M.D.   On: 12/18/2019 08:55       Assessment & Plan:   Problem List Items Addressed This Visit    B12 deficiency    Start B12 injections - 1061mcg q week x 4 weeks and then 1068mcg q month.  First given today.       Ductal carcinoma in situ (DCIS) of right breast    S/p lumpectomy - right breast.  S/p XRT.  ER/PR positive.  Mammogram 12/18/19 - Bir[facility] I.       Hypercholesteremia    Discussed recent labs.  On crestor.  Increase to daily crestor.  Follow lipid panel and liver function tests.         Relevant Orders   Hepatic function panel   Lipid panel   Basic metabolic panel   Insomnia    Trouble sleeping.  Has used hydroxyzine.  Increase to 10mg  - 2 q hs prn.  Follow. If works well, can change to 25mg  q hs prn.       Mass of soft tissue of neck    Increased soft tissue - left neck.  Discussed.  Consider ultrasound/CT neck to further evaluate.        osteoporosis    Continue fosamax.  Continue calcium, vitamin D and weight bearing exercise.        Supraventricular tachycardia ([place])    Doing well on atenolol.  Continue atenolol.  Follow.           Einar Pheasant, MD

## 2020-05-22 ENCOUNTER — Encounter: Payer: Self-pay | Admitting: Internal Medicine

## 2020-05-22 DIAGNOSIS — M7989 Other specified soft tissue disorders: Secondary | ICD-10-CM | POA: Insufficient documentation

## 2020-05-22 DIAGNOSIS — E538 Deficiency of other specified B group vitamins: Secondary | ICD-10-CM | POA: Insufficient documentation

## 2020-05-22 NOTE — Assessment & Plan Note (Signed)
Doing well on atenolol.  Continue atenolol.  Follow.

## 2020-05-22 NOTE — Assessment & Plan Note (Signed)
Increased soft tissue - left neck.  Discussed.  Consider ultrasound/CT neck to further evaluate.

## 2020-05-22 NOTE — Assessment & Plan Note (Addendum)
Trouble sleeping.  Has used hydroxyzine.  Increase to 10mg  - 2 q hs prn.  Follow. If works well, can change to 25mg  q hs prn.

## 2020-05-22 NOTE — Assessment & Plan Note (Signed)
S/p lumpectomy - right breast.  S/p XRT.  ER/PR positive.  Mammogram 12/18/19 - Birads I.

## 2020-05-22 NOTE — Assessment & Plan Note (Signed)
Continue fosamax.  Continue calcium, vitamin D and weight bearing exercise.

## 2020-05-22 NOTE — Assessment & Plan Note (Signed)
Start B12 injections - 1016mcg q week x 4 weeks and then 1096mcg q month.  First given today.

## 2020-05-22 NOTE — Assessment & Plan Note (Signed)
Discussed recent labs.  On crestor.  Increase to daily crestor.  Follow lipid panel and liver function tests.

## 2020-05-25 ENCOUNTER — Ambulatory Visit (INDEPENDENT_AMBULATORY_CARE_PROVIDER_SITE_OTHER): Payer: Medicare PPO | Admitting: *Deleted

## 2020-05-25 ENCOUNTER — Other Ambulatory Visit: Payer: Self-pay

## 2020-05-25 DIAGNOSIS — E538 Deficiency of other specified B group vitamins: Secondary | ICD-10-CM

## 2020-05-25 MED ORDER — CYANOCOBALAMIN 1000 MCG/ML IJ SOLN
1000.0000 ug | Freq: Once | INTRAMUSCULAR | Status: AC
  Administered 2020-05-25: 1000 ug via INTRAMUSCULAR

## 2020-05-25 NOTE — Progress Notes (Signed)
Patient presented for B 12 injection to left deltoid, patient voiced no concerns nor showed any signs of distress during injection. 

## 2020-05-31 ENCOUNTER — Encounter: Payer: Self-pay | Admitting: Internal Medicine

## 2020-05-31 DIAGNOSIS — M7989 Other specified soft tissue disorders: Secondary | ICD-10-CM

## 2020-05-31 NOTE — Telephone Encounter (Signed)
I do not see anything about this 

## 2020-05-31 NOTE — Telephone Encounter (Signed)
Order placed for ENT referral.   

## 2020-06-01 ENCOUNTER — Ambulatory Visit (INDEPENDENT_AMBULATORY_CARE_PROVIDER_SITE_OTHER): Payer: Medicare PPO

## 2020-06-01 ENCOUNTER — Other Ambulatory Visit: Payer: Self-pay

## 2020-06-01 DIAGNOSIS — E538 Deficiency of other specified B group vitamins: Secondary | ICD-10-CM

## 2020-06-01 MED ORDER — CYANOCOBALAMIN 1000 MCG/ML IJ SOLN
1000.0000 ug | Freq: Once | INTRAMUSCULAR | Status: AC
  Administered 2020-06-01: 1000 ug via INTRAMUSCULAR

## 2020-06-01 NOTE — Progress Notes (Signed)
Patient presented for B 12 injection to left Ventrogluteal , patient voiced no concerns nor showed any signs of distress during injection   Kaesha Kirsch,cma

## 2020-06-08 ENCOUNTER — Other Ambulatory Visit: Payer: Self-pay

## 2020-06-08 ENCOUNTER — Ambulatory Visit (INDEPENDENT_AMBULATORY_CARE_PROVIDER_SITE_OTHER): Payer: Medicare PPO | Admitting: *Deleted

## 2020-06-08 ENCOUNTER — Ambulatory Visit: Payer: Medicare PPO

## 2020-06-08 DIAGNOSIS — E538 Deficiency of other specified B group vitamins: Secondary | ICD-10-CM

## 2020-06-08 MED ORDER — CYANOCOBALAMIN 1000 MCG/ML IJ SOLN
1000.0000 ug | Freq: Once | INTRAMUSCULAR | Status: AC
  Administered 2020-06-08: 1000 ug via INTRAMUSCULAR

## 2020-06-08 NOTE — Progress Notes (Signed)
Patient presented for B 12 injection to left deltoid, patient voiced no concerns nor showed any signs of distress during injection. 

## 2020-06-15 ENCOUNTER — Ambulatory Visit: Payer: Medicare PPO

## 2020-06-20 ENCOUNTER — Encounter: Payer: Self-pay | Admitting: Internal Medicine

## 2020-06-22 DIAGNOSIS — H6122 Impacted cerumen, left ear: Secondary | ICD-10-CM | POA: Diagnosis not present

## 2020-06-22 DIAGNOSIS — R221 Localized swelling, mass and lump, neck: Secondary | ICD-10-CM | POA: Diagnosis not present

## 2020-06-23 ENCOUNTER — Telehealth: Payer: Self-pay | Admitting: Internal Medicine

## 2020-06-23 MED ORDER — HYDROXYZINE HCL 10 MG PO TABS: 10.0000 mg | ORAL_TABLET | Freq: Every evening | ORAL | 0 refills | Status: DC | PRN

## 2020-06-23 NOTE — Telephone Encounter (Signed)
Patient called in for refill for hydrOXYzine (ATARAX/VISTARIL) 10 MG tablet

## 2020-07-12 ENCOUNTER — Ambulatory Visit (INDEPENDENT_AMBULATORY_CARE_PROVIDER_SITE_OTHER): Payer: Medicare PPO

## 2020-07-12 ENCOUNTER — Other Ambulatory Visit: Payer: Self-pay

## 2020-07-12 DIAGNOSIS — E538 Deficiency of other specified B group vitamins: Secondary | ICD-10-CM

## 2020-07-12 MED ORDER — CYANOCOBALAMIN 1000 MCG/ML IJ SOLN
1000.0000 ug | Freq: Once | INTRAMUSCULAR | Status: AC
  Administered 2020-07-12: 1000 ug via INTRAMUSCULAR

## 2020-07-12 NOTE — Progress Notes (Signed)
Patient presented for B 12 injection to left deltoid, patient voiced no concerns nor showed any signs of distress during injection. 

## 2020-08-12 ENCOUNTER — Ambulatory Visit (INDEPENDENT_AMBULATORY_CARE_PROVIDER_SITE_OTHER): Payer: Medicare PPO

## 2020-08-12 ENCOUNTER — Other Ambulatory Visit: Payer: Self-pay

## 2020-08-12 DIAGNOSIS — E538 Deficiency of other specified B group vitamins: Secondary | ICD-10-CM

## 2020-08-12 MED ORDER — CYANOCOBALAMIN 1000 MCG/ML IJ SOLN
1000.0000 ug | Freq: Once | INTRAMUSCULAR | Status: AC
  Administered 2020-08-12: 1000 ug via INTRAMUSCULAR

## 2020-08-12 NOTE — Progress Notes (Signed)
Patient presented for B 12 injection to left deltoid, patient voiced no concerns nor showed any signs of distress during injection. 

## 2020-08-15 DIAGNOSIS — M810 Age-related osteoporosis without current pathological fracture: Secondary | ICD-10-CM | POA: Diagnosis not present

## 2020-08-15 DIAGNOSIS — E559 Vitamin D deficiency, unspecified: Secondary | ICD-10-CM | POA: Diagnosis not present

## 2020-08-19 DIAGNOSIS — M810 Age-related osteoporosis without current pathological fracture: Secondary | ICD-10-CM | POA: Diagnosis not present

## 2020-08-19 DIAGNOSIS — E559 Vitamin D deficiency, unspecified: Secondary | ICD-10-CM | POA: Diagnosis not present

## 2020-08-25 ENCOUNTER — Ambulatory Visit: Payer: Medicare PPO

## 2020-08-30 ENCOUNTER — Ambulatory Visit (INDEPENDENT_AMBULATORY_CARE_PROVIDER_SITE_OTHER): Payer: Medicare PPO

## 2020-08-30 ENCOUNTER — Other Ambulatory Visit: Payer: Self-pay

## 2020-08-30 VITALS — BP 124/74 | HR 60 | Temp 97.1°F | Resp 14 | Ht 66.0 in | Wt 155.4 lb

## 2020-08-30 DIAGNOSIS — Z0000 Encounter for general adult medical examination without abnormal findings: Secondary | ICD-10-CM | POA: Diagnosis not present

## 2020-08-30 NOTE — Patient Instructions (Addendum)
Ms. Claudia Little , Thank you for taking time to come for your Medicare Wellness Visit. I appreciate your ongoing commitment to your health goals. Please review the following plan we discussed and let me know if I can assist you in the future.   These are the goals we discussed: Goals      Patient Stated   .  Exercise 150 minutes per week (moderate activity) (pt-stated)      She plans to increase her physical activity when possible.  Walk for exercise.  Weight goal 145lb       This is a list of the screening recommended for you and due dates:  Health Maintenance  Topic Date Due  . Flu Shot  10/31/2020  . Tetanus Vaccine  03/22/2024  . Colon Cancer Screening  05/28/2027  . DEXA scan (bone density measurement)  Completed  . COVID-19 Vaccine  Completed  . Hepatitis C Screening: USPSTF Recommendation to screen - Ages 49-79 yo.  Completed  . Pneumonia vaccines  Completed  . Zoster (Shingles) Vaccine  Completed  . HPV Vaccine  Aged Out   Advanced directives: End of life planning; Advance aging; Advanced directives discussed.  Copy of current HCPOA/Living Will requested.    Conditions/risks identified: none new  Follow up in one year for your annual wellness visit    Preventive Care 65 Years and Older, Female Preventive care refers to lifestyle choices and visits with your health care provider that can promote health and wellness. What does preventive care include?  A yearly physical exam. This is also called an annual well check.  Dental exams once or twice a year.  Routine eye exams. Ask your health care provider how often you should have your eyes checked.  Personal lifestyle choices, including:  Daily care of your teeth and gums.  Regular physical activity.  Eating a healthy diet.  Avoiding tobacco and drug use.  Limiting alcohol use.  Practicing safe sex.  Taking low-dose aspirin every day.  Taking vitamin and mineral supplements as recommended by your health care  provider. What happens during an annual well check? The services and screenings done by your health care provider during your annual well check will depend on your age, overall health, lifestyle risk factors, and family history of disease. Counseling  Your health care provider may ask you questions about your:  Alcohol use.  Tobacco use.  Drug use.  Emotional well-being.  Home and relationship well-being.  Sexual activity.  Eating habits.  History of falls.  Memory and ability to understand (cognition).  Work and work Statistician.  Reproductive health. Screening  You may have the following tests or measurements:  Height, weight, and BMI.  Blood pressure.  Lipid and cholesterol levels. These may be checked every 5 years, or more frequently if you are over 49 years old.  Skin check.  Lung cancer screening. You may have this screening every year starting at age 89 if you have a 30-pack-year history of smoking and currently smoke or have quit within the past 15 years.  Fecal occult blood test (FOBT) of the stool. You may have this test every year starting at age 25.  Flexible sigmoidoscopy or colonoscopy. You may have a sigmoidoscopy every 5 years or a colonoscopy every 10 years starting at age 2.  Hepatitis C blood test.  Hepatitis B blood test.  Sexually transmitted disease (STD) testing.  Diabetes screening. This is done by checking your blood sugar (glucose) after you have not eaten for a  while (fasting). You may have this done every 1-3 years.  Bone density scan. This is done to screen for osteoporosis. You may have this done starting at age 93.  Mammogram. This may be done every 1-2 years. Talk to your health care provider about how often you should have regular mammograms. Talk with your health care provider about your test results, treatment options, and if necessary, the need for more tests. Vaccines  Your health care provider may recommend certain  vaccines, such as:  Influenza vaccine. This is recommended every year.  Tetanus, diphtheria, and acellular pertussis (Tdap, Td) vaccine. You may need a Td booster every 10 years.  Zoster vaccine. You may need this after age 23.  Pneumococcal 13-valent conjugate (PCV13) vaccine. One dose is recommended after age 55.  Pneumococcal polysaccharide (PPSV23) vaccine. One dose is recommended after age 66. Talk to your health care provider about which screenings and vaccines you need and how often you need them. This information is not intended to replace advice given to you by your health care provider. Make sure you discuss any questions you have with your health care provider. Document Released: 04/15/2015 Document Revised: 12/07/2015 Document Reviewed: 01/18/2015 Elsevier Interactive Patient Education  2017 [employer] Prevention in the Home Falls can cause injuries. They can happen to people of all ages. There are many things you can do to make your home safe and to help prevent falls. What can I do on the outside of my home?  Regularly fix the edges of walkways and driveways and fix any cracks.  Remove anything that might make you trip as you walk through a door, such as a raised step or threshold.  Trim any bushes or trees on the path to your home.  Use bright outdoor lighting.  Clear any walking paths of anything that might make someone trip, such as rocks or tools.  Regularly check to see if handrails are loose or broken. Make sure that both sides of any steps have handrails.  Any raised decks and porches should have guardrails on the edges.  Have any leaves, snow, or ice cleared regularly.  Use sand or salt on walking paths during winter.  Clean up any spills in your garage right away. This includes oil or grease spills. What can I do in the bathroom?  Use night lights.  Install grab bars by the toilet and in the tub and shower. Do not use towel bars as grab  bars.  Use non-skid mats or decals in the tub or shower.  If you need to sit down in the shower, use a plastic, non-slip stool.  Keep the floor dry. Clean up any water that spills on the floor as soon as it happens.  Remove soap buildup in the tub or shower regularly.  Attach bath mats securely with double-sided non-slip rug tape.  Do not have throw rugs and other things on the floor that can make you trip. What can I do in the bedroom?  Use night lights.  Make sure that you have a light by your bed that is easy to reach.  Do not use any sheets or blankets that are too big for your bed. They should not hang down onto the floor.  Have a firm chair that has side arms. You can use this for support while you get dressed.  Do not have throw rugs and other things on the floor that can make you trip. What can I do in the  kitchen?  Clean up any spills right away.  Avoid walking on wet floors.  Keep items that you use a lot in easy-to-reach places.  If you need to reach something above you, use a strong step stool that has a grab bar.  Keep electrical cords out of the way.  Do not use floor polish or wax that makes floors slippery. If you must use wax, use non-skid floor wax.  Do not have throw rugs and other things on the floor that can make you trip. What can I do with my stairs?  Do not leave any items on the stairs.  Make sure that there are handrails on both sides of the stairs and use them. Fix handrails that are broken or loose. Make sure that handrails are as long as the stairways.  Check any carpeting to make sure that it is firmly attached to the stairs. Fix any carpet that is loose or worn.  Avoid having throw rugs at the top or bottom of the stairs. If you do have throw rugs, attach them to the floor with carpet tape.  Make sure that you have a light switch at the top of the stairs and the bottom of the stairs. If you do not have them, ask someone to add them for  you. What else can I do to help prevent falls?  Wear shoes that:  Do not have high heels.  Have rubber bottoms.  Are comfortable and fit you well.  Are closed at the toe. Do not wear sandals.  If you use a stepladder:  Make sure that it is fully opened. Do not climb a closed stepladder.  Make sure that both sides of the stepladder are locked into place.  Ask someone to hold it for you, if possible.  Clearly mark and make sure that you can see:  Any grab bars or handrails.  First and last steps.  Where the edge of each step is.  Use tools that help you move around (mobility aids) if they are needed. These include:  Canes.  Walkers.  Scooters.  Crutches.  Turn on the lights when you go into a dark area. Replace any light bulbs as soon as they burn out.  Set up your furniture so you have a clear path. Avoid moving your furniture around.  If any of your floors are uneven, fix them.  If there are any pets around you, be aware of where they are.  Review your medicines with your doctor. Some medicines can make you feel dizzy. This can increase your chance of falling. Ask your doctor what other things that you can do to help prevent falls. This information is not intended to replace advice given to you by your health care provider. Make sure you discuss any questions you have with your health care provider. Document Released: 01/13/2009 Document Revised: 08/25/2015 Document Reviewed: 04/23/2014 Elsevier Interactive Patient Education  2017 [employer].

## 2020-08-30 NOTE — Progress Notes (Signed)
Subjective:   Claudia Little is a 75 y.o. female who presents for Medicare Annual (Subsequent) preventive examination.  Review of Systems    No ROS.  Medicare Wellness   Cardiac Risk Factors include: advanced age (>47men, >60 women)     Objective:    Today's Vitals   08/30/20 0951  BP: 124/74  Pulse: 60  Resp: 14  Temp: (!) 97.1 F (36.2 C)  TempSrc: Oral  SpO2: 99%  Weight: 155 lb 6.4 oz (70.5 kg)  Height: 5\' 6"  (1.676 m)   Body mass index is 25.08 kg/m.  Advanced Directives 08/30/2020 08/25/2019 06/04/2019 08/22/2018 05/01/2018 08/20/2017 05/27/2017  Does Patient Have a Medical Advance Directive? Yes Yes Yes Yes No Yes Yes  Type of Paramedic of [place];Living will [place];Living will Living will;Healthcare Power of [place];Living will [place];Living will  Does patient want to make changes to medical advance directive? No - Patient declined No - Patient declined No - Patient declined No - Patient declined - No - Patient declined -  Copy of [place] in Chart? No - copy requested No - copy requested No - copy requested No - copy requested - No - copy requested Yes  Would patient like information on creating a medical advance directive? - - - - No - Patient declined - -    Current Medications (verified) Outpatient Encounter Medications as of 08/30/2020  Medication Sig  . alendronate (FOSAMAX) 70 MG tablet Take 70 mg by mouth once a week.  Marland Kitchen atenolol (TENORMIN) 25 MG tablet TAKE ONE TABLET BY MOUTH EVERY DAY  . Cholecalciferol 1000 units capsule Take 1 capsule (1,000 Units total) by mouth daily. (Patient taking differently: Take 4,000 Units by mouth daily. )  . Coenzyme Q10 100 MG TABS Take 1 tablet (100 mg total) by mouth daily.  . hydrOXYzine (ATARAX/VISTARIL) 10 MG tablet Take 1 tablet (10 mg total) by mouth at bedtime as needed.  .  rosuvastatin (CRESTOR) 5 MG tablet TAKE 1 TABLET BY MOUTH DAILY   No facility-administered encounter medications on file as of 08/30/2020.    Allergies (verified) Oxycodone   History: Past Medical History:  Diagnosis Date  . Breast cancer ([place]) 12/2013   right breast cancer  . Chronic anxiety   . Dysrhythmia    SVT  . History of kidney stones   . Insomnia   . Nephrolithiasis   . Osteoporosis   . PAC (premature atrial contraction)   . Palpitations   . Personal history of radiation therapy 2015   Right breast  . SVT (supraventricular tachycardia) (HCC)    Past Surgical History:  Procedure Laterality Date  . BREAST CYST ASPIRATION Left 10+ yrs ago   neg  . BREAST EXCISIONAL BIOPSY Right 12/15/2013   DCIS  . BREAST SURGERY Right 12/15/2013   lumpectomy  . BUNIONECTOMY  early '70s  . COLONOSCOPY WITH PROPOFOL N/A 05/27/2017   Procedure: COLONOSCOPY WITH PROPOFOL;  Surgeon: Lollie Sails, MD;  Location: [facility] ENDOSCOPY;  Service: Endoscopy;  Laterality: N/A;  . DILATION AND CURETTAGE OF UTERUS  1973   after miscarriage  . LITHOTRIPSY     Family History  Problem Relation Age of Onset  . Aortic stenosis Mother   . Congestive Heart Failure Mother   . Aortic stenosis Father   . Coronary artery disease Father        Deceased from complications  of atherosclerotic CAD and Atherosclerosis  . Congestive Heart Failure Father   . Parkinsonism Brother   . Colon polyps Sister   . Arthritis Maternal Grandfather   . Early death Paternal Grandmother   . Heart block Brother   . Stroke Maternal Grandmother   . Kidney disease Paternal Grandfather   . Breast cancer Maternal Aunt 70  . Ovarian cancer Maternal Aunt    Social History   Socioeconomic History  . Marital status: Married    Spouse name: Richard  . Number of children: 1  . Years of education: HS  . Highest education level: Not on file  Occupational History  . Occupation: Retired  Tobacco Use  . Smoking status:  Never Smoker  . Smokeless tobacco: Former Network engineer and Sexual Activity  . Alcohol use: Yes    Alcohol/week: 7.0 standard drinks    Types: 7 Glasses of wine per week  . Drug use: No  . Sexual activity: Not Currently  Other Topics Concern  . Not on file  Social History Narrative  . Not on file   Social Determinants of Health   Financial Resource Strain: Low Risk   . Difficulty of Paying Living Expenses: Not hard at all  Food Insecurity: No Food Insecurity  . Worried About Charity fundraiser in the Last Year: Never true  . Ran Out of Food in the Last Year: Never true  Transportation Needs: No Transportation Needs  . Lack of Transportation (Medical): No  . Lack of Transportation (Non-Medical): No  Physical Activity: Insufficiently Active  . Days of Exercise per Week: 4 days  . Minutes of Exercise per Session: 20 min  Stress: No Stress Concern Present  . Feeling of Stress : Not at all  Social Connections: Unknown  . Frequency of Communication with Friends and Family: Not on file  . Frequency of Social Gatherings with Friends and Family: Not on file  . Attends Religious Services: Not on file  . Active Member of Clubs or Organizations: Not on file  . Attends Archivist Meetings: Not on file  . Marital Status: Married    Tobacco Counseling Counseling given: Not Answered   Clinical Intake:  Pre-visit preparation completed: Yes        Diabetes: No  How often do you need to have someone help you when you read instructions, pamphlets, or other written materials from your doctor or pharmacy?: 1 - Never    Interpreter Needed?: No      Activities of Daily Living In your present state of health, do you have any difficulty performing the following activities: 08/30/2020  Hearing? N  Vision? N  Difficulty concentrating or making decisions? N  Walking or climbing stairs? N  Dressing or bathing? N  Doing errands, shopping? N  Preparing Food and eating  ? N  Using the Toilet? N  In the past six months, have you accidently leaked urine? N  Do you have problems with loss of bowel control? N  Managing your Medications? N  Managing your Finances? N  Housekeeping or managing your Housekeeping? N  Some recent data might be hidden    Patient Care Team: Einar Pheasant, MD as PCP - General (Internal Medicine)  Indicate any recent Medical Services you may have received from other than [facility] providers in the past year (date may be approximate).     Assessment:   This is a routine wellness examination for Mahalie.  Hearing/Vision screen  Hearing Screening  125Hz  250Hz  500Hz  1000Hz  2000Hz  3000Hz  4000Hz  6000Hz  8000Hz   Right ear:           Left ear:           Comments: Patient is able to hear conversational tones without difficulty.  No issues reported.  Vision Screening Comments: Visual acuity not assessed, virtual visit. They have seen their ophthalmologist.   Dietary issues and exercise activities discussed: Current Exercise Habits: Home exercise routine, Time (Minutes): 20, Frequency (Times/Week): 4, Weekly Exercise (Minutes/Week): 80, Intensity: Mild  Healthy diet Good water intake  Goals Addressed              This Visit's Progress     Patient Stated   .  COMPLETED: DIET -LOW CHOLESTEROL DIET (pt-stated)      .  Exercise 150 minutes per week (moderate activity) (pt-stated)        She plans to increase her physical activity when possible.  Walk for exercise.  Weight goal 145lb      Depression Screen PHQ 2/9 Scores 08/30/2020 08/25/2019 08/22/2018 08/22/2018 08/20/2017 04/17/2017 05/22/2016  PHQ - 2 Score 0 0 0 0 0 0 0  PHQ- 9 Score - - 0 - - - -    Fall Risk Fall Risk  08/30/2020 08/25/2019 08/22/2018 08/22/2018 08/20/2017  Falls in the past year? 0 0 0 0 No  Number falls in past yr: 0 - 0 - -  Injury with Fall? 0 - 0 - -  Follow up Falls evaluation completed Falls evaluation completed Falls evaluation completed - -    FALL  RISK PREVENTION PERTAINING TO THE HOME: Handrails in use when climbing stairs? Yes Home free of loose throw rugs in walkways, pet beds, electrical cords, etc? Yes  Adequate lighting in your home to reduce risk of falls? Yes   ASSISTIVE DEVICES UTILIZED TO PREVENT FALLS: Life alert? No  Use of a cane, walker or w/c? No   TIMED UP AND GO: Was the test performed? Yes .  Length of time to ambulate 10 feet: 10 sec.   Gait steady and fast without use of assistive device  Cognitive Function: Patient is alert and oriented x3.  Denies difficulty focusing, making decisions, memory loss.  Enjoys playing bridge card game and other brain health activities. MMSE/6CIT deferred. Normal by direct communication/observation.    MMSE - Mini Mental State Exam 08/20/2017  Orientation to time 5  Orientation to Place 5  Registration 3  Attention/ Calculation 5  Recall 3  Language- name 2 objects 2  Language- repeat 1  Language- follow 3 step command 3  Language- read & follow direction 1  Write a sentence 1  Copy design 1  Total score 30     6CIT Screen 08/22/2018  What Year? 0 points  What month? 0 points  What time? 0 points  Count back from 20 0 points  Months in reverse 0 points  Repeat phrase 0 points  Total Score 0    Immunizations Immunization History  Administered Date(s) Administered  . Influenza Split 02/14/2012  . Influenza, High Dose Seasonal PF 01/08/2019  . Influenza-Unspecified 01/24/2017, 12/03/2017, 12/28/2019  . PFIZER(Purple Top)SARS-COV-2 Vaccination 04/06/2019, 04/27/2019, 12/28/2019, 07/12/2020  . Pneumococcal Conjugate-13 03/22/2014, 04/10/2017  . Pneumococcal Polysaccharide-23 01/16/2011, 04/10/2017  . Tdap 03/22/2014  . Zoster Recombinat (Shingrix) 08/21/2017, 11/11/2017  . Zoster, Live 07/13/2010   Health Maintenance Health Maintenance  Topic Date Due  . INFLUENZA VACCINE  10/31/2020  . TETANUS/TDAP  03/22/2024  . COLONOSCOPY (Pts  45-66yrs Insurance  coverage will need to be confirmed)  05/28/2027  . DEXA SCAN  Completed  . COVID-19 Vaccine  Completed  . Hepatitis C Screening  Completed  . PNA vac Low Risk Adult  Completed  . Zoster Vaccines- Shingrix  Completed  . HPV VACCINES  Aged Out   Colorectal cancer screening: Type of screening: Colonoscopy. Completed 05/27/17. Repeat every 10 years  Mammogram status: Completed 12/18/19. Repeat every year  Lung Cancer Screening: (Low Dose CT Chest recommended if Age 39-80 years, 30 pack-year currently smoking OR have quit w/in 15years.) does not qualify.   Vision Screening: Recommended annual ophthalmology exams for early detection of glaucoma and other disorders of the eye. Is the patient up to date with their annual eye exam?  Yes   Dental Screening: Recommended annual dental exams for proper oral hygiene  Community Resource Referral / Chronic Care Management: CRR required this visit?  No   CCM required this visit?  No      Plan:    Keep all routine maintenance appointments.   I have personally reviewed and noted the following in the patient's chart:   . Medical and social history . Use of alcohol, tobacco or illicit drugs  . Current medications and supplements including opioid prescriptions.  . Functional ability and status . Nutritional status . Physical activity . Advanced directives . List of other physicians . Hospitalizations, surgeries, and ER visits in previous 12 months . Vitals . Screenings to include cognitive, depression, and falls . Referrals and appointments  In addition, I have reviewed and discussed with patient certain preventive protocols, quality metrics, and best practice recommendations. A written personalized care plan for preventive services as well as general preventive health recommendations were provided to patient.     Varney Biles, LPN   [id number]

## 2020-09-13 ENCOUNTER — Other Ambulatory Visit (INDEPENDENT_AMBULATORY_CARE_PROVIDER_SITE_OTHER): Payer: Medicare PPO

## 2020-09-13 ENCOUNTER — Other Ambulatory Visit: Payer: Self-pay

## 2020-09-13 DIAGNOSIS — E7800 Pure hypercholesterolemia, unspecified: Secondary | ICD-10-CM

## 2020-09-13 LAB — HEPATIC FUNCTION PANEL
ALT: 19 U/L (ref 0–35)
AST: 18 U/L (ref 0–37)
Albumin: 4.7 g/dL (ref 3.5–5.2)
Alkaline Phosphatase: 57 U/L (ref 39–117)
Bilirubin, Direct: 0.1 mg/dL (ref 0.0–0.3)
Total Bilirubin: 0.9 mg/dL (ref 0.2–1.2)
Total Protein: 7.4 g/dL (ref 6.0–8.3)

## 2020-09-13 LAB — BASIC METABOLIC PANEL
BUN: 19 mg/dL (ref 6–23)
CO2: 29 mEq/L (ref 19–32)
Calcium: 9.2 mg/dL (ref 8.4–10.5)
Chloride: 102 mEq/L (ref 96–112)
Creatinine, Ser: 1 mg/dL (ref 0.40–1.20)
GFR: 54.92 mL/min — ABNORMAL LOW (ref >60.00)
Glucose, Bld: 93 mg/dL (ref 70–99)
Potassium: 4.1 mEq/L (ref 3.5–5.1)
Sodium: 140 mEq/L (ref 135–145)

## 2020-09-13 LAB — LIPID PANEL
Cholesterol: 169 mg/dL (ref 0–200)
HDL: 53.2 mg/dL (ref >39.00)
NonHDL: 115.98
Total CHOL/HDL Ratio: 3
Triglycerides: 204 mg/dL — ABNORMAL HIGH (ref 0.0–149.0)
VLDL: 40.8 mg/dL — ABNORMAL HIGH (ref 0.0–40.0)

## 2020-09-13 LAB — LDL CHOLESTEROL, DIRECT: Direct LDL: 96 mg/dL

## 2020-09-15 ENCOUNTER — Encounter: Payer: Self-pay | Admitting: Internal Medicine

## 2020-09-15 ENCOUNTER — Other Ambulatory Visit: Payer: Self-pay

## 2020-09-15 ENCOUNTER — Ambulatory Visit: Payer: Medicare PPO | Admitting: Internal Medicine

## 2020-09-15 DIAGNOSIS — E7800 Pure hypercholesterolemia, unspecified: Secondary | ICD-10-CM

## 2020-09-15 DIAGNOSIS — M7989 Other specified soft tissue disorders: Secondary | ICD-10-CM | POA: Diagnosis not present

## 2020-09-15 DIAGNOSIS — N1831 Chronic kidney disease, stage 3a: Secondary | ICD-10-CM

## 2020-09-15 DIAGNOSIS — Z853 Personal history of malignant neoplasm of breast: Secondary | ICD-10-CM | POA: Diagnosis not present

## 2020-09-15 DIAGNOSIS — E538 Deficiency of other specified B group vitamins: Secondary | ICD-10-CM

## 2020-09-15 DIAGNOSIS — R631 Polydipsia: Secondary | ICD-10-CM

## 2020-09-15 DIAGNOSIS — G4700 Insomnia, unspecified: Secondary | ICD-10-CM | POA: Diagnosis not present

## 2020-09-15 DIAGNOSIS — F419 Anxiety disorder, unspecified: Secondary | ICD-10-CM | POA: Diagnosis not present

## 2020-09-15 DIAGNOSIS — I471 Supraventricular tachycardia: Secondary | ICD-10-CM | POA: Diagnosis not present

## 2020-09-15 MED ORDER — BUSPIRONE HCL 5 MG PO TABS: 5.0000 mg | ORAL_TABLET | Freq: Every day | ORAL | 1 refills | Status: DC

## 2020-09-15 NOTE — Progress Notes (Addendum)
Patient ID: Claudia Little, female   DOB: 1945-10-09, 75 y.o.   MRN: [id number]   Subjective:    Patient ID: Claudia Little, female    DOB: [birth year], 75 y.o.   MRN: [id number]  HPI This visit occurred during the SARS-CoV-2 public health emergency.  Safety protocols were in place, including screening questions prior to the visit, additional usage of staff PPE, and extensive cleaning of exam room while observing appropriate contact time as indicated for disinfecting solutions.   Patient here for a scheduled follow up.  Here to follow up regarding her persistent sleep issues.  Has had problems sleeping.  States she reads before going to bed.  Will wake up around 2:00.  Gets up at 7 usually in the morning.  Does not usually nap during the day.  Has tried hydroxyzine and trazodone.  Still persistent problems sleeping.  Tries to stay active.  No chest pain reported.  Breathing stable.  No increased cough or congestion.  No increased abdominal pain reported.  No bowel issues reported.  No sick contacts.  No fever.  No nausea or vomiting.  Report increased thirst.  Notices mostly at night.  Past Medical History:  Diagnosis Date   Breast cancer ([place]) 12/2013   right breast cancer   Chronic anxiety    Dysrhythmia    SVT   History of kidney stones    Insomnia    Nephrolithiasis    Osteoporosis    PAC (premature atrial contraction)    Palpitations    Personal history of radiation therapy 2015   Right breast   SVT (supraventricular tachycardia) (HCC)    Past Surgical History:  Procedure Laterality Date   BREAST CYST ASPIRATION Left 10+ yrs ago   neg   BREAST EXCISIONAL BIOPSY Right 12/15/2013   DCIS   BREAST SURGERY Right 12/15/2013   lumpectomy   BUNIONECTOMY  early '70s   COLONOSCOPY WITH PROPOFOL N/A 05/27/2017   Procedure: COLONOSCOPY WITH PROPOFOL;  Surgeon: Lollie Sails, MD;  Location[facility] ENDOSCOPY;  Service: Endoscopy;  Laterality: N/A;   DILATION AND CURETTAGE OF UTERUS  1973    after miscarriage   LITHOTRIPSY     Family History  Problem Relation Age of Onset   Aortic stenosis Mother    Congestive Heart Failure Mother    Aortic stenosis Father    Coronary artery disease Father        Deceased from complications of atherosclerotic CAD and Atherosclerosis   Congestive Heart Failure Father    Parkinsonism Brother    Colon polyps Sister    Arthritis Maternal Grandfather    Early death Paternal Grandmother    Heart block Brother    Stroke Maternal Grandmother    Kidney disease Paternal Grandfather    Breast cancer Maternal Aunt 70   Ovarian cancer Maternal Aunt    Social History   Socioeconomic History   Marital status: Married    Spouse name: Richard   Number of children: 1   Years of education: HS   Highest education level: Not on file  Occupational History   Occupation: Retired  Tobacco Use   Smoking status: Never   Smokeless tobacco: Former  Substance and Sexual Activity   Alcohol use: Yes    Alcohol/week: 7.0 standard drinks    Types: 7 Glasses of wine per week   Drug use: No   Sexual activity: Not Currently  Other Topics Concern   Not on file  Social History Narrative  Not on file   Social Determinants of Health   Financial Resource Strain: Low Risk    Difficulty of Paying Living Expenses: Not hard at all  Food Insecurity: No Food Insecurity   Worried About Charity fundraiser in the Last Year: Never true   [place] in the Last Year: Never true  Transportation Needs: No Transportation Needs   Lack of Transportation (Medical): No   Lack of Transportation (Non-Medical): No  Physical Activity: Insufficiently Active   Days of Exercise per Week: 4 days   Minutes of Exercise per Session: 20 min  Stress: No Stress Concern Present   Feeling of Stress : Not at all  Social Connections: Unknown   Frequency of Communication with Friends and Family: Not on file   Frequency of Social Gatherings with Friends and Family: Not on file    Attends Religious Services: Not on file   Active Member of Clubs or Organizations: Not on file   Attends Archivist Meetings: Not on file   Marital Status: Married    Outpatient Encounter Medications as of 09/15/2020  Medication Sig   busPIRone (BUSPAR) 5 MG tablet Take 1 tablet (5 mg total) by mouth at bedtime.   alendronate (FOSAMAX) 70 MG tablet Take 70 mg by mouth once a week.   atenolol (TENORMIN) 25 MG tablet TAKE ONE TABLET BY MOUTH EVERY DAY   Cholecalciferol 25 MCG (1000 UT) tablet Take 4,000 Units by mouth daily.   Coenzyme Q10 100 MG TABS Take 1 tablet (100 mg total) by mouth daily.   rosuvastatin (CRESTOR) 5 MG tablet TAKE 1 TABLET BY MOUTH DAILY   [DISCONTINUED] Cholecalciferol 1000 units capsule Take 1 capsule (1,000 Units total) by mouth daily. (Patient taking differently: Take 4,000 Units by mouth daily. )   [DISCONTINUED] hydrOXYzine (ATARAX/VISTARIL) 10 MG tablet Take 1 tablet (10 mg total) by mouth at bedtime as needed.   No facility-administered encounter medications on file as of 09/15/2020.     Review of Systems  Constitutional:  Negative for appetite change and unexpected weight change.  HENT:  Negative for congestion and sinus pressure.   Respiratory:  Negative for cough, chest tightness and shortness of breath.   Cardiovascular:  Negative for chest pain, palpitations and leg swelling.  Gastrointestinal:  Negative for abdominal pain, diarrhea, nausea and vomiting.  Genitourinary:  Negative for difficulty urinating and dysuria.  Musculoskeletal:  Negative for joint swelling and myalgias.  Skin:  Negative for color change and rash.  Neurological:  Negative for dizziness, light-headedness and headaches.  Psychiatric/Behavioral:  Positive for sleep disturbance. Negative for agitation and dysphoric mood.       Objective:    Physical Exam Vitals reviewed.  Constitutional:      General: She is not in acute distress.    Appearance: Normal appearance.   HENT:     Head: Normocephalic and atraumatic.     Right Ear: External ear normal.     Left Ear: External ear normal.  Eyes:     General: No scleral icterus.       Right eye: No discharge.        Left eye: No discharge.     Conjunctiva/sclera: Conjunctivae normal.  Neck:     Thyroid: No thyromegaly.  Cardiovascular:     Rate and Rhythm: Normal rate and regular rhythm.  Pulmonary:     Effort: No respiratory distress.     Breath sounds: Normal breath sounds. No wheezing.  Abdominal:  General: Bowel sounds are normal.     Palpations: Abdomen is soft.     Tenderness: There is no abdominal tenderness.  Musculoskeletal:        General: No swelling or tenderness.     Cervical back: Neck supple. No tenderness.  Lymphadenopathy:     Cervical: No cervical adenopathy.  Skin:    Findings: No erythema or rash.  Neurological:     Mental Status: She is alert.  Psychiatric:        Mood and Affect: Mood normal.        Behavior: Behavior normal.    BP 122/64   Pulse 67   Temp (!) 96.7 F (35.9 C)   Resp 16   Ht 5\' 6"  (1.676 m)   Wt 153 lb 6.4 oz (69.6 kg)   SpO2 99%   BMI 24.76 kg/m  Wt Readings from Last 3 Encounters:  09/15/20 153 lb 6.4 oz (69.6 kg)  08/30/20 155 lb 6.4 oz (70.5 kg)  05/18/20 153 lb (69.4 kg)     Lab Results  Component Value Date   WBC 6.7 01/11/2020   HGB 13.4 01/11/2020   HCT 39.1 01/11/2020   PLT 244.0 01/11/2020   GLUCOSE 93 09/13/2020   CHOL 169 09/13/2020   TRIG 204.0 (H) 09/13/2020   HDL 53.20 09/13/2020   LDLDIRECT 96.0 09/13/2020   LDLCALC 82 05/16/2020   ALT 19 09/13/2020   AST 18 09/13/2020   NA 140 09/13/2020   K 4.1 09/13/2020   CL 102 09/13/2020   CREATININE 1.00 09/13/2020   BUN 19 09/13/2020   CO2 29 09/13/2020   TSH 2.15 01/11/2020   HGBA1C 5.3 01/09/2019    MM 3D SCREEN BREAST BILATERAL  Result Date: 12/18/2019 CLINICAL DATA:  Screening. EXAM: DIGITAL SCREENING BILATERAL MAMMOGRAM WITH TOMO AND CAD COMPARISON:   Previous exam(s). ACR Breast Density Category c: The breast tissue is heterogeneously dense, which may obscure small masses. FINDINGS: There are no findings suspicious for malignancy. Images were processed with CAD. IMPRESSION: No mammographic evidence of malignancy. A result letter of this screening mammogram will be mailed directly to the patient. RECOMMENDATION: Screening mammogram in one year. (Code:SM-B-01Y) BI-RADS CATEGORY  1: Negative. Electronically Signed   By: Nolon Nations M.D.   On: 12/18/2019 08:55       Assessment & Plan:   Problem List Items Addressed This Visit     Anxiety    Discussed increased anxiety.  Discussed sleeping issues.  Trial of BuSpar as outlined.  Follow-up.       Relevant Medications   busPIRone (BUSPAR) 5 MG tablet   B12 deficiency    Continue B12 injections.       CKD (chronic kidney disease) stage 3, GFR 30-59 ml/min (HCC)    Avoid antiinflammatories.  Stay hydrated.  Follow metabolic panel.        History of breast cancer    Mammogram 12/18/2019 BI-RADS 1.       Hypercholesteremia    Tolerating Crestor.  Continue low-cholesterol diet and exercise.  Follow-up lipid panel liver function test.       Increased thirst    Increased thirst.  States occurs a couple times a week.  Discussed possible side effect of hydroxyzine.  She plans to stop the medication.  Will notify me if her persistent issue.        Insomnia    Has had persistent difficulty sleeping.  Has tried hydroxyzine and trazodone.  Still with issues.  Discussed  increased stress and anxiety.  Trial of BuSpar as directed.  Follow-up.       Mass of soft tissue of neck    Saw ENT.  Not palpable on exam.  No further work-up warranted.  Follow-up.       Supraventricular tachycardia ([place])    Has done well on atenolol.  Continue current dose.  Follow.  She feels is stable.          Einar Pheasant, MD

## 2020-09-25 ENCOUNTER — Encounter: Payer: Self-pay | Admitting: Internal Medicine

## 2020-09-25 DIAGNOSIS — N183 Chronic kidney disease, stage 3 unspecified: Secondary | ICD-10-CM | POA: Insufficient documentation

## 2020-09-25 DIAGNOSIS — R631 Polydipsia: Secondary | ICD-10-CM | POA: Insufficient documentation

## 2020-09-25 NOTE — Assessment & Plan Note (Signed)
Saw ENT.  Not palpable on exam.  No further work-up warranted.  Follow-up.

## 2020-09-25 NOTE — Assessment & Plan Note (Signed)
Discussed increased anxiety.  Discussed sleeping issues.  Trial of BuSpar as outlined.  Follow-up.

## 2020-09-25 NOTE — Assessment & Plan Note (Signed)
Has done well on atenolol.  Continue current dose.  Follow.  She feels is stable.  

## 2020-09-25 NOTE — Assessment & Plan Note (Signed)
Avoid antiinflammatories.  Stay hydrated.  Follow metabolic panel.   

## 2020-09-25 NOTE — Assessment & Plan Note (Signed)
Tolerating Crestor.  Continue low-cholesterol diet and exercise.  Follow-up lipid panel liver function test.

## 2020-09-25 NOTE — Assessment & Plan Note (Signed)
Increased thirst.  States occurs a couple times a week.  Discussed possible side effect of hydroxyzine.  She plans to stop the medication.  Will notify me if her persistent issue.

## 2020-09-25 NOTE — Assessment & Plan Note (Signed)
Mammogram 12/18/2019 BI-RADS 1.

## 2020-09-25 NOTE — Assessment & Plan Note (Signed)
Has had persistent difficulty sleeping.  Has tried hydroxyzine and trazodone.  Still with issues.  Discussed increased stress and anxiety.  Trial of BuSpar as directed.  Follow-up.

## 2020-09-25 NOTE — Assessment & Plan Note (Signed)
Continue B12 injections.   

## 2020-10-07 ENCOUNTER — Other Ambulatory Visit: Payer: Self-pay | Admitting: Internal Medicine

## 2020-10-07 DIAGNOSIS — E7800 Pure hypercholesterolemia, unspecified: Secondary | ICD-10-CM

## 2020-10-14 DIAGNOSIS — L738 Other specified follicular disorders: Secondary | ICD-10-CM | POA: Diagnosis not present

## 2020-10-14 DIAGNOSIS — D225 Melanocytic nevi of trunk: Secondary | ICD-10-CM | POA: Diagnosis not present

## 2020-10-14 DIAGNOSIS — L720 Epidermal cyst: Secondary | ICD-10-CM | POA: Diagnosis not present

## 2020-10-14 DIAGNOSIS — D2272 Melanocytic nevi of left lower limb, including hip: Secondary | ICD-10-CM | POA: Diagnosis not present

## 2020-10-14 DIAGNOSIS — Z85828 Personal history of other malignant neoplasm of skin: Secondary | ICD-10-CM | POA: Diagnosis not present

## 2020-10-17 ENCOUNTER — Telehealth: Payer: Self-pay | Admitting: Internal Medicine

## 2020-10-17 ENCOUNTER — Other Ambulatory Visit: Payer: Self-pay

## 2020-10-17 ENCOUNTER — Ambulatory Visit: Payer: Medicare PPO

## 2020-10-17 ENCOUNTER — Ambulatory Visit (INDEPENDENT_AMBULATORY_CARE_PROVIDER_SITE_OTHER): Payer: Medicare PPO

## 2020-10-17 DIAGNOSIS — E538 Deficiency of other specified B group vitamins: Secondary | ICD-10-CM | POA: Diagnosis not present

## 2020-10-17 MED ORDER — CYANOCOBALAMIN 1000 MCG/ML IJ SOLN
1000.0000 ug | Freq: Once | INTRAMUSCULAR | Status: AC
  Administered 2020-10-17: 1000 ug via INTRAMUSCULAR

## 2020-10-17 NOTE — Telephone Encounter (Signed)
PT was seen today and wanted to know if before her next apt if she needed labs done.

## 2020-10-17 NOTE — Progress Notes (Addendum)
Patient presented for B 12 injection to left deltoid, patient voiced no concerns nor showed any signs of distress during injection. 

## 2020-10-19 NOTE — Telephone Encounter (Signed)
Patient scheduled for labs prior to her 6 month follow up. Will order labs

## 2020-10-20 ENCOUNTER — Other Ambulatory Visit: Payer: Self-pay | Admitting: Internal Medicine

## 2020-10-20 DIAGNOSIS — I471 Supraventricular tachycardia: Secondary | ICD-10-CM

## 2020-11-04 ENCOUNTER — Other Ambulatory Visit: Payer: Self-pay | Admitting: Internal Medicine

## 2020-11-04 DIAGNOSIS — Z1231 Encounter for screening mammogram for malignant neoplasm of breast: Secondary | ICD-10-CM

## 2020-11-07 ENCOUNTER — Telehealth: Payer: Self-pay | Admitting: Internal Medicine

## 2020-11-07 ENCOUNTER — Telehealth (INDEPENDENT_AMBULATORY_CARE_PROVIDER_SITE_OTHER): Payer: Medicare PPO | Admitting: Family Medicine

## 2020-11-07 ENCOUNTER — Other Ambulatory Visit: Payer: Self-pay

## 2020-11-07 DIAGNOSIS — R197 Diarrhea, unspecified: Secondary | ICD-10-CM

## 2020-11-07 NOTE — Progress Notes (Signed)
MyChart Video Visit    Virtual Visit via Video Note   This visit type was conducted due to national recommendations for restrictions regarding the COVID-19 Pandemic (e.g. social distancing) in an effort to limit this patient's exposure and mitigate transmission in our community. This patient is at least at moderate risk for complications without adequate follow up. This format is felt to be most appropriate for this patient at this time. Physical exam was limited by quality of the video and audio technology used for the visit. Claudia Little was able to get the patient set up on a video visit.  Patient location: Home Patient and provider in visit Provider location: Office  I discussed the limitations of evaluation and management by telemedicine and the availability of in person appointments. The patient expressed understanding and agreed to proceed.  Visit Date: 11/07/2020  Today's healthcare provider: Ann Held, DO      Subjective:    Patient ID: Claudia Little, female    DOB: [birth year], 75 y.o.   MRN: [id number]  Chief Complaint  Patient presents with   Diarrhea    Complains of diarrhea that started about 10 days ago, better today    Diarrhea  Pertinent negatives include no abdominal pain, chills, coughing, fever, headaches, myalgias or vomiting.  Patient is in today for a video visit.  She reports she has been experiencing diarrhea for the past 9 days. She mentions that she is very sensitive to food and often has an upset stomach.  9 days ago when the diarrhea started, she had just come back from lunch where she had a salad with salmon and a creamy dressing. She has been going to the bathroom several times a day. She did not take any medication to resolve her symptoms. She has been drinking a lot of fluids and some electrolytes.  Today, she mentions she has only gone to the toilet once in the morning. She also eats a lot of fruit and this afternoon, had a peach with  some blueberries and almonds.  Past Medical History:  Diagnosis Date   Breast cancer ([place]) 12/2013   right breast cancer   Chronic anxiety    Dysrhythmia    SVT   History of kidney stones    Insomnia    Nephrolithiasis    Osteoporosis    PAC (premature atrial contraction)    Palpitations    Personal history of radiation therapy 2015   Right breast   SVT (supraventricular tachycardia) (HCC)     Past Surgical History:  Procedure Laterality Date   BREAST CYST ASPIRATION Left 10+ yrs ago   neg   BREAST EXCISIONAL BIOPSY Right 12/15/2013   DCIS   BREAST SURGERY Right 12/15/2013   lumpectomy   BUNIONECTOMY  early '70s   COLONOSCOPY WITH PROPOFOL N/A 05/27/2017   Procedure: COLONOSCOPY WITH PROPOFOL;  Surgeon: Lollie Sails, MD;  Location: [facility] ENDOSCOPY;  Service: Endoscopy;  Laterality: N/A;   DILATION AND CURETTAGE OF UTERUS  1973   after miscarriage   LITHOTRIPSY      Family History  Problem Relation Age of Onset   Aortic stenosis Mother    Congestive Heart Failure Mother    Aortic stenosis Father    Coronary artery disease Father        Deceased from complications of atherosclerotic CAD and Atherosclerosis   Congestive Heart Failure Father    Parkinsonism Brother    Colon polyps Sister    Arthritis Maternal Grandfather  Early death Paternal Grandmother    Heart block Brother    Stroke Maternal Grandmother    Kidney disease Paternal Grandfather    Breast cancer Maternal Aunt 70   Ovarian cancer Maternal Aunt     Social History   Socioeconomic History   Marital status: Married    Spouse name: Richard   Number of children: 1   Years of education: HS   Highest education level: Not on file  Occupational History   Occupation: Retired  Tobacco Use   Smoking status: Never   Smokeless tobacco: Former  Substance and Sexual Activity   Alcohol use: Yes    Alcohol/week: 7.0 standard drinks    Types: 7 Glasses of wine per week   Drug use: No   Sexual  activity: Not Currently  Other Topics Concern   Not on file  Social History Narrative   Not on file   Social Determinants of Health   Financial Resource Strain: Low Risk    Difficulty of Paying Living Expenses: Not hard at all  Food Insecurity: No Food Insecurity   Worried About Charity fundraiser in the Last Year: Never true   [place] in the Last Year: Never true  Transportation Needs: No Transportation Needs   Lack of Transportation (Medical): No   Lack of Transportation (Non-Medical): No  Physical Activity: Insufficiently Active   Days of Exercise per Week: 4 days   Minutes of Exercise per Session: 20 min  Stress: No Stress Concern Present   Feeling of Stress : Not at all  Social Connections: Unknown   Frequency of Communication with Friends and Family: Not on file   Frequency of Social Gatherings with Friends and Family: Not on file   Attends Religious Services: Not on Electrical engineer or Organizations: Not on file   Attends Archivist Meetings: Not on file   Marital Status: Married  Human resources officer Violence: Not At Risk   Fear of Current or Ex-Partner: No   Emotionally Abused: No   Physically Abused: No   Sexually Abused: No    Outpatient Medications Prior to Visit  Medication Sig Dispense Refill   alendronate (FOSAMAX) 70 MG tablet Take 70 mg by mouth once a week.     atenolol (TENORMIN) 25 MG tablet TAKE 1 TABLET BY MOUTH DAILY 90 tablet 1   busPIRone (BUSPAR) 5 MG tablet Take 1 tablet (5 mg total) by mouth at bedtime. 30 tablet 1   Cholecalciferol 25 MCG (1000 UT) tablet Take 4,000 Units by mouth daily.     Coenzyme Q10 100 MG TABS Take 1 tablet (100 mg total) by mouth daily. 30 tablet 0   rosuvastatin (CRESTOR) 5 MG tablet TAKE 1 TABLET BY MOUTH DAILY 90 tablet 1   No facility-administered medications prior to visit.    Allergies  Allergen Reactions   Oxycodone Nausea And Vomiting    Review of Systems  Constitutional:   Negative for chills, fever and malaise/fatigue.  HENT:  Negative for congestion and hearing loss.   Eyes:  Negative for discharge.  Respiratory:  Negative for cough, sputum production and shortness of breath.   Cardiovascular:  Negative for chest pain, palpitations and leg swelling.  Gastrointestinal:  Positive for diarrhea. Negative for abdominal pain, blood in stool, constipation, heartburn, nausea and vomiting.  Genitourinary:  Negative for dysuria, frequency, hematuria and urgency.  Musculoskeletal:  Negative for back pain, falls and myalgias.  Skin:  Negative for  rash.  Neurological:  Negative for dizziness, sensory change, loss of consciousness, weakness and headaches.  Endo/Heme/Allergies:  Negative for environmental allergies. Does not bruise/bleed easily.  Psychiatric/Behavioral:  Negative for depression and suicidal ideas. The patient is not nervous/anxious and does not have insomnia.       Objective:    Physical Exam Vitals and nursing note reviewed.  Constitutional:      Appearance: She is well-developed.  Neck:     Thyroid: No thyromegaly.     Vascular: No JVD.  Pulmonary:     Effort: Pulmonary effort is normal.  Neurological:     Mental Status: She is alert and oriented to person, place, and time.  Psychiatric:        Mood and Affect: Mood normal.    There were no vitals taken for this visit. Wt Readings from Last 3 Encounters:  09/15/20 153 lb 6.4 oz (69.6 kg)  08/30/20 155 lb 6.4 oz (70.5 kg)  05/18/20 153 lb (69.4 kg)    Diabetic Foot Exam - Simple   No data filed    Lab Results  Component Value Date   WBC 6.7 01/11/2020   HGB 13.4 01/11/2020   HCT 39.1 01/11/2020   PLT 244.0 01/11/2020   GLUCOSE 93 09/13/2020   CHOL 169 09/13/2020   TRIG 204.0 (H) 09/13/2020   HDL 53.20 09/13/2020   LDLDIRECT 96.0 09/13/2020   LDLCALC 82 05/16/2020   ALT 19 09/13/2020   AST 18 09/13/2020   NA 140 09/13/2020   K 4.1 09/13/2020   CL 102 09/13/2020    CREATININE 1.00 09/13/2020   BUN 19 09/13/2020   CO2 29 09/13/2020   TSH 2.15 01/11/2020   HGBA1C 5.3 01/09/2019    Lab Results  Component Value Date   TSH 2.15 01/11/2020   Lab Results  Component Value Date   WBC 6.7 01/11/2020   HGB 13.4 01/11/2020   HCT 39.1 01/11/2020   MCV 94.6 01/11/2020   PLT 244.0 01/11/2020   Lab Results  Component Value Date   NA 140 09/13/2020   K 4.1 09/13/2020   CO2 29 09/13/2020   GLUCOSE 93 09/13/2020   BUN 19 09/13/2020   CREATININE 1.00 09/13/2020   BILITOT 0.9 09/13/2020   ALKPHOS 57 09/13/2020   AST 18 09/13/2020   ALT 19 09/13/2020   PROT 7.4 09/13/2020   ALBUMIN 4.7 09/13/2020   CALCIUM 9.2 09/13/2020   ANIONGAP 9 11/23/2015   GFR 54.92 (L) 09/13/2020   Lab Results  Component Value Date   CHOL 169 09/13/2020   Lab Results  Component Value Date   HDL 53.20 09/13/2020   Lab Results  Component Value Date   LDLCALC 82 05/16/2020   Lab Results  Component Value Date   TRIG 204.0 (H) 09/13/2020   Lab Results  Component Value Date   CHOLHDL 3 09/13/2020   Lab Results  Component Value Date   HGBA1C 5.3 01/09/2019       Assessment & Plan:   Problem List Items Addressed This Visit       Unprioritized   Diarrhea - Primary    Resolving on its own-- drink plenty of fluids  Inc fiber in diet ---  Metamucil, fibersure Probiotic F/u pcp if symptoms worsen/ persist        No orders of the defined types were placed in this encounter.   I discussed the assessment and treatment plan with the patient. The patient was provided an opportunity to ask questions  and all were answered. The patient agreed with the plan and demonstrated an understanding of the instructions.   The patient was advised to call back or seek an in-person evaluation if the symptoms worsen or if the condition fails to improve as anticipated.  I provided 20 minutes of face-to-face time during this encounter.   I,Zite Okoli,acting as a Education administrator for  [employer], DO.,have documented all relevant documentation on the behalf of Ann Held, DO,as directed by  Ann Held, DO while in the presence of [place], DO, have reviewed all documentation for this visit. The documentation on 11/08/20 for the exam, diagnosis, procedures, and orders are all accurate and complete.    Ann Held, DO [place] [facility] [employer] [phone] (phone) [phone] (fax)  [place]

## 2020-11-07 NOTE — Telephone Encounter (Signed)
Patient has appointment today

## 2020-11-07 NOTE — Telephone Encounter (Signed)
Patient informed, Due to the high volume of calls and your symptoms we have to forward your call to our Triage Nurse to expedient your call. Please hold for the transfer.  Patient transferred to Access Nurse. Due to having diarrhea for the past 10 days.No available openings in office or virtual.

## 2020-11-08 ENCOUNTER — Encounter: Payer: Self-pay | Admitting: Family Medicine

## 2020-11-08 NOTE — Telephone Encounter (Signed)
Per review, she was seen by Dr Etter Sjogren. Please confirm she is doing ok.

## 2020-11-08 NOTE — Assessment & Plan Note (Signed)
Resolving on its own-- drink plenty of fluids  Inc fiber in diet ---  Metamucil, fibersure Probiotic F/u pcp if symptoms worsen/ persist

## 2020-11-08 NOTE — Telephone Encounter (Signed)
Called patient to get update and check on her. Patient stated that she is doing ok and thanked me for calling to check on her

## 2020-11-18 ENCOUNTER — Ambulatory Visit: Payer: Medicare PPO | Admitting: Internal Medicine

## 2020-11-18 ENCOUNTER — Ambulatory Visit (INDEPENDENT_AMBULATORY_CARE_PROVIDER_SITE_OTHER): Payer: Medicare PPO

## 2020-11-18 ENCOUNTER — Other Ambulatory Visit: Payer: Self-pay

## 2020-11-18 DIAGNOSIS — E538 Deficiency of other specified B group vitamins: Secondary | ICD-10-CM

## 2020-11-18 MED ORDER — CYANOCOBALAMIN 1000 MCG/ML IJ SOLN
1000.0000 ug | Freq: Once | INTRAMUSCULAR | Status: AC
  Administered 2020-11-18: 1000 ug via INTRAMUSCULAR

## 2020-11-18 NOTE — Progress Notes (Addendum)
Patient presented for B 12 injection to left deltoid, patient voiced no concerns nor showed any signs of distress during injection. 

## 2020-11-29 ENCOUNTER — Encounter: Payer: Self-pay | Admitting: Internal Medicine

## 2020-11-29 DIAGNOSIS — G479 Sleep disorder, unspecified: Secondary | ICD-10-CM

## 2020-11-29 NOTE — Telephone Encounter (Signed)
We have seen her for this in the past and tried multiple medications. Do you want to refer her to some one?

## 2020-11-29 NOTE — Telephone Encounter (Signed)
Patient agreed to referral to Psychiatry.

## 2020-11-29 NOTE — Telephone Encounter (Signed)
Given we have tried multiple medications, would she be agreeable to see someone regarding her sleep issues.  I can refer her to psychiatry - to help with sleep, etc.

## 2020-11-29 NOTE — Telephone Encounter (Signed)
Order placed for psych referral.   

## 2020-12-16 ENCOUNTER — Ambulatory Visit
Admission: RE | Admit: 2020-12-16 | Discharge: 2020-12-16 | Disposition: A | Discharge status: Home / Self Care | Payer: Medicare PPO | Source: Ambulatory Visit | Attending: Internal Medicine | Admitting: Internal Medicine

## 2020-12-16 ENCOUNTER — Other Ambulatory Visit: Payer: Self-pay

## 2020-12-16 DIAGNOSIS — Z1231 Encounter for screening mammogram for malignant neoplasm of breast: Secondary | ICD-10-CM | POA: Diagnosis not present

## 2020-12-19 ENCOUNTER — Ambulatory Visit: Payer: Medicare PPO

## 2020-12-21 ENCOUNTER — Ambulatory Visit (INDEPENDENT_AMBULATORY_CARE_PROVIDER_SITE_OTHER): Payer: Medicare PPO

## 2020-12-21 ENCOUNTER — Other Ambulatory Visit: Payer: Self-pay

## 2020-12-21 DIAGNOSIS — E538 Deficiency of other specified B group vitamins: Secondary | ICD-10-CM

## 2020-12-21 MED ORDER — CYANOCOBALAMIN 1000 MCG/ML IJ SOLN
1000.0000 ug | Freq: Once | INTRAMUSCULAR | Status: AC
  Administered 2020-12-21: 1000 ug via INTRAMUSCULAR

## 2020-12-21 NOTE — Progress Notes (Signed)
Patient presented for B 12 injection to left deltoid, patient voiced no concerns nor showed any signs of distress during injection. 

## 2021-01-24 ENCOUNTER — Ambulatory Visit (INDEPENDENT_AMBULATORY_CARE_PROVIDER_SITE_OTHER): Payer: Medicare PPO

## 2021-01-24 DIAGNOSIS — E538 Deficiency of other specified B group vitamins: Secondary | ICD-10-CM | POA: Diagnosis not present

## 2021-01-24 MED ORDER — CYANOCOBALAMIN 1000 MCG/ML IJ SOLN
1000.0000 ug | Freq: Once | INTRAMUSCULAR | Status: AC
  Administered 2021-01-24: 1000 ug via INTRAMUSCULAR

## 2021-01-24 NOTE — Progress Notes (Signed)
Patient presented for B 12 injection to left deltoid, patient voiced no concerns nor showed any signs of distress during injection. 

## 2021-01-26 ENCOUNTER — Other Ambulatory Visit: Payer: Self-pay | Admitting: Internal Medicine

## 2021-01-26 DIAGNOSIS — E7800 Pure hypercholesterolemia, unspecified: Secondary | ICD-10-CM

## 2021-01-26 DIAGNOSIS — I471 Supraventricular tachycardia: Secondary | ICD-10-CM

## 2021-02-12 ENCOUNTER — Encounter: Payer: Self-pay | Admitting: Internal Medicine

## 2021-02-12 DIAGNOSIS — M8580 Other specified disorders of bone density and structure, unspecified site: Secondary | ICD-10-CM

## 2021-02-12 DIAGNOSIS — E7800 Pure hypercholesterolemia, unspecified: Secondary | ICD-10-CM

## 2021-02-12 DIAGNOSIS — E538 Deficiency of other specified B group vitamins: Secondary | ICD-10-CM

## 2021-02-12 DIAGNOSIS — N1831 Chronic kidney disease, stage 3a: Secondary | ICD-10-CM

## 2021-02-12 DIAGNOSIS — E559 Vitamin D deficiency, unspecified: Secondary | ICD-10-CM

## 2021-02-15 ENCOUNTER — Other Ambulatory Visit (INDEPENDENT_AMBULATORY_CARE_PROVIDER_SITE_OTHER): Payer: Medicare PPO

## 2021-02-15 ENCOUNTER — Other Ambulatory Visit: Payer: Self-pay

## 2021-02-15 DIAGNOSIS — E538 Deficiency of other specified B group vitamins: Secondary | ICD-10-CM | POA: Diagnosis not present

## 2021-02-15 DIAGNOSIS — E559 Vitamin D deficiency, unspecified: Secondary | ICD-10-CM | POA: Diagnosis not present

## 2021-02-15 DIAGNOSIS — N1831 Chronic kidney disease, stage 3a: Secondary | ICD-10-CM | POA: Diagnosis not present

## 2021-02-15 DIAGNOSIS — E7800 Pure hypercholesterolemia, unspecified: Secondary | ICD-10-CM

## 2021-02-15 LAB — LIPID PANEL
Cholesterol: 189 mg/dL (ref 0–200)
HDL: 54.5 mg/dL (ref >39.00)
LDL Cholesterol: 103 mg/dL — ABNORMAL HIGH (ref 0–99)
NonHDL: 134.25
Total CHOL/HDL Ratio: 3
Triglycerides: 158 mg/dL — ABNORMAL HIGH (ref 0.0–149.0)
VLDL: 31.6 mg/dL (ref 0.0–40.0)

## 2021-02-15 LAB — CBC WITH DIFFERENTIAL/PLATELET
Basophils Absolute: 0.1 10*3/uL (ref 0.0–0.1)
Basophils Relative: 0.9 % (ref 0.0–3.0)
Eosinophils Absolute: 0.5 10*3/uL (ref 0.0–0.7)
Eosinophils Relative: 7.5 % — ABNORMAL HIGH (ref 0.0–5.0)
HCT: 40.3 % (ref 36.0–46.0)
Hemoglobin: 13.5 g/dL (ref 12.0–15.0)
Lymphocytes Relative: 23.5 % (ref 12.0–46.0)
Lymphs Abs: 1.7 10*3/uL (ref 0.7–4.0)
MCHC: 33.4 g/dL (ref 30.0–36.0)
MCV: 94.7 fl (ref 78.0–100.0)
Monocytes Absolute: 0.7 10*3/uL (ref 0.1–1.0)
Monocytes Relative: 10.4 % (ref 3.0–12.0)
Neutro Abs: 4.1 10*3/uL (ref 1.4–7.7)
Neutrophils Relative %: 57.7 % (ref 43.0–77.0)
Platelets: 242 10*3/uL (ref 150.0–400.0)
RBC: 4.26 Mil/uL (ref 3.87–5.11)
RDW: 13.9 % (ref 11.5–15.5)
WBC: 7.1 10*3/uL (ref 4.0–10.5)

## 2021-02-15 LAB — BASIC METABOLIC PANEL
BUN: 23 mg/dL (ref 6–23)
CO2: 28 mEq/L (ref 19–32)
Calcium: 9.6 mg/dL (ref 8.4–10.5)
Chloride: 103 mEq/L (ref 96–112)
Creatinine, Ser: 0.97 mg/dL (ref 0.40–1.20)
GFR: 56.8 mL/min — ABNORMAL LOW (ref >60.00)
Glucose, Bld: 101 mg/dL — ABNORMAL HIGH (ref 70–99)
Potassium: 4.5 mEq/L (ref 3.5–5.1)
Sodium: 141 mEq/L (ref 135–145)

## 2021-02-15 LAB — HEPATIC FUNCTION PANEL
ALT: 17 U/L (ref 0–35)
AST: 16 U/L (ref 0–37)
Albumin: 4.5 g/dL (ref 3.5–5.2)
Alkaline Phosphatase: 61 U/L (ref 39–117)
Bilirubin, Direct: 0.1 mg/dL (ref 0.0–0.3)
Total Bilirubin: 0.7 mg/dL (ref 0.2–1.2)
Total Protein: 7.1 g/dL (ref 6.0–8.3)

## 2021-02-15 LAB — VITAMIN B12: Vitamin B-12: 525 pg/mL (ref 211–911)

## 2021-02-15 LAB — VITAMIN D 25 HYDROXY (VIT D DEFICIENCY, FRACTURES): VITD: 50.16 ng/mL (ref 30.00–100.00)

## 2021-02-15 LAB — TSH: TSH: 1.42 u[IU]/mL (ref 0.35–5.50)

## 2021-02-17 ENCOUNTER — Ambulatory Visit: Payer: Medicare PPO | Admitting: Internal Medicine

## 2021-02-17 ENCOUNTER — Other Ambulatory Visit: Payer: Self-pay

## 2021-02-17 DIAGNOSIS — Z86010 Personal history of colonic polyps: Secondary | ICD-10-CM

## 2021-02-17 DIAGNOSIS — E538 Deficiency of other specified B group vitamins: Secondary | ICD-10-CM

## 2021-02-17 DIAGNOSIS — I471 Supraventricular tachycardia: Secondary | ICD-10-CM

## 2021-02-17 DIAGNOSIS — N1831 Chronic kidney disease, stage 3a: Secondary | ICD-10-CM | POA: Diagnosis not present

## 2021-02-17 DIAGNOSIS — Z853 Personal history of malignant neoplasm of breast: Secondary | ICD-10-CM

## 2021-02-17 DIAGNOSIS — M8580 Other specified disorders of bone density and structure, unspecified site: Secondary | ICD-10-CM | POA: Diagnosis not present

## 2021-02-17 DIAGNOSIS — E7800 Pure hypercholesterolemia, unspecified: Secondary | ICD-10-CM | POA: Diagnosis not present

## 2021-02-17 DIAGNOSIS — F419 Anxiety disorder, unspecified: Secondary | ICD-10-CM | POA: Diagnosis not present

## 2021-02-17 NOTE — Progress Notes (Signed)
Patient ID: Claudia Little, female   DOB: [birth year], 75 y.o.   MRN: [id number]   Subjective:    Patient ID: Claudia Little, female    DOB: [birth year], 75 y.o.   MRN: [id number]  This visit occurred during the SARS-CoV-2 public health emergency.  Safety protocols were in place, including screening questions prior to the visit, additional usage of staff PPE, and extensive cleaning of exam room while observing appropriate contact time as indicated for disinfecting solutions.   Patient here for a scheduled follow up.    HPI Here to follow up regarding cholesterol and increased stress.  On crestor.  Discussed labs.  Increase crestor to daily.  No chest pain or sob reported.  No abdominal pain or bowel change reported.  Discussed - change to oral B12.  No cough or congestion.  Discussed medication - for stress/anxiety.  Not sure if took buspar.     Past Medical History:  Diagnosis Date   Breast cancer ([place]) 12/2013   right breast cancer   Chronic anxiety    Dysrhythmia    SVT   History of kidney stones    Insomnia    Nephrolithiasis    Osteoporosis    PAC (premature atrial contraction)    Palpitations    Personal history of radiation therapy 2015   Right breast   SVT (supraventricular tachycardia) (HCC)    Past Surgical History:  Procedure Laterality Date   BREAST CYST ASPIRATION Left 10+ yrs ago   neg   BREAST EXCISIONAL BIOPSY Right 12/15/2013   DCIS   BREAST LUMPECTOMY     BREAST SURGERY Right 12/15/2013   lumpectomy   BUNIONECTOMY  early '70s   COLONOSCOPY WITH PROPOFOL N/A 05/27/2017   Procedure: COLONOSCOPY WITH PROPOFOL;  Surgeon: Lollie Sails, MD;  Location: [facility] ENDOSCOPY;  Service: Endoscopy;  Laterality: N/A;   DILATION AND CURETTAGE OF UTERUS  1973   after miscarriage   LITHOTRIPSY     Family History  Problem Relation Age of Onset   Aortic stenosis Mother    Congestive Heart Failure Mother    Aortic stenosis Father    Coronary artery disease Father         Deceased from complications of atherosclerotic CAD and Atherosclerosis   Congestive Heart Failure Father    Parkinsonism Brother    Colon polyps Sister    Arthritis Maternal Grandfather    Early death Paternal Grandmother    Heart block Brother    Stroke Maternal Grandmother    Kidney disease Paternal Grandfather    Breast cancer Maternal Aunt 70   Ovarian cancer Maternal Aunt    Social History   Socioeconomic History   Marital status: Married    Spouse name: Richard   Number of children: 1   Years of education: HS   Highest education level: Not on file  Occupational History   Occupation: Retired  Tobacco Use   Smoking status: Never   Smokeless tobacco: Former  Substance and Sexual Activity   Alcohol use: Yes    Alcohol/week: 7.0 standard drinks    Types: 7 Glasses of wine per week   Drug use: No   Sexual activity: Not Currently  Other Topics Concern   Not on file  Social History Narrative   Not on file   Social Determinants of Health   Financial Resource Strain: Low Risk    Difficulty of Paying Living Expenses: Not hard at all  Food Insecurity: No Food Insecurity   Worried  About Running Out of Food in the Last Year: Never true   Ran Out of Food in the Last Year: Never true  Transportation Needs: No Transportation Needs   Lack of Transportation (Medical): No   Lack of Transportation (Non-Medical): No  Physical Activity: Insufficiently Active   Days of Exercise per Week: 4 days   Minutes of Exercise per Session: 20 min  Stress: No Stress Concern Present   Feeling of Stress : Not at all  Social Connections: Unknown   Frequency of Communication with Friends and Family: Not on file   Frequency of Social Gatherings with Friends and Family: Not on file   Attends Religious Services: Not on file   Active Member of Clubs or Organizations: Not on file   Attends Archivist Meetings: Not on file   Marital Status: Married     Review of Systems   Constitutional:  Negative for appetite change and unexpected weight change.  HENT:  Negative for congestion and sinus pressure.   Respiratory:  Negative for cough, chest tightness and shortness of breath.   Cardiovascular:  Negative for chest pain, palpitations and leg swelling.  Gastrointestinal:  Negative for abdominal pain, diarrhea, nausea and vomiting.  Genitourinary:  Negative for difficulty urinating and dysuria.  Musculoskeletal:  Negative for joint swelling and myalgias.  Skin:  Negative for color change and rash.  Neurological:  Negative for dizziness, light-headedness and headaches.  Psychiatric/Behavioral:  Negative for agitation and dysphoric mood.       Objective:     BP 134/70   Pulse 67   Temp 97.6 F (36.4 C)   Resp 16   Ht 5\' 6"  (1.676 m)   Wt 151 lb 3.2 oz (68.6 kg)   SpO2 98%   BMI 24.40 kg/m  Wt Readings from Last 3 Encounters:  02/17/21 151 lb 3.2 oz (68.6 kg)  09/15/20 153 lb 6.4 oz (69.6 kg)  08/30/20 155 lb 6.4 oz (70.5 kg)    Physical Exam Vitals reviewed.  Constitutional:      General: She is not in acute distress.    Appearance: Normal appearance.  HENT:     Head: Normocephalic and atraumatic.     Right Ear: External ear normal.     Left Ear: External ear normal.  Eyes:     General: No scleral icterus.       Right eye: No discharge.        Left eye: No discharge.     Conjunctiva/sclera: Conjunctivae normal.  Neck:     Thyroid: No thyromegaly.  Cardiovascular:     Rate and Rhythm: Normal rate and regular rhythm.  Pulmonary:     Effort: No respiratory distress.     Breath sounds: Normal breath sounds. No wheezing.  Abdominal:     General: Bowel sounds are normal.     Palpations: Abdomen is soft.     Tenderness: There is no abdominal tenderness.  Musculoskeletal:        General: No swelling or tenderness.     Cervical back: Neck supple. No tenderness.  Lymphadenopathy:     Cervical: No cervical adenopathy.  Skin:    Findings: No  erythema or rash.  Neurological:     Mental Status: She is alert.  Psychiatric:        Mood and Affect: Mood normal.        Behavior: Behavior normal.     Outpatient Encounter Medications as of 02/17/2021  Medication Sig   alendronate (FOSAMAX)  70 MG tablet Take 70 mg by mouth once a week.   atenolol (TENORMIN) 25 MG tablet TAKE 1 TABLET BY MOUTH DAILY   busPIRone (BUSPAR) 5 MG tablet Take 1 tablet (5 mg total) by mouth at bedtime. (Patient not taking: Reported on 02/17/2021)   Cholecalciferol 25 MCG (1000 UT) tablet Take 4,000 Units by mouth daily.   Coenzyme Q10 100 MG TABS Take 1 tablet (100 mg total) by mouth daily.   rosuvastatin (CRESTOR) 5 MG tablet TAKE 1 TABLET BY MOUTH DAILY   No facility-administered encounter medications on file as of 02/17/2021.     Lab Results  Component Value Date   WBC 7.1 02/15/2021   HGB 13.5 02/15/2021   HCT 40.3 02/15/2021   PLT 242.0 02/15/2021   GLUCOSE 101 (H) 02/15/2021   CHOL 189 02/15/2021   TRIG 158.0 (H) 02/15/2021   HDL 54.50 02/15/2021   LDLDIRECT 96.0 09/13/2020   LDLCALC 103 (H) 02/15/2021   ALT 17 02/15/2021   AST 16 02/15/2021   NA 141 02/15/2021   K 4.5 02/15/2021   CL 103 02/15/2021   CREATININE 0.97 02/15/2021   BUN 23 02/15/2021   CO2 28 02/15/2021   TSH 1.42 02/15/2021   HGBA1C 5.3 01/09/2019    MM 3D SCREEN BREAST BILATERAL  Result Date: 12/21/2020 CLINICAL DATA:  Screening. RIGHT lumpectomy 2015 EXAM: DIGITAL SCREENING BILATERAL MAMMOGRAM WITH TOMOSYNTHESIS AND CAD TECHNIQUE: Bilateral screening digital craniocaudal and mediolateral oblique mammograms were obtained. Bilateral screening digital breast tomosynthesis was performed. The images were evaluated with computer-aided detection. COMPARISON:  Previous exam(s). ACR Breast Density Category c: The breast tissue is heterogeneously dense, which may obscure small masses. FINDINGS: There are no findings suspicious for malignancy. There is density and architectural  distortion within the RIGHT breast, consistent with postsurgical changes. These are stable in comparison to prior. IMPRESSION: No mammographic evidence of malignancy. A result letter of this screening mammogram will be mailed directly to the patient. RECOMMENDATION: Screening mammogram in one year. (Code:SM-B-01Y) BI-RADS CATEGORY  2: Benign. Electronically Signed   By: Valentino Saxon M.D.   On: 12/21/2020 12:55      Assessment & Plan:   Problem List Items Addressed This Visit     Anxiety    Discussed increased anxiety.  Discussed sleeping issues.  Retrial of BuSpar as outlined.  Follow-up.      B12 deficiency    Change to oral B12.  Follow.       CKD (chronic kidney disease) stage 3, GFR 30-59 ml/min (HCC)    Avoid antiinflammatories.  Stay hydrated.  GFR just checked - 56. Follow metabolic panel.       History of breast cancer    Mammogram 12/21/20 - Birads II.       History of colon polyps    Colonoscopy 05/2017 - clear.  Follow.       Hypercholesteremia    Tolerating Crestor.  Continue low-cholesterol diet and exercise.  Follow-up lipid panel liver function test. Increase to daily crestor.        Relevant Orders   Hepatic function panel   Lipid panel   Basic metabolic panel   osteoporosis    Continue fosamax.  Continue calcium, vitamin D and weight bearing exercise.        Supraventricular tachycardia ([place])    Has done well on atenolol.  Continue current dose.  Follow.  She feels is stable.         Einar Pheasant, MD

## 2021-02-17 NOTE — Patient Instructions (Signed)
Increase crestor to 5mg  daily  Stop B12 injections.    Start oral B12 1020mcg per day.

## 2021-02-25 ENCOUNTER — Encounter: Payer: Self-pay | Admitting: Internal Medicine

## 2021-02-25 NOTE — Assessment & Plan Note (Signed)
Change to oral B12.  Follow.

## 2021-02-25 NOTE — Assessment & Plan Note (Signed)
Continue fosamax.  Continue calcium, vitamin D and weight bearing exercise.

## 2021-02-25 NOTE — Assessment & Plan Note (Signed)
Discussed increased anxiety.  Discussed sleeping issues.  Retrial of BuSpar as outlined.  Follow-up.

## 2021-02-25 NOTE — Assessment & Plan Note (Signed)
Avoid antiinflammatories.  Stay hydrated.  GFR just checked - 56. Follow metabolic panel.

## 2021-02-25 NOTE — Assessment & Plan Note (Signed)
Tolerating Crestor.  Continue low-cholesterol diet and exercise.  Follow-up lipid panel liver function test. Increase to daily crestor.

## 2021-02-25 NOTE — Assessment & Plan Note (Signed)
Colonoscopy 05/2017 - clear.  Follow.  

## 2021-02-25 NOTE — Assessment & Plan Note (Signed)
Mammogram 12/21/20 - Birads II.

## 2021-02-25 NOTE — Assessment & Plan Note (Signed)
Has done well on atenolol.  Continue current dose.  Follow.  She feels is stable.  

## 2021-02-28 ENCOUNTER — Ambulatory Visit: Payer: Medicare PPO

## 2021-04-25 DIAGNOSIS — M810 Age-related osteoporosis without current pathological fracture: Secondary | ICD-10-CM | POA: Diagnosis not present

## 2021-04-25 LAB — HM DEXA SCAN

## 2021-06-06 DIAGNOSIS — E559 Vitamin D deficiency, unspecified: Secondary | ICD-10-CM | POA: Diagnosis not present

## 2021-06-06 DIAGNOSIS — M810 Age-related osteoporosis without current pathological fracture: Secondary | ICD-10-CM | POA: Diagnosis not present

## 2021-06-21 ENCOUNTER — Other Ambulatory Visit: Payer: Self-pay

## 2021-06-21 ENCOUNTER — Other Ambulatory Visit (INDEPENDENT_AMBULATORY_CARE_PROVIDER_SITE_OTHER): Payer: Medicare PPO

## 2021-06-21 DIAGNOSIS — E7800 Pure hypercholesterolemia, unspecified: Secondary | ICD-10-CM | POA: Diagnosis not present

## 2021-06-21 LAB — HEPATIC FUNCTION PANEL
ALT: 19 U/L (ref 0–35)
AST: 18 U/L (ref 0–37)
Albumin: 4.5 g/dL (ref 3.5–5.2)
Alkaline Phosphatase: 55 U/L (ref 39–117)
Bilirubin, Direct: 0.1 mg/dL (ref 0.0–0.3)
Total Bilirubin: 0.7 mg/dL (ref 0.2–1.2)
Total Protein: 6.9 g/dL (ref 6.0–8.3)

## 2021-06-21 LAB — BASIC METABOLIC PANEL
BUN: 17 mg/dL (ref 6–23)
CO2: 30 mEq/L (ref 19–32)
Calcium: 9.3 mg/dL (ref 8.4–10.5)
Chloride: 101 mEq/L (ref 96–112)
Creatinine, Ser: 0.89 mg/dL (ref 0.40–1.20)
GFR: 62.82 mL/min (ref >60.00)
Glucose, Bld: 99 mg/dL (ref 70–99)
Potassium: 4.2 mEq/L (ref 3.5–5.1)
Sodium: 138 mEq/L (ref 135–145)

## 2021-06-21 LAB — LIPID PANEL
Cholesterol: 146 mg/dL (ref 0–200)
HDL: 56.6 mg/dL (ref >39.00)
LDL Cholesterol: 60 mg/dL (ref 0–99)
NonHDL: 89.7
Total CHOL/HDL Ratio: 3
Triglycerides: 147 mg/dL (ref 0.0–149.0)
VLDL: 29.4 mg/dL (ref 0.0–40.0)

## 2021-06-23 ENCOUNTER — Other Ambulatory Visit: Payer: Self-pay

## 2021-06-23 ENCOUNTER — Telehealth: Payer: Self-pay | Admitting: Internal Medicine

## 2021-06-23 ENCOUNTER — Ambulatory Visit (INDEPENDENT_AMBULATORY_CARE_PROVIDER_SITE_OTHER): Payer: Medicare PPO | Admitting: Internal Medicine

## 2021-06-23 ENCOUNTER — Encounter: Payer: Self-pay | Admitting: Internal Medicine

## 2021-06-23 DIAGNOSIS — Z853 Personal history of malignant neoplasm of breast: Secondary | ICD-10-CM | POA: Diagnosis not present

## 2021-06-23 DIAGNOSIS — Z0000 Encounter for general adult medical examination without abnormal findings: Secondary | ICD-10-CM | POA: Diagnosis not present

## 2021-06-23 DIAGNOSIS — N644 Mastodynia: Secondary | ICD-10-CM | POA: Diagnosis not present

## 2021-06-23 DIAGNOSIS — E7800 Pure hypercholesterolemia, unspecified: Secondary | ICD-10-CM | POA: Diagnosis not present

## 2021-06-23 DIAGNOSIS — I471 Supraventricular tachycardia: Secondary | ICD-10-CM | POA: Diagnosis not present

## 2021-06-23 DIAGNOSIS — E538 Deficiency of other specified B group vitamins: Secondary | ICD-10-CM | POA: Diagnosis not present

## 2021-06-23 DIAGNOSIS — Z86010 Personal history of colonic polyps: Secondary | ICD-10-CM

## 2021-06-23 DIAGNOSIS — M8580 Other specified disorders of bone density and structure, unspecified site: Secondary | ICD-10-CM | POA: Diagnosis not present

## 2021-06-23 DIAGNOSIS — D0511 Intraductal carcinoma in situ of right breast: Secondary | ICD-10-CM | POA: Diagnosis not present

## 2021-06-23 NOTE — Progress Notes (Signed)
533 ?Patient ID: Claudia Little, female   DOB: 02-Jun-1945, 76 y.o.   MRN: [id number] ? ? ?Subjective:  ? ? Patient ID: Claudia Little, female    DOB: 11-18-1945, 76 y.o.   MRN: [id number] ? ?This visit occurred during the SARS-CoV-2 public health emergency.  Safety protocols were in place, including screening questions prior to the visit, additional usage of staff PPE, and extensive cleaning of exam room while observing appropriate contact time as indicated for disinfecting solutions.  ? ?Patient here for a scheduled follow up.  ?  ? ?HPI ?Was scheduled for physical.  Changed to f/u appt. Reports right breast pain - right lateral breast.  Some fullness.  Tries to stay active.  No chest pain or sob reported.  No abdominal pain or bowel change reported.  Had questions about reclast.  Discussed.  Plans to see her dentist soon. Also had question about B12. Changed to oral B12.  ? ? ?Past Medical History:  ?Diagnosis Date  ? Breast cancer ([place]) 12/2013  ? right breast cancer  ? Chronic anxiety   ? Dysrhythmia   ? SVT  ? History of kidney stones   ? Insomnia   ? Nephrolithiasis   ? Osteoporosis   ? PAC (premature atrial contraction)   ? Palpitations   ? Personal history of radiation therapy 2015  ? Right breast  ? SVT (supraventricular tachycardia) (HCC)   ? ?Past Surgical History:  ?Procedure Laterality Date  ? BREAST CYST ASPIRATION Left 10+ yrs ago  ? neg  ? BREAST EXCISIONAL BIOPSY Right 12/15/2013  ? DCIS  ? BREAST LUMPECTOMY    ? BREAST SURGERY Right 12/15/2013  ? lumpectomy  ? BUNIONECTOMY  early '70s  ? COLONOSCOPY WITH PROPOFOL N/A 05/27/2017  ? Procedure: COLONOSCOPY WITH PROPOFOL;  Surgeon: Lollie Sails, MD;  Location: [facility] ENDOSCOPY;  Service: Endoscopy;  Laterality: N/A;  ? [place]  ? after miscarriage  ? LITHOTRIPSY    ? ?Family History  ?Problem Relation Age of Onset  ? Aortic stenosis Mother   ? Congestive Heart Failure Mother   ? Aortic stenosis Father   ? Coronary artery  disease Father   ?     Deceased from complications of atherosclerotic CAD and Atherosclerosis  ? Congestive Heart Failure Father   ? Parkinsonism Brother   ? Colon polyps Sister   ? Arthritis Maternal Grandfather   ? Early death Paternal Grandmother   ? Heart block Brother   ? Stroke Maternal Grandmother   ? Kidney disease Paternal Grandfather   ? Breast cancer Maternal Aunt 46  ? Ovarian cancer Maternal Aunt   ? ?Social History  ? ?Socioeconomic History  ? Marital status: Married  ?  Spouse name: Delfino Lovett  ? Number of children: 1  ? Years of education: HS  ? Highest education level: Not on file  ?Occupational History  ? Occupation: Retired  ?Tobacco Use  ? Smoking status: Never  ? Smokeless tobacco: Former  ?Substance and Sexual Activity  ? Alcohol use: Yes  ?  Alcohol/week: 7.0 standard drinks  ?  Types: 7 Glasses of wine per week  ? Drug use: No  ? Sexual activity: Not Currently  ?Other Topics Concern  ? Not on file  ?Social History Narrative  ? Not on file  ? ?Social Determinants of Health  ? ?Financial Resource Strain: Low Risk   ? Difficulty of Paying Living Expenses: Not hard at all  ?Food Insecurity: No  Food Insecurity  ? Worried About Charity fundraiser in the Last Year: Never true  ? Ran Out of Food in the Last Year: Never true  ?Transportation Needs: No Transportation Needs  ? Lack of Transportation (Medical): No  ? Lack of Transportation (Non-Medical): No  ?Physical Activity: Insufficiently Active  ? Days of Exercise per Week: 4 days  ? Minutes of Exercise per Session: 20 min  ?Stress: No Stress Concern Present  ? Feeling of Stress : Not at all  ?Social Connections: Unknown  ? Frequency of Communication with Friends and Family: Not on file  ? Frequency of Social Gatherings with Friends and Family: Not on file  ? Attends Religious Services: Not on file  ? Active Member of Clubs or Organizations: Not on file  ? Attends Archivist Meetings: Not on file  ? Marital Status: Married  ? ? ? ?Review of  Systems  ?Constitutional:  Negative for appetite change and unexpected weight change.  ?HENT:  Negative for congestion and sinus pressure.   ?Respiratory:  Negative for cough, chest tightness and shortness of breath.   ?Cardiovascular:  Negative for chest pain, palpitations and leg swelling.  ?Gastrointestinal:  Negative for abdominal pain, diarrhea, nausea and vomiting.  ?Genitourinary:  Negative for difficulty urinating and dysuria.  ?Musculoskeletal:  Negative for joint swelling and myalgias.  ?Skin:  Negative for color change and rash.  ?Neurological:  Negative for dizziness, light-headedness and headaches.  ?Psychiatric/Behavioral:  Negative for agitation and dysphoric mood.   ? ?   ?Objective:  ?  ? ?BP 118/70   Pulse 69   Temp 97.9 ?F (36.6 ?C)   Resp 16   Ht 5' 6.5" (1.689 m)   Wt 155 lb 12.8 oz (70.7 kg)   SpO2 98%   BMI 24.77 kg/m?  ?Wt Readings from Last 3 Encounters:  ?06/23/21 155 lb 12.8 oz (70.7 kg)  ?02/17/21 151 lb 3.2 oz (68.6 kg)  ?09/15/20 153 lb 6.4 oz (69.6 kg)  ? ? ?Physical Exam ?Vitals reviewed.  ?Constitutional:   ?   General: She is not in acute distress. ?   Appearance: Normal appearance.  ?HENT:  ?   Head: Normocephalic and atraumatic.  ?   Right Ear: External ear normal.  ?   Left Ear: External ear normal.  ?Eyes:  ?   General: No scleral icterus.    ?   Right eye: No discharge.     ?   Left eye: No discharge.  ?   Conjunctiva/sclera: Conjunctivae normal.  ?Neck:  ?   Thyroid: No thyromegaly.  ?Cardiovascular:  ?   Rate and Rhythm: Normal rate and regular rhythm.  ?Pulmonary:  ?   Effort: No respiratory distress.  ?   Breath sounds: Normal breath sounds. No wheezing.  ?   Comments: Breasts:  right breast fullness 9-10:00.  No significant pain on exam.  No nipple discharge or nipple retraction present.  No axillary adenopathy.  Left breast - no nipple discharge or nipple retraction present.  Could no appreciate any distinct nodule or axillary adenopathy.  ?Abdominal:  ?   General:  Bowel sounds are normal.  ?   Palpations: Abdomen is soft.  ?   Tenderness: There is no abdominal tenderness.  ?Musculoskeletal:     ?   General: No swelling or tenderness.  ?   Cervical back: Neck supple. No tenderness.  ?Lymphadenopathy:  ?   Cervical: No cervical adenopathy.  ?Skin: ?   Findings: No  erythema or rash.  ?Neurological:  ?   Mental Status: She is alert.  ?Psychiatric:     ?   Mood and Affect: Mood normal.     ?   Behavior: Behavior normal.  ? ? ? ?Outpatient Encounter Medications as of 06/23/2021  ?Medication Sig  ? alendronate (FOSAMAX) 70 MG tablet Take 70 mg by mouth once a week.  ? atenolol (TENORMIN) 25 MG tablet TAKE 1 TABLET BY MOUTH DAILY  ? Cholecalciferol 25 MCG (1000 UT) tablet Take 4,000 Units by mouth daily.  ? Coenzyme Q10 100 MG TABS Take 1 tablet (100 mg total) by mouth daily.  ? cyanocobalamin 1000 MCG tablet Take by mouth.  ? rosuvastatin (CRESTOR) 5 MG tablet TAKE 1 TABLET BY MOUTH DAILY  ? [DISCONTINUED] busPIRone (BUSPAR) 5 MG tablet Take 1 tablet (5 mg total) by mouth at bedtime. (Patient not taking: Reported on 02/17/2021)  ? ?No facility-administered encounter medications on file as of 06/23/2021.  ?  ? ?Lab Results  ?Component Value Date  ? WBC 7.1 02/15/2021  ? HGB 13.5 02/15/2021  ? HCT 40.3 02/15/2021  ? PLT 242.0 02/15/2021  ? GLUCOSE 99 06/21/2021  ? CHOL 146 06/21/2021  ? TRIG 147.0 06/21/2021  ? HDL 56.60 06/21/2021  ? LDLDIRECT 96.0 09/13/2020  ? [place] 60 06/21/2021  ? ALT 19 06/21/2021  ? AST 18 06/21/2021  ? NA 138 06/21/2021  ? K 4.2 06/21/2021  ? CL 101 06/21/2021  ? CREATININE 0.89 06/21/2021  ? BUN 17 06/21/2021  ? CO2 30 06/21/2021  ? TSH 1.42 02/15/2021  ? HGBA1C 5.3 01/09/2019  ? ? ?MM 3D SCREEN BREAST BILATERAL ? ?Result Date: 12/21/2020 ?CLINICAL DATA:  Screening. RIGHT lumpectomy 2015 EXAM: DIGITAL SCREENING BILATERAL MAMMOGRAM WITH TOMOSYNTHESIS AND CAD TECHNIQUE: Bilateral screening digital craniocaudal and mediolateral oblique mammograms were obtained.  Bilateral screening digital breast tomosynthesis was performed. The images were evaluated with computer-aided detection. COMPARISON:  Previous exam(s). ACR Breast Density Category c: The breast tissue is hetero

## 2021-06-23 NOTE — Assessment & Plan Note (Addendum)
Mammogram 12/21/20 - Birads II.  Colonoscopy 05/2017 - normal.  appt changed to f/u appt.  ?

## 2021-06-23 NOTE — Telephone Encounter (Signed)
Lft pt vm to call [place] to sch diag mammo and [place]. thanks ?

## 2021-06-24 ENCOUNTER — Encounter: Payer: Self-pay | Admitting: Internal Medicine

## 2021-06-24 NOTE — Assessment & Plan Note (Signed)
Mammogram 12/21/20 - Birads II.  ?

## 2021-06-24 NOTE — Assessment & Plan Note (Signed)
Tolerating Crestor.  Continue low-cholesterol diet and exercise.  Follow-up lipid panel liver function test.  ?Lab Results  ?Component Value Date  ? CHOL 146 06/21/2021  ? HDL 56.60 06/21/2021  ? [place] 60 06/21/2021  ? LDLDIRECT 96.0 09/13/2020  ? TRIG 147.0 06/21/2021  ? CHOLHDL 3 06/21/2021  ? ?

## 2021-06-24 NOTE — Assessment & Plan Note (Signed)
On oral B12.  Check B12 level with next labs.  ?

## 2021-06-24 NOTE — Assessment & Plan Note (Signed)
Has done well on atenolol.  Continue current dose.  Follow.  She feels is stable.  

## 2021-06-24 NOTE — Assessment & Plan Note (Signed)
Right breast pain as outlined.  Exam as outlined.  Schedule for right breast diagnostic mammogram and possible ultrasound.  ?

## 2021-06-24 NOTE — Assessment & Plan Note (Signed)
Has been on fosamax.  Saw Dr Honor Junes.  Discussed reclast. Had questions today about reclast.  Discussed.  Wants to discuss with her dentist.  Continue calcium, vitamin D and weight bearing exercise.   ?

## 2021-06-24 NOTE — Assessment & Plan Note (Signed)
S/p lumpectomy - right breast.  S/p XRT. ER/PR positive.  Mammogram 12/21/2020 - Birads II.   With right breast pain.  Exam as outlined.  Schedule right breast mammogram with possible ultrasound.  ?

## 2021-06-24 NOTE — Assessment & Plan Note (Signed)
Colonoscopy 05/2017 - clear.  Follow.  

## 2021-07-11 ENCOUNTER — Ambulatory Visit
Admission: RE | Admit: 2021-07-11 | Discharge: 2021-07-11 | Disposition: A | Discharge status: Home / Self Care | Payer: Medicare PPO | Source: Ambulatory Visit | Attending: Internal Medicine | Admitting: Internal Medicine

## 2021-07-11 DIAGNOSIS — N644 Mastodynia: Secondary | ICD-10-CM | POA: Diagnosis not present

## 2021-07-11 DIAGNOSIS — R922 Inconclusive mammogram: Secondary | ICD-10-CM | POA: Diagnosis not present

## 2021-07-11 DIAGNOSIS — Z853 Personal history of malignant neoplasm of breast: Secondary | ICD-10-CM | POA: Diagnosis not present

## 2021-08-15 ENCOUNTER — Other Ambulatory Visit: Payer: Self-pay | Admitting: Internal Medicine

## 2021-08-15 DIAGNOSIS — E7800 Pure hypercholesterolemia, unspecified: Secondary | ICD-10-CM

## 2021-08-15 DIAGNOSIS — I471 Supraventricular tachycardia: Secondary | ICD-10-CM

## 2021-08-30 DIAGNOSIS — M810 Age-related osteoporosis without current pathological fracture: Secondary | ICD-10-CM | POA: Diagnosis not present

## 2021-08-31 ENCOUNTER — Ambulatory Visit (INDEPENDENT_AMBULATORY_CARE_PROVIDER_SITE_OTHER): Payer: Medicare PPO

## 2021-08-31 VITALS — Ht 66.5 in | Wt 155.0 lb

## 2021-08-31 DIAGNOSIS — Z0000 Encounter for general adult medical examination without abnormal findings: Secondary | ICD-10-CM | POA: Diagnosis not present

## 2021-08-31 NOTE — Patient Instructions (Addendum)
  Claudia Little , Thank you for taking time to come for your Medicare Wellness Visit. I appreciate your ongoing commitment to your health goals. Please review the following plan we discussed and let me know if I can assist you in the future.   These are the goals we discussed:  Goals       Patient Stated     Exercise 150 minutes per week (moderate activity) (pt-stated)      She plans to increase her physical activity when possible.  Walk for exercise.  Weight goal 145lb.        This is a list of the screening recommended for you and due dates:  Health Maintenance  Topic Date Due   COVID-19 Vaccine (6 - Booster for Pfizer series) 09/16/2021*   Flu Shot  10/31/2021   Tetanus Vaccine  03/22/2024   Pneumonia Vaccine  Completed   DEXA scan (bone density measurement)  Completed   Hepatitis C Screening: USPSTF Recommendation to screen - Ages 23-79 yo.  Completed   Zoster (Shingles) Vaccine  Completed   HPV Vaccine  Aged Out   Colon Cancer Screening  Discontinued  *Topic was postponed. The date shown is not the original due date.

## 2021-08-31 NOTE — Progress Notes (Signed)
Subjective:   Claudia Little is a 76 y.o. female who presents for Medicare Annual (Subsequent) preventive examination.  Review of Systems    No ROS.  Medicare Wellness Virtual Visit.  Visual/audio telehealth visit, UTA vital signs.   See social history for additional risk factors.   Cardiac Risk Factors include: advanced age (>73mn, >>71women)     Objective:    Today's Vitals   08/31/21 0843  Weight: 155 lb (70.3 kg)  Height: 5' 6.5" (1.689 m)   Body mass index is 24.64 kg/m.     08/31/2021    8:47 AM 08/30/2020   10:09 AM 08/25/2019   10:59 AM 06/04/2019    8:55 AM 08/22/2018   10:45 AM 05/01/2018    9:07 AM 08/20/2017    2:35 PM  Advanced Directives  Does Patient Have a Medical Advance Directive? Yes Yes Yes Yes Yes No Yes  Type of AParamedicof [place]UnionLiving will Healthcare Power of AIngramLiving will H[place]erLiving will Living will;Healthcare Power of AHarmonyLiving will  Does patient want to make changes to medical advance directive? No - Patient declined No - Patient declined No - Patient declined No - Patient declined No - Patient declined  No - Patient declined  Copy of HColomein Chart? Yes - validated most recent copy scanned in chart (See row information) No - copy requested No - copy requested No - copy requested No - copy requested  No - copy requested  Would patient like information on creating a medical advance directive?      No - Patient declined     Current Medications (verified) Outpatient Encounter Medications as of 08/31/2021  Medication Sig   alendronate (FOSAMAX) 70 MG tablet Take 70 mg by mouth once a week.   atenolol (TENORMIN) 25 MG tablet TAKE 1 TABLET BY MOUTH DAILY   Cholecalciferol 25 MCG (1000 UT) tablet Take 4,000 Units by mouth daily.   Coenzyme Q10 100 MG TABS Take 1 tablet (100 mg total) by mouth daily.    cyanocobalamin 1000 MCG tablet Take by mouth.   rosuvastatin (CRESTOR) 5 MG tablet TAKE 1 TABLET BY MOUTH DAILY   No facility-administered encounter medications on file as of 08/31/2021.    Allergies (verified) Oxycodone   History: Past Medical History:  Diagnosis Date   [place]   right breast cancer   Chronic anxiety    Dysrhythmia    SVT   History of kidney stones    Insomnia    Nephrolithiasis    Osteoporosis    PAC (premature atrial contraction)    Palpitations    Personal history of radiation therapy 2015   Right breast   SVT (supraventricular tachycardia) (HCC)    Past Surgical History:  Procedure Laterality Date   BREAST CYST ASPIRATION Left 10+ yrs ago   neg   BREAST EXCISIONAL BIOPSY Right 12/15/2013   DCIS   BREAST LUMPECTOMY     BREAST SURGERY Right 12/15/2013   lumpectomy   BUNIONECTOMY  early '70s   COLONOSCOPY WITH PROPOFOL N/A 05/27/2017   Procedure: COLONOSCOPY WITH PROPOFOL;  Surgeon: SLollie Sails MD;  [facility];  Service: Endoscopy;  Laterality: N/A;   DILATION AND CURETTAGE OF UTERUS  1973   after miscarriage   LITHOTRIPSY     Family History  Problem Relation Age of Onset   Aortic stenosis Mother  Congestive Heart Failure Mother    Aortic stenosis Father    Coronary artery disease Father        Deceased from complications of atherosclerotic CAD and Atherosclerosis   Congestive Heart Failure Father    Parkinsonism Brother    Colon polyps Sister    Arthritis Maternal Grandfather    Early death Paternal Grandmother    Heart block Brother    Stroke Maternal Grandmother    Kidney disease Paternal Grandfather    Breast cancer Maternal Aunt 70   Ovarian cancer Maternal Aunt    Social History   Socioeconomic History   Marital status: Married    Spouse name: Richard   Number of children: 1   Years of education: HS   Highest education level: Not on file  Occupational History   Occupation: Retired   Tobacco Use   Smoking status: Never   Smokeless tobacco: Former  Substance and Sexual Activity   Alcohol use: Yes    Alcohol/week: 7.0 standard drinks    Types: 7 Glasses of wine per week   Drug use: No   Sexual activity: Not Currently  Other Topics Concern   Not on file  Social History Narrative   Not on file   Social Determinants of Health   Financial Resource Strain: Low Risk    Difficulty of Paying Living Expenses: Not hard at all  Food Insecurity: No Food Insecurity   Worried About Charity fundraiser in the Last Year: Never true   [place] in the Last Year: Never true  Transportation Needs: No Transportation Needs   Lack of Transportation (Medical): No   Lack of Transportation (Non-Medical): No  Physical Activity: Insufficiently Active   Days of Exercise per Week: 4 days   Minutes of Exercise per Session: 20 min  Stress: No Stress Concern Present   Feeling of Stress : Not at all  Social Connections: Unknown   Frequency of Communication with Friends and Family: Not on file   Frequency of Social Gatherings with Friends and Family: Not on file   Attends Religious Services: Not on Electrical engineer or Organizations: Not on file   Attends Archivist Meetings: Not on file   Marital Status: Married    Tobacco Counseling Counseling given: Not Answered   Clinical Intake:  Pre-visit preparation completed: Yes        Diabetes: No  How often do you need to have someone help you when you read instructions, pamphlets, or other written materials from your doctor or pharmacy?: 1 - Never  Interpreter Needed?: No      Activities of Daily Living    08/31/2021    8:42 AM  In your present state of health, do you have any difficulty performing the following activities:  Hearing? 0  Vision? 0  Difficulty concentrating or making decisions? 0  Walking or climbing stairs? 0  Dressing or bathing? 0  Doing errands, shopping? 0  Preparing  Food and eating ? N  Using the Toilet? N  In the past six months, have you accidently leaked urine? N  Do you have problems with loss of bowel control? N  Managing your Medications? N  Managing your Finances? N  Housekeeping or managing your Housekeeping? N    Patient Care Team: Einar Pheasant, MD as PCP - General (Internal Medicine)  Indicate any recent Medical Services you may have received from other than [facility] providers in the past year (date  may be approximate).     Assessment:   This is a routine wellness examination for Margit.  Virtual Visit via Telephone Note  I connected with  [place] on 08/31/21 at  9:45 AM EDT by telephone and verified that I am speaking with the correct person using two identifiers.  Persons participating in the virtual visit: patient/Nurse Health Advisor   I discussed the limitations of performing an evaluation and management service by telehealth. We continued and completed visit with audio only. Some vital signs may be absent or patient reported.   Hearing/Vision screen Hearing Screening - Comments:: Patient is able to hear conversational tones without difficulty. No issues reported. Vision Screening - Comments:: Followed by [facility] Wears corrective lenses  They have regular follow up with the ophthalmologist  Dietary issues and exercise activities discussed:   Regular diet Good water intake   Goals Addressed               This Visit's Progress     Patient Stated     Exercise 150 minutes per week (moderate activity) (pt-stated)        She plans to increase her physical activity when possible.  Walk for exercise.  Weight goal 145lb. Portion control meals       Depression Screen    08/31/2021    8:47 AM 08/30/2020    9:54 AM 08/25/2019   10:48 AM 08/22/2018    1:21 PM 08/22/2018   10:46 AM 08/20/2017    2:13 PM 04/17/2017    8:38 AM  PHQ 2/9 Scores  PHQ - 2 Score 0 0 0 0 0 0 0  PHQ- 9 Score    0       Fall  Risk    08/31/2021    8:49 AM 08/30/2020    9:58 AM 08/25/2019   10:48 AM 08/22/2018    1:20 PM 08/22/2018   10:46 AM  Fall Risk   Falls in the past year? 0 0 0 0 0  Number falls in past yr: 0 0  0   Injury with Fall?  0  0   Follow up Falls evaluation completed Falls evaluation completed Falls evaluation completed Falls evaluation completed     [place]:  Home free of loose throw rugs in walkways, pet beds, electrical cords, etc? Yes  Adequate lighting in your home to reduce risk of falls? Yes   ASSISTIVE DEVICES UTILIZED TO PREVENT FALLS: Life alert? No  Use of a cane, walker or w/c? No   TIMED UP AND GO: Was the test performed? No .   Cognitive Function: Patient is alert and oriented x3. . Enjoys reading for brain stimulation.     08/20/2017    2:41 PM  MMSE - Mini Mental State Exam  Orientation to time 5  Orientation to Place 5  Registration 3  Attention/ Calculation 5  Recall 3  Language- name 2 objects 2  Language- repeat 1  Language- follow 3 step command 3  Language- read & follow direction 1  Write a sentence 1  Copy design 1  Total score 30        08/22/2018   10:48 AM  6CIT Screen  What Year? 0 points  What month? 0 points  What time? 0 points  Count back from 20 0 points  Months in reverse 0 points  Repeat phrase 0 points  Total Score 0 points    Immunizations Immunization  History  Administered Date(s) Administered   Influenza Split 02/14/2012   Influenza, High Dose Seasonal PF 01/08/2019   Influenza-Unspecified 01/24/2017, 12/03/2017, 12/28/2019, 02/03/2021   PFIZER(Purple Top)SARS-COV-2 Vaccination 04/06/2019, 04/27/2019, 12/28/2019, 07/12/2020, 02/17/2021   Pneumococcal Conjugate-13 03/22/2014, 04/10/2017   Pneumococcal Polysaccharide-23 01/16/2011, 04/10/2017   Tdap 03/22/2014   Zoster Recombinat (Shingrix) 08/21/2017, 11/11/2017   Zoster, Live 07/13/2010    Screening Tests Health Maintenance   Topic Date Due   COVID-19 Vaccine (6 - Booster for [place] series) 09/16/2021 (Originally 04/14/2021)   INFLUENZA VACCINE  10/31/2021   TETANUS/TDAP  03/22/2024   Pneumonia Vaccine 31+ Years old  Completed   DEXA SCAN  Completed   Hepatitis C Screening  Completed   Zoster Vaccines- Shingrix  Completed   HPV VACCINES  Aged Out   COLONOSCOPY (Pts 45-75yr Insurance coverage will need to be confirmed)  Discontinued   Health Maintenance There are no preventive care reminders to display for this patient.  Lung Cancer Screening: (Low Dose CT Chest recommended if Age 77-80years, 30 pack-year currently smoking OR have quit w/in 15years.) does not qualify.   Vision Screening: Recommended annual ophthalmology exams for early detection of glaucoma and other disorders of the eye.  Dental Screening: Recommended annual dental exams for proper oral hygiene  Community Resource Referral / Chronic Care Management: CRR required this visit?  No   CCM required this visit?  No      Plan:   Keep all routine maintenance appointments.   I have personally reviewed and noted the following in the patient's chart:   Medical and social history Use of alcohol, tobacco or illicit drugs  Current medications and supplements including opioid prescriptions.  Functional ability and status Nutritional status Physical activity Advanced directives List of other physicians Hospitalizations, surgeries, and ER visits in previous 12 months Vitals Screenings to include cognitive, depression, and falls Referrals and appointments  In addition, I have reviewed and discussed with patient certain preventive protocols, quality metrics, and best practice recommendations. A written personalized care plan for preventive services as well as general preventive health recommendations were provided to patient.     OVarney Biles LPN   [id number]

## 2021-09-20 DIAGNOSIS — H43813 Vitreous degeneration, bilateral: Secondary | ICD-10-CM | POA: Diagnosis not present

## 2021-09-20 DIAGNOSIS — Z0100 Encounter for examination of eyes and vision without abnormal findings: Secondary | ICD-10-CM | POA: Diagnosis not present

## 2021-10-18 DIAGNOSIS — L814 Other melanin hyperpigmentation: Secondary | ICD-10-CM | POA: Diagnosis not present

## 2021-10-18 DIAGNOSIS — D2272 Melanocytic nevi of left lower limb, including hip: Secondary | ICD-10-CM | POA: Diagnosis not present

## 2021-10-18 DIAGNOSIS — L728 Other follicular cysts of the skin and subcutaneous tissue: Secondary | ICD-10-CM | POA: Diagnosis not present

## 2021-10-18 DIAGNOSIS — L570 Actinic keratosis: Secondary | ICD-10-CM | POA: Diagnosis not present

## 2021-10-18 DIAGNOSIS — D2262 Melanocytic nevi of left upper limb, including shoulder: Secondary | ICD-10-CM | POA: Diagnosis not present

## 2021-10-18 DIAGNOSIS — Z85828 Personal history of other malignant neoplasm of skin: Secondary | ICD-10-CM | POA: Diagnosis not present

## 2021-10-18 DIAGNOSIS — D2261 Melanocytic nevi of right upper limb, including shoulder: Secondary | ICD-10-CM | POA: Diagnosis not present

## 2021-10-19 ENCOUNTER — Other Ambulatory Visit (INDEPENDENT_AMBULATORY_CARE_PROVIDER_SITE_OTHER): Payer: Medicare PPO

## 2021-10-19 DIAGNOSIS — E7800 Pure hypercholesterolemia, unspecified: Secondary | ICD-10-CM | POA: Diagnosis not present

## 2021-10-19 DIAGNOSIS — E538 Deficiency of other specified B group vitamins: Secondary | ICD-10-CM

## 2021-10-19 LAB — BASIC METABOLIC PANEL
BUN: 15 mg/dL (ref 6–23)
CO2: 28 mEq/L (ref 19–32)
Calcium: 8.6 mg/dL (ref 8.4–10.5)
Chloride: 104 mEq/L (ref 96–112)
Creatinine, Ser: 0.98 mg/dL (ref 0.40–1.20)
GFR: 55.84 mL/min — ABNORMAL LOW (ref >60.00)
Glucose, Bld: 95 mg/dL (ref 70–99)
Potassium: 4 mEq/L (ref 3.5–5.1)
Sodium: 141 mEq/L (ref 135–145)

## 2021-10-19 LAB — LIPID PANEL
Cholesterol: 148 mg/dL (ref 0–200)
HDL: 46.6 mg/dL (ref >39.00)
LDL Cholesterol: 61 mg/dL (ref 0–99)
NonHDL: 100.95
Total CHOL/HDL Ratio: 3
Triglycerides: 199 mg/dL — ABNORMAL HIGH (ref 0.0–149.0)
VLDL: 39.8 mg/dL (ref 0.0–40.0)

## 2021-10-19 LAB — HEPATIC FUNCTION PANEL
ALT: 16 U/L (ref 0–35)
AST: 16 U/L (ref 0–37)
Albumin: 4.3 g/dL (ref 3.5–5.2)
Alkaline Phosphatase: 56 U/L (ref 39–117)
Bilirubin, Direct: 0.1 mg/dL (ref 0.0–0.3)
Total Bilirubin: 0.7 mg/dL (ref 0.2–1.2)
Total Protein: 7 g/dL (ref 6.0–8.3)

## 2021-10-19 LAB — VITAMIN B12: Vitamin B-12: 659 pg/mL (ref 211–911)

## 2021-10-24 ENCOUNTER — Encounter: Payer: Self-pay | Admitting: Internal Medicine

## 2021-10-24 ENCOUNTER — Ambulatory Visit (INDEPENDENT_AMBULATORY_CARE_PROVIDER_SITE_OTHER): Payer: Medicare PPO | Admitting: Internal Medicine

## 2021-10-24 VITALS — BP 102/60 | HR 70 | Temp 97.3°F | Resp 21 | Ht 66.0 in | Wt 154.4 lb

## 2021-10-24 DIAGNOSIS — Z860100 Personal history of colon polyps, unspecified: Secondary | ICD-10-CM

## 2021-10-24 DIAGNOSIS — N1831 Chronic kidney disease, stage 3a: Secondary | ICD-10-CM

## 2021-10-24 DIAGNOSIS — Z1231 Encounter for screening mammogram for malignant neoplasm of breast: Secondary | ICD-10-CM | POA: Diagnosis not present

## 2021-10-24 DIAGNOSIS — Z0000 Encounter for general adult medical examination without abnormal findings: Secondary | ICD-10-CM

## 2021-10-24 DIAGNOSIS — I471 Supraventricular tachycardia, unspecified: Secondary | ICD-10-CM

## 2021-10-24 DIAGNOSIS — E7800 Pure hypercholesterolemia, unspecified: Secondary | ICD-10-CM | POA: Diagnosis not present

## 2021-10-24 DIAGNOSIS — D0511 Intraductal carcinoma in situ of right breast: Secondary | ICD-10-CM

## 2021-10-24 DIAGNOSIS — M8580 Other specified disorders of bone density and structure, unspecified site: Secondary | ICD-10-CM

## 2021-10-24 DIAGNOSIS — N644 Mastodynia: Secondary | ICD-10-CM | POA: Diagnosis not present

## 2021-10-24 MED ORDER — ROSUVASTATIN CALCIUM 5 MG PO TABS: 5.0000 mg | ORAL_TABLET | Freq: Every day | ORAL | 1 refills | Status: DC

## 2021-10-24 MED ORDER — ATENOLOL 25 MG PO TABS: 25.0000 mg | ORAL_TABLET | Freq: Every day | ORAL | 1 refills | Status: DC

## 2021-10-24 NOTE — Progress Notes (Signed)
Patient ID: Claudia Little, female   DOB: [birth year], 76 y.o.   MRN: [id number]   Subjective:    Patient ID: Claudia Little, female    DOB: 1945-11-05, 76 y.o.   MRN: [id number]   Patient here for her physical exam.   Chief Complaint  Patient presents with   Follow-up    Yearly CPE   .   HPI She is doing well.  Feels good.  Stays active.  No chest pain or sob reported.  No abdominal pain or bowel change reported.  No breast pain now.  Resolved.  Eating raisen bran - keeping bowels regular.  Handling stress.     Past Medical History:  Diagnosis Date   Breast cancer ([place]) 12/2013   right breast cancer   Chronic anxiety    Dysrhythmia    SVT   History of kidney stones    Insomnia    Nephrolithiasis    Osteoporosis    PAC (premature atrial contraction)    Palpitations    Personal history of radiation therapy 2015   Right breast   SVT (supraventricular tachycardia) (HCC)    Past Surgical History:  Procedure Laterality Date   BREAST CYST ASPIRATION Left 10+ yrs ago   neg   BREAST EXCISIONAL BIOPSY Right 12/15/2013   DCIS   BREAST LUMPECTOMY     BREAST SURGERY Right 12/15/2013   lumpectomy   BUNIONECTOMY  early '70s   COLONOSCOPY WITH PROPOFOL N/A 05/27/2017   Procedure: COLONOSCOPY WITH PROPOFOL;  Surgeon: Lollie Sails, MD;  Location: [facility] ENDOSCOPY;  Service: Endoscopy;  Laterality: N/A;   DILATION AND CURETTAGE OF UTERUS  1973   after miscarriage   LITHOTRIPSY     Family History  Problem Relation Age of Onset   Aortic stenosis Mother    Congestive Heart Failure Mother    Aortic stenosis Father    Coronary artery disease Father        Deceased from complications of atherosclerotic CAD and Atherosclerosis   Congestive Heart Failure Father    Parkinsonism Brother    Colon polyps Sister    Arthritis Maternal Grandfather    Early death Paternal Grandmother    Heart block Brother    Stroke Maternal Grandmother    Kidney disease Paternal Grandfather    Breast  cancer Maternal Aunt 70   Ovarian cancer Maternal Aunt    Social History   Socioeconomic History   Marital status: Married    Spouse name: Richard   Number of children: 1   Years of education: HS   Highest education level: Not on file  Occupational History   Occupation: Retired  Tobacco Use   Smoking status: Never   Smokeless tobacco: Former  Substance and Sexual Activity   Alcohol use: Yes    Alcohol/week: 7.0 standard drinks of alcohol    Types: 7 Glasses of wine per week   Drug use: No   Sexual activity: Not Currently  Other Topics Concern   Not on file  Social History Narrative   Not on file   Social Determinants of Health   Financial Resource Strain: Low Risk  (08/31/2021)   Overall Financial Resource Strain (CARDIA)    Difficulty of Paying Living Expenses: Not hard at all  Food Insecurity: No Food Insecurity (08/31/2021)   Hunger Vital Sign    Worried About Running Out of Food in the Last Year: Never true    Ran Out of Food in the Last Year: Never true  Transportation Needs: No Transportation Needs (08/31/2021)   PRAPARE - Hydrologist (Medical): No    Lack of Transportation (Non-Medical): No  Physical Activity: Insufficiently Active (08/31/2021)   Exercise Vital Sign    Days of Exercise per Week: 4 days    Minutes of Exercise per Session: 20 min  Stress: No Stress Concern Present (08/31/2021)   [place]    Feeling of Stress : Not at all  Social Connections: Unknown (08/31/2021)   Social Connection and Isolation Panel [NHANES]    Frequency of Communication with Friends and Family: Not on file    Frequency of Social Gatherings with Friends and Family: Not on file    Attends Religious Services: Not on file    Active Member of Clubs or Organizations: Not on file    Attends Archivist Meetings: Not on file    Marital Status: Married     Review of Systems   Constitutional:  Negative for appetite change and unexpected weight change.  HENT:  Negative for congestion, sinus pressure and sore throat.   Eyes:  Negative for pain and visual disturbance.  Respiratory:  Negative for cough, chest tightness and shortness of breath.   Cardiovascular:  Negative for chest pain, palpitations and leg swelling.  Gastrointestinal:  Negative for abdominal pain, diarrhea, nausea and vomiting.  Genitourinary:  Negative for difficulty urinating and dysuria.  Musculoskeletal:  Negative for joint swelling and myalgias.  Skin:  Negative for color change and rash.  Neurological:  Negative for dizziness, light-headedness and headaches.  Hematological:  Negative for adenopathy. Does not bruise/bleed easily.  Psychiatric/Behavioral:  Negative for agitation and dysphoric mood.        Objective:     BP 102/60 (BP Location: Left Arm, Patient Position: Sitting, Cuff Size: Normal)   Pulse 70   Temp (!) 97.3 F (36.3 C) (Oral)   Resp (!) 21   Ht '5\' 6"'$  (1.676 m)   Wt 154 lb 6.4 oz (70 kg)   SpO2 99%   BMI 24.92 kg/m  Wt Readings from Last 3 Encounters:  10/24/21 154 lb 6.4 oz (70 kg)  08/31/21 155 lb (70.3 kg)  06/23/21 155 lb 12.8 oz (70.7 kg)    Physical Exam Vitals reviewed.  Constitutional:      General: She is not in acute distress.    Appearance: Normal appearance. She is well-developed.  HENT:     Head: Normocephalic and atraumatic.     Right Ear: External ear normal.     Left Ear: External ear normal.  Eyes:     General: No scleral icterus.       Right eye: No discharge.        Left eye: No discharge.     Conjunctiva/sclera: Conjunctivae normal.  Neck:     Thyroid: No thyromegaly.  Cardiovascular:     Rate and Rhythm: Normal rate and regular rhythm.  Pulmonary:     Effort: No tachypnea, accessory muscle usage or respiratory distress.     Breath sounds: Normal breath sounds. No decreased breath sounds or wheezing.  Chest:  Breasts:     Right: No inverted nipple, mass, nipple discharge or tenderness (no axillary adenopathy).     Left: No inverted nipple, mass, nipple discharge or tenderness (no axilarry adenopathy).  Abdominal:     General: Bowel sounds are normal.     Palpations: Abdomen is soft.     Tenderness: There  is no abdominal tenderness.  Musculoskeletal:        General: No swelling or tenderness.     Cervical back: Neck supple.  Lymphadenopathy:     Cervical: No cervical adenopathy.  Skin:    Findings: No erythema or rash.  Neurological:     Mental Status: She is alert and oriented to person, place, and time.  Psychiatric:        Mood and Affect: Mood normal.        Behavior: Behavior normal.      Outpatient Encounter Medications as of 10/24/2021  Medication Sig   Cholecalciferol 25 MCG (1000 UT) tablet Take 4,000 Units by mouth daily.   Coenzyme Q10 100 MG TABS Take 1 tablet (100 mg total) by mouth daily.   cyanocobalamin 1000 MCG tablet Take by mouth.   atenolol (TENORMIN) 25 MG tablet Take 1 tablet (25 mg total) by mouth daily.   rosuvastatin (CRESTOR) 5 MG tablet Take 1 tablet (5 mg total) by mouth daily.   [DISCONTINUED] alendronate (FOSAMAX) 70 MG tablet Take 70 mg by mouth once a week. (Patient not taking: Reported on 10/24/2021)   [DISCONTINUED] atenolol (TENORMIN) 25 MG tablet TAKE 1 TABLET BY MOUTH DAILY   [DISCONTINUED] rosuvastatin (CRESTOR) 5 MG tablet TAKE 1 TABLET BY MOUTH DAILY   No facility-administered encounter medications on file as of 10/24/2021.     Lab Results  Component Value Date   WBC 7.1 02/15/2021   HGB 13.5 02/15/2021   HCT 40.3 02/15/2021   PLT 242.0 02/15/2021   GLUCOSE 95 10/19/2021   CHOL 148 10/19/2021   TRIG 199.0 (H) 10/19/2021   HDL 46.60 10/19/2021   LDLDIRECT 96.0 09/13/2020   LDLCALC 61 10/19/2021   ALT 16 10/19/2021   AST 16 10/19/2021   NA 141 10/19/2021   K 4.0 10/19/2021   CL 104 10/19/2021   CREATININE 0.98 10/19/2021   BUN 15 10/19/2021   CO2  28 10/19/2021   TSH 1.42 02/15/2021   HGBA1C 5.3 01/09/2019    MM DIAG BREAST TOMO UNI RIGHT  Result Date: 07/11/2021 CLINICAL DATA:  76 year old female presenting with right breast thickness that she first noticed several weeks ago, near her scar site. Patient has a history of right breast cancer status post lumpectomy and radiation in 2015. EXAM: DIGITAL DIAGNOSTIC UNILATERAL RIGHT MAMMOGRAM WITH TOMOSYNTHESIS AND CAD; ULTRASOUND RIGHT BREAST LIMITED TECHNIQUE: Right digital diagnostic mammography and breast tomosynthesis was performed. The images were evaluated with computer-aided detection.; Targeted ultrasound examination of the right breast was performed COMPARISON:  Previous exam(s). ACR Breast Density Category c: The breast tissue is heterogeneously dense, which may obscure small masses. FINDINGS: Mammogram: Right breast: Full field tomosynthesis views of the right breast were performed. There are stable postsurgical changes in the central superior right breast. There is no new finding in the upper outer right breast at the site of concern reported by the patient. On physical exam of the upper-outer right breast I do not feel a discrete mass or focal thickening. There is no skin erythema. Ultrasound: Targeted ultrasound is performed throughout the upper-outer quadrant of the right breast at the area of concern reported by the patient from 9:00 to 12:00 demonstrating no cystic or solid mass. IMPRESSION: No mammographic or sonographic evidence of malignancy at the site of concern reported by the patient in the upper outer right breast. RECOMMENDATION: 1. Recommend any further workup of the palpable area in the right breast be on a clinical basis. 2. Return for  routine annual screening mammography which will be due in September 2023. I have discussed the findings and recommendations with the patient. If applicable, a reminder letter will be sent to the patient regarding the next appointment. BI-RADS  CATEGORY  1: Negative. Electronically Signed   By: Audie Pinto M.D.   On: 07/11/2021 15:13  US BREAST LTD UNI RIGHT INC AXILLA  Result Date: 07/11/2021 CLINICAL DATA:  76 year old female presenting with right breast thickness that she first noticed several weeks ago, near her scar site. Patient has a history of right breast cancer status post lumpectomy and radiation in 2015. EXAM: DIGITAL DIAGNOSTIC UNILATERAL RIGHT MAMMOGRAM WITH TOMOSYNTHESIS AND CAD; ULTRASOUND RIGHT BREAST LIMITED TECHNIQUE: Right digital diagnostic mammography and breast tomosynthesis was performed. The images were evaluated with computer-aided detection.; Targeted ultrasound examination of the right breast was performed COMPARISON:  Previous exam(s). ACR Breast Density Category c: The breast tissue is heterogeneously dense, which may obscure small masses. FINDINGS: Mammogram: Right breast: Full field tomosynthesis views of the right breast were performed. There are stable postsurgical changes in the central superior right breast. There is no new finding in the upper outer right breast at the site of concern reported by the patient. On physical exam of the upper-outer right breast I do not feel a discrete mass or focal thickening. There is no skin erythema. Ultrasound: Targeted ultrasound is performed throughout the upper-outer quadrant of the right breast at the area of concern reported by the patient from 9:00 to 12:00 demonstrating no cystic or solid mass. IMPRESSION: No mammographic or sonographic evidence of malignancy at the site of concern reported by the patient in the upper outer right breast. RECOMMENDATION: 1. Recommend any further workup of the palpable area in the right breast be on a clinical basis. 2. Return for routine annual screening mammography which will be due in September 2023. I have discussed the findings and recommendations with the patient. If applicable, a reminder letter will be sent to the patient  regarding the next appointment. BI-RADS CATEGORY  1: Negative. Electronically Signed   By: Audie Pinto M.D.   On: 07/11/2021 15:13      Assessment & Plan:   Problem List Items Addressed This Visit     Breast pain, right    Resolved.  Mammogram as outlined - ok.       CKD (chronic kidney disease) stage 3, GFR 30-59 ml/min (HCC)    Avoid antiinflammatories.  Stay hydrated.  GFR just checked - 55. Follow metabolic panel.       Ductal carcinoma in situ (DCIS) of right breast    S/p lumpectomy - right breast.  S/p XRT. ER/PR positive.  Mammogram 12/21/2020 - [facility].   Recent right breast pain - resolved.  Right breast mammogram and ultrasound 07/11/21 - [facility].  She will call to schedule her screening mammogram.        Healthcare maintenance    Physical today 10/24/21.  Mammogram - 12/01/20 - [facility].  Right breast diagnostic mammogram in 07/2021.  Ok.  Recommended continue bilateral screening.  She will call to schedule. Colonoscopy 05/2017 - normal.       History of colon polyps    Colonoscopy 05/2017 - clear.  Follow.       Hypercholesteremia    Tolerating Crestor.  Continue low-cholesterol diet and exercise.  Follow-up lipid panel liver function test.  Lab Results  Component Value Date   CHOL 148 10/19/2021   HDL 46.60 10/19/2021  LDLCALC 61 10/19/2021   LDLDIRECT 96.0 09/13/2020   TRIG 199.0 (H) 10/19/2021   CHOLHDL 3 10/19/2021       Relevant Medications   atenolol (TENORMIN) 25 MG tablet   rosuvastatin (CRESTOR) 5 MG tablet   Other Relevant Orders   Basic Metabolic Panel (BMET)   Lipid Profile   Hepatic function panel   CBC with Differential/Platelet   TSH   osteoporosis    reclast 08/30/21 - followed by endocrinology.       Supraventricular tachycardia ([place])    Has done well on atenolol.  Continue current dose.  Follow.  She feels is stable.       Relevant Medications   atenolol (TENORMIN) 25 MG tablet   rosuvastatin (CRESTOR) 5 MG tablet   Other  Visit Diagnoses     Routine general medical examination at a health care facility    -  Primary   Encounter for screening mammogram for malignant neoplasm of breast       Relevant Orders   MM 3D SCREEN BREAST BILATERAL        Einar Pheasant, MD

## 2021-10-24 NOTE — Assessment & Plan Note (Addendum)
Physical today 10/24/21.  Mammogram - 12/01/20 - [facility].  Right breast diagnostic mammogram in 07/2021.  Ok.  Recommended continue bilateral screening.  She will call to schedule. Colonoscopy 05/2017 - normal.

## 2021-10-30 ENCOUNTER — Encounter: Payer: Self-pay | Admitting: Internal Medicine

## 2021-10-30 NOTE — Assessment & Plan Note (Signed)
Resolved.  Mammogram as outlined - ok.

## 2021-10-30 NOTE — Assessment & Plan Note (Signed)
Avoid antiinflammatories.  Stay hydrated.  GFR just checked - 55. Follow metabolic panel.

## 2021-10-30 NOTE — Assessment & Plan Note (Signed)
S/p lumpectomy - right breast.  S/p XRT. ER/PR positive.  Mammogram 12/21/2020 - [facility].   Recent right breast pain - resolved.  Right breast mammogram and ultrasound 07/11/21 - [facility].  She will call to schedule her screening mammogram.

## 2021-10-30 NOTE — Assessment & Plan Note (Signed)
Has done well on atenolol.  Continue current dose.  Follow.  She feels is stable.  

## 2021-10-30 NOTE — Assessment & Plan Note (Signed)
Tolerating Crestor.  Continue low-cholesterol diet and exercise.  Follow-up lipid panel liver function test.  Lab Results  Component Value Date   CHOL 148 10/19/2021   HDL 46.60 10/19/2021   LDLCALC 61 10/19/2021   LDLDIRECT 96.0 09/13/2020   TRIG 199.0 (H) 10/19/2021   CHOLHDL 3 10/19/2021

## 2021-10-30 NOTE — Assessment & Plan Note (Signed)
reclast 08/30/21 - followed by endocrinology.  

## 2021-10-30 NOTE — Assessment & Plan Note (Signed)
Colonoscopy 05/2017 - clear.  Follow.  

## 2021-12-18 ENCOUNTER — Ambulatory Visit
Admission: RE | Admit: 2021-12-18 | Discharge: 2021-12-18 | Disposition: A | Discharge status: Home / Self Care | Payer: Medicare PPO | Source: Ambulatory Visit | Attending: Internal Medicine | Admitting: Internal Medicine

## 2021-12-18 DIAGNOSIS — Z1231 Encounter for screening mammogram for malignant neoplasm of breast: Secondary | ICD-10-CM | POA: Insufficient documentation

## 2022-02-21 ENCOUNTER — Encounter: Payer: Self-pay | Admitting: Family Medicine

## 2022-02-21 ENCOUNTER — Ambulatory Visit (INDEPENDENT_AMBULATORY_CARE_PROVIDER_SITE_OTHER): Payer: Medicare PPO | Admitting: Family Medicine

## 2022-02-21 VITALS — BP 116/74 | HR 80 | Temp 98.4°F | Ht 66.0 in | Wt 145.2 lb

## 2022-02-21 DIAGNOSIS — J069 Acute upper respiratory infection, unspecified: Secondary | ICD-10-CM | POA: Insufficient documentation

## 2022-02-21 LAB — POC COVID19 BINAXNOW: SARS Coronavirus 2 Ag: NEGATIVE

## 2022-02-21 MED ORDER — BENZONATATE 200 MG PO CAPS: 200.0000 mg | ORAL_CAPSULE | Freq: Two times a day (BID) | ORAL | 0 refills | Status: DC | PRN

## 2022-02-21 NOTE — Assessment & Plan Note (Addendum)
Symptoms are likely related to viral upper respiratory illness.  No signs of strep throat on her exam.  She will be swabbed for COVID.  Deferring flu testing given that she is outside of the 48-hour window for benefit from Tamiflu and she is already feeling better.  Discussed that a viral illness would not respond to an antibiotic and thus that would not be worthwhile at this point.  Discussed continued use of Claritin and Tylenol as well as moisturizing eyedrops.  Discussed use of warm compresses for her conjunctivitis symptoms.  COVID PCR test sent out and she was advised to remain at home and away from others until that test result returns.  Advised if she had to be around anyone else while we await that test result she needs to wear a mask.  If she develops any worsening symptoms or develops congestion, fever, or new symptoms she should be reevaluated. Tessalon for cough.

## 2022-02-21 NOTE — Patient Instructions (Signed)
Nice to see you. You likely have a virus.  This will take 7 to 10 days to fully resolve.  You can continue Claritin, Tylenol, and moisturizing eyedrops for your symptoms.  Once your symptoms have improved you should discard the eyedrops. If you develop any worsening symptoms or you develop any congestion, fevers, or new symptoms I would suggest being reevaluated. You should stay home and try to stay away from others until we get your COVID PCR test result back.  If you do have to be around anyone else while we await that test result please wear a mask.

## 2022-02-21 NOTE — Progress Notes (Signed)
Claudia Rumps, MD Phone: [phone]  Claudia Little is a 76 y.o. female who presents today for same-day visit.  Headache: Patient notes she woke up Sunday with a bad headache and sore throat.  He got better as the day went on though at night and into Monday she had a headache and sore throat that recurred.  She has had some cough.  No congestion or rhinorrhea.  No known postnasal drip.  No shortness of breath, fevers, taste disturbances, or smell disturbances.  No known COVID exposures or flu exposure.  She notes her eyes have been bothering her with gunk and matting.  She also notes some tinnitus initially though that has resolved.  She notes no ear fullness.  She has been using Claritin, Tylenol, and eyedrops.  Social History   Tobacco Use  Smoking Status Never  Smokeless Tobacco Former    Current Outpatient Medications on File Prior to Visit  Medication Sig Dispense Refill   atenolol (TENORMIN) 25 MG tablet Take 1 tablet (25 mg total) by mouth daily. 90 tablet 1   Cholecalciferol 25 MCG (1000 UT) tablet Take 4,000 Units by mouth daily.     Coenzyme Q10 100 MG TABS Take 1 tablet (100 mg total) by mouth daily. 30 tablet 0   cyanocobalamin 1000 MCG tablet Take by mouth.     rosuvastatin (CRESTOR) 5 MG tablet Take 1 tablet (5 mg total) by mouth daily. 90 tablet 1   No current facility-administered medications on file prior to visit.     ROS see history of present illness  Objective  Physical Exam Vitals:   02/21/22 1348  BP: 116/74  Pulse: 80  Temp: 98.4 F (36.9 C)  SpO2: 99%    BP Readings from Last 3 Encounters:  02/21/22 116/74  10/24/21 102/60  06/23/21 118/70   Wt Readings from Last 3 Encounters:  02/21/22 145 lb 3.2 oz (65.9 kg)  10/24/21 154 lb 6.4 oz (70 kg)  08/31/21 155 lb (70.3 kg)    Physical Exam Constitutional:      General: She is not in acute distress.    Appearance: She is not diaphoretic.  HENT:     Right Ear: Tympanic membrane normal.      Left Ear: Tympanic membrane normal.     Mouth/Throat:     Mouth: Mucous membranes are moist.     Pharynx: Oropharynx is clear. No oropharyngeal exudate.  Eyes:     Comments: Slight conjunctival erythema with wateriness  Cardiovascular:     Rate and Rhythm: Normal rate and regular rhythm.     Heart sounds: Normal heart sounds.  Pulmonary:     Effort: Pulmonary effort is normal.     Breath sounds: Normal breath sounds.  Lymphadenopathy:     Cervical: No cervical adenopathy.  Skin:    General: Skin is warm and dry.  Neurological:     Mental Status: She is alert.      Assessment/Plan: Please see individual problem list.  Problem List Items Addressed This Visit     Viral upper respiratory illness - Primary    Symptoms are likely related to viral upper respiratory illness.  No signs of strep throat on her exam.  She will be swabbed for COVID.  Deferring flu testing given that she is outside of the 48-hour window for benefit from Tamiflu and she is already feeling better.  Discussed that a viral illness would not respond to an antibiotic and thus that would not be worthwhile at  this point.  Discussed continued use of Claritin and Tylenol as well as moisturizing eyedrops.  Discussed use of warm compresses for her conjunctivitis symptoms.  COVID PCR test sent out and she was advised to remain at home and away from others until that test result returns.  Advised if she had to be around anyone else while we await that test result she needs to wear a mask.  If she develops any worsening symptoms or develops congestion, fever, or new symptoms she should be reevaluated. Tessalon for cough.       Relevant Medications   benzonatate (TESSALON) 200 MG capsule   Other Relevant Orders   POC COVID-19   Novel Coronavirus, NAA ([facility])     Return if symptoms worsen or fail to improve.   Claudia Rumps, MD [place]

## 2022-02-22 LAB — NOVEL CORONAVIRUS, NAA: SARS-CoV-2, NAA: NOT DETECTED

## 2022-02-26 ENCOUNTER — Other Ambulatory Visit (INDEPENDENT_AMBULATORY_CARE_PROVIDER_SITE_OTHER): Payer: Medicare PPO

## 2022-02-26 ENCOUNTER — Other Ambulatory Visit: Payer: Medicare PPO

## 2022-02-26 DIAGNOSIS — E7800 Pure hypercholesterolemia, unspecified: Secondary | ICD-10-CM | POA: Diagnosis not present

## 2022-02-26 LAB — LIPID PANEL
Cholesterol: 142 mg/dL (ref 0–200)
HDL: 36.9 mg/dL — ABNORMAL LOW (ref >39.00)
LDL Cholesterol: 74 mg/dL (ref 0–99)
NonHDL: 105.17
Total CHOL/HDL Ratio: 4
Triglycerides: 155 mg/dL — ABNORMAL HIGH (ref 0.0–149.0)
VLDL: 31 mg/dL (ref 0.0–40.0)

## 2022-02-26 LAB — CBC WITH DIFFERENTIAL/PLATELET
Basophils Absolute: 0.1 10*3/uL (ref 0.0–0.1)
Basophils Relative: 0.7 % (ref 0.0–3.0)
Eosinophils Absolute: 0.5 10*3/uL (ref 0.0–0.7)
Eosinophils Relative: 5.6 % — ABNORMAL HIGH (ref 0.0–5.0)
HCT: 38.3 % (ref 36.0–46.0)
Hemoglobin: 12.7 g/dL (ref 12.0–15.0)
Lymphocytes Relative: 22.2 % (ref 12.0–46.0)
Lymphs Abs: 1.8 10*3/uL (ref 0.7–4.0)
MCHC: 33.2 g/dL (ref 30.0–36.0)
MCV: 92.6 fl (ref 78.0–100.0)
Monocytes Absolute: 0.9 10*3/uL (ref 0.1–1.0)
Monocytes Relative: 10.4 % (ref 3.0–12.0)
Neutro Abs: 5.1 10*3/uL (ref 1.4–7.7)
Neutrophils Relative %: 61.1 % (ref 43.0–77.0)
Platelets: 329 10*3/uL (ref 150.0–400.0)
RBC: 4.14 Mil/uL (ref 3.87–5.11)
RDW: 14.2 % (ref 11.5–15.5)
WBC: 8.3 10*3/uL (ref 4.0–10.5)

## 2022-02-26 LAB — BASIC METABOLIC PANEL
BUN: 15 mg/dL (ref 6–23)
CO2: 27 mEq/L (ref 19–32)
Calcium: 8.7 mg/dL (ref 8.4–10.5)
Chloride: 101 mEq/L (ref 96–112)
Creatinine, Ser: 0.95 mg/dL (ref 0.40–1.20)
GFR: 57.81 mL/min — ABNORMAL LOW (ref >60.00)
Glucose, Bld: 95 mg/dL (ref 70–99)
Potassium: 3.8 mEq/L (ref 3.5–5.1)
Sodium: 137 mEq/L (ref 135–145)

## 2022-02-26 LAB — TSH: TSH: 1.06 u[IU]/mL (ref 0.35–5.50)

## 2022-02-26 LAB — HEPATIC FUNCTION PANEL
ALT: 14 U/L (ref 0–35)
AST: 13 U/L (ref 0–37)
Albumin: 4.1 g/dL (ref 3.5–5.2)
Alkaline Phosphatase: 62 U/L (ref 39–117)
Bilirubin, Direct: 0.1 mg/dL (ref 0.0–0.3)
Total Bilirubin: 0.5 mg/dL (ref 0.2–1.2)
Total Protein: 7 g/dL (ref 6.0–8.3)

## 2022-03-01 ENCOUNTER — Ambulatory Visit: Payer: Medicare PPO | Admitting: Internal Medicine

## 2022-03-01 ENCOUNTER — Encounter: Payer: Self-pay | Admitting: Internal Medicine

## 2022-03-01 VITALS — BP 124/72 | HR 68 | Temp 97.9°F | Resp 15 | Ht 66.0 in | Wt 145.8 lb

## 2022-03-01 DIAGNOSIS — M8580 Other specified disorders of bone density and structure, unspecified site: Secondary | ICD-10-CM

## 2022-03-01 DIAGNOSIS — G4700 Insomnia, unspecified: Secondary | ICD-10-CM

## 2022-03-01 DIAGNOSIS — N1831 Chronic kidney disease, stage 3a: Secondary | ICD-10-CM

## 2022-03-01 DIAGNOSIS — I471 Supraventricular tachycardia, unspecified: Secondary | ICD-10-CM

## 2022-03-01 DIAGNOSIS — R197 Diarrhea, unspecified: Secondary | ICD-10-CM

## 2022-03-01 DIAGNOSIS — E7800 Pure hypercholesterolemia, unspecified: Secondary | ICD-10-CM

## 2022-03-01 DIAGNOSIS — F419 Anxiety disorder, unspecified: Secondary | ICD-10-CM | POA: Diagnosis not present

## 2022-03-01 DIAGNOSIS — Z86010 Personal history of colonic polyps: Secondary | ICD-10-CM

## 2022-03-01 DIAGNOSIS — D0511 Intraductal carcinoma in situ of right breast: Secondary | ICD-10-CM | POA: Diagnosis not present

## 2022-03-01 MED ORDER — SERTRALINE HCL 50 MG PO TABS: ORAL_TABLET | ORAL | 2 refills | Status: DC

## 2022-03-01 NOTE — Progress Notes (Signed)
Patient ID: Claudia Little, female   DOB: 17-Jun-1945, 76 y.o.   MRN: [id number]   Subjective:    Patient ID: Claudia Little, female    DOB: Oct 01, 1945, 76 y.o.   MRN: [id number]   Patient here for  Chief Complaint  Patient presents with   Follow-up   .   HPI Here to follow up regarding her cholesterol.  Has history of SVT.  Has done well on atenolol.  Recently evaluated by Dr Caryl Bis - diagnosed with viral URI.  Covid PCR negative. Feeling better.  No persistent increased cough or congestion.  Has had increased stress wit her husband's medical issues and her son's medical issues.  Discussed.  Feels needs something to help level things out.  Issues with sleep - staying asleep.  Also reports left arm/hand - feels like it is asleep/tingling.  Intermittent.  Notices more at night.  Up moving around/shakes arm out - better.  No chest pain reported.  No abdominal pain reported.  Intermittent flares of loose stool.     Past Medical History:  Diagnosis Date   Breast cancer ([place]) 12/2013   right breast cancer   Chronic anxiety    Dysrhythmia    SVT   History of kidney stones    Insomnia    Nephrolithiasis    Osteoporosis    PAC (premature atrial contraction)    Palpitations    Personal history of radiation therapy 2015   Right breast   SVT (supraventricular tachycardia)    Past Surgical History:  Procedure Laterality Date   BREAST CYST ASPIRATION Left 10+ yrs ago   neg   BREAST EXCISIONAL BIOPSY Right 12/15/2013   DCIS   BREAST LUMPECTOMY     BREAST SURGERY Right 12/15/2013   lumpectomy   BUNIONECTOMY  early '70s   COLONOSCOPY WITH PROPOFOL N/A 05/27/2017   Procedure: COLONOSCOPY WITH PROPOFOL;  Surgeon: Lollie Sails, MD;  Location: [facility] ENDOSCOPY;  Service: Endoscopy;  Laterality: N/A;   DILATION AND CURETTAGE OF UTERUS  1973   after miscarriage   LITHOTRIPSY     Family History  Problem Relation Age of Onset   Aortic stenosis Mother    Congestive Heart Failure Mother     Aortic stenosis Father    Coronary artery disease Father        Deceased from complications of atherosclerotic CAD and Atherosclerosis   Congestive Heart Failure Father    Parkinsonism Brother    Colon polyps Sister    Arthritis Maternal Grandfather    Early death Paternal Grandmother    Heart block Brother    Stroke Maternal Grandmother    Kidney disease Paternal Grandfather    Breast cancer Maternal Aunt 70   Ovarian cancer Maternal Aunt    Social History   Socioeconomic History   Marital status: Married    Spouse name: Richard   Number of children: 1   Years of education: HS   Highest education level: Not on file  Occupational History   Occupation: Retired  Tobacco Use   Smoking status: Never   Smokeless tobacco: Former  Substance and Sexual Activity   Alcohol use: Yes    Alcohol/week: 7.0 standard drinks of alcohol    Types: 7 Glasses of wine per week   Drug use: No   Sexual activity: Not Currently  Other Topics Concern   Not on file  Social History Narrative   Not on file   Social Determinants of Health   Financial Resource Strain:  Low Risk  (08/31/2021)   Overall Financial Resource Strain (CARDIA)    Difficulty of Paying Living Expenses: Not hard at all  Food Insecurity: No Food Insecurity (08/31/2021)   Hunger Vital Sign    Worried About Running Out of Food in the Last Year: Never true    Ran Out of Food in the Last Year: Never true  Transportation Needs: No Transportation Needs (08/31/2021)   PRAPARE - Hydrologist (Medical): No    Lack of Transportation (Non-Medical): No  Physical Activity: Insufficiently Active (08/31/2021)   Exercise Vital Sign    Days of Exercise per Week: 4 days    Minutes of Exercise per Session: 20 min  Stress: No Stress Concern Present (08/31/2021)   [place]    Feeling of Stress : Not at all  Social Connections: Unknown (08/31/2021)    Social Connection and Isolation Panel [NHANES]    Frequency of Communication with Friends and Family: Not on file    Frequency of Social Gatherings with Friends and Family: Not on file    Attends Religious Services: Not on file    Active Member of Clubs or Organizations: Not on file    Attends Archivist Meetings: Not on file    Marital Status: Married     Review of Systems  Constitutional:  Negative for appetite change and unexpected weight change.  HENT:  Negative for congestion and sinus pressure.   Respiratory:  Negative for cough, chest tightness and shortness of breath.   Cardiovascular:  Negative for chest pain, palpitations and leg swelling.  Gastrointestinal:  Negative for abdominal pain, diarrhea, nausea and vomiting.       No diarrhea currently.  Intermittent flares - loose stool   Genitourinary:  Negative for difficulty urinating and dysuria.  Musculoskeletal:  Negative for joint swelling and myalgias.  Skin:  Negative for color change and rash.  Neurological:  Negative for dizziness and headaches.  Psychiatric/Behavioral:  Negative for agitation and dysphoric mood.        Objective:     BP 124/72 (BP Location: Left Arm, Patient Position: Sitting, Cuff Size: Small)   Pulse 68   Temp 97.9 F (36.6 C) (Temporal)   Resp 15   Ht '5\' 6"'$  (1.676 m)   Wt 145 lb 12.8 oz (66.1 kg)   SpO2 98%   BMI 23.53 kg/m  Wt Readings from Last 3 Encounters:  03/01/22 145 lb 12.8 oz (66.1 kg)  02/21/22 145 lb 3.2 oz (65.9 kg)  10/24/21 154 lb 6.4 oz (70 kg)    Physical Exam Vitals reviewed.  Constitutional:      General: She is not in acute distress.    Appearance: Normal appearance.  HENT:     Head: Normocephalic and atraumatic.     Right Ear: External ear normal.     Left Ear: External ear normal.  Eyes:     General: No scleral icterus.       Right eye: No discharge.        Left eye: No discharge.     Conjunctiva/sclera: Conjunctivae normal.  Neck:      Thyroid: No thyromegaly.  Cardiovascular:     Rate and Rhythm: Normal rate and regular rhythm.  Pulmonary:     Effort: No respiratory distress.     Breath sounds: Normal breath sounds. No wheezing.  Abdominal:     General: Bowel sounds are normal.  Palpations: Abdomen is soft.     Tenderness: There is no abdominal tenderness.  Musculoskeletal:        General: No swelling or tenderness.     Cervical back: Neck supple. No tenderness.  Lymphadenopathy:     Cervical: No cervical adenopathy.  Skin:    Findings: No erythema or rash.  Neurological:     Mental Status: She is alert.  Psychiatric:        Mood and Affect: Mood normal.        Behavior: Behavior normal.      Outpatient Encounter Medications as of 03/01/2022  Medication Sig   atenolol (TENORMIN) 25 MG tablet Take 1 tablet (25 mg total) by mouth daily.   Cholecalciferol 25 MCG (1000 UT) tablet Take 4,000 Units by mouth daily.   Coenzyme Q10 100 MG TABS Take 1 tablet (100 mg total) by mouth daily.   cyanocobalamin 1000 MCG tablet Take by mouth.   rosuvastatin (CRESTOR) 5 MG tablet Take 1 tablet (5 mg total) by mouth daily.   sertraline (ZOLOFT) 50 MG tablet 1/2 tablet q day x 1 week and then one po q day.   [DISCONTINUED] benzonatate (TESSALON) 200 MG capsule Take 1 capsule (200 mg total) by mouth 2 (two) times daily as needed for cough.   No facility-administered encounter medications on file as of 03/01/2022.     Lab Results  Component Value Date   WBC 8.3 02/26/2022   HGB 12.7 02/26/2022   HCT 38.3 02/26/2022   PLT 329.0 02/26/2022   GLUCOSE 95 02/26/2022   CHOL 142 02/26/2022   TRIG 155.0 (H) 02/26/2022   HDL 36.90 (L) 02/26/2022   LDLDIRECT 96.0 09/13/2020   LDLCALC 74 02/26/2022   ALT 14 02/26/2022   AST 13 02/26/2022   NA 137 02/26/2022   K 3.8 02/26/2022   CL 101 02/26/2022   CREATININE 0.95 02/26/2022   BUN 15 02/26/2022   CO2 27 02/26/2022   TSH 1.06 02/26/2022   HGBA1C 5.3 01/09/2019    MM  3D SCREEN BREAST BILATERAL  Result Date: 12/19/2021 CLINICAL DATA:  Screening. EXAM: DIGITAL SCREENING BILATERAL MAMMOGRAM WITH TOMOSYNTHESIS AND CAD TECHNIQUE: Bilateral screening digital craniocaudal and mediolateral oblique mammograms were obtained. Bilateral screening digital breast tomosynthesis was performed. The images were evaluated with computer-aided detection. COMPARISON:  Previous exam(s). ACR Breast Density Category c: The breast tissue is heterogeneously dense, which may obscure small masses. FINDINGS: There are no findings suspicious for malignancy. IMPRESSION: No mammographic evidence of malignancy. A result letter of this screening mammogram will be mailed directly to the patient. RECOMMENDATION: Screening mammogram in one year. (Code:SM-B-01Y) BI-RADS CATEGORY  1: Negative. Electronically Signed   By: Fidela Salisbury M.D.   On: 12/19/2021 13:23       Assessment & Plan:   Problem List Items Addressed This Visit     Anxiety - Primary    Discussed increased stress and anxiety.  Discussed sleeping issues.  Discussed trial of zoloft.  Follow closely.  Call with update.       Relevant Medications   sertraline (ZOLOFT) 50 MG tablet   CKD (chronic kidney disease) stage 3, GFR 30-59 ml/min (HCC)    Avoid antiinflammatories.  Stay hydrated.  GFR just checked - 57. Follow metabolic panel.       Diarrhea    Intermittent flares. She was questioning if related to stress.  Treat with zoloft as outlined.  Consider benefiber.  Follow.       Ductal carcinoma  in situ (DCIS) of right breast    S/p lumpectomy - right breast.  S/p XRT. ER/PR positive.  Mammogram 12/21/2020 - Birads II.   Recent right breast pain - resolved.  Right breast mammogram and ultrasound 07/11/21 - Birads I.  Screening bilateral mammogram - 12/18/21 - Birads I.       History of colon polyps    Colonoscopy 05/2017 - clear.  Follow.       Hypercholesteremia    Tolerating Crestor.  Continue low-cholesterol diet and  exercise.  Follow-up lipid panel liver function test.  Lab Results  Component Value Date   CHOL 142 02/26/2022   HDL 36.90 (L) 02/26/2022   LDLCALC 74 02/26/2022   LDLDIRECT 96.0 09/13/2020   TRIG 155.0 (H) 02/26/2022   CHOLHDL 4 02/26/2022       Insomnia    Has had persistent difficulty sleeping.  Has tried hydroxyzine and trazodone.  Still with issues.  Discussed increased stress and anxiety.  Trial of zoloft as outlined.  Follow.       osteoporosis    reclast 08/30/21 - followed by endocrinology.       Supraventricular tachycardia    Has done well on atenolol.  Continue current dose.  Follow.  She feels is stable.         Einar Pheasant, MD

## 2022-03-04 ENCOUNTER — Encounter: Payer: Self-pay | Admitting: Internal Medicine

## 2022-03-04 NOTE — Assessment & Plan Note (Signed)
Avoid antiinflammatories.  Stay hydrated.  GFR just checked - 57. Follow metabolic panel.

## 2022-03-04 NOTE — Assessment & Plan Note (Signed)
Has had persistent difficulty sleeping.  Has tried hydroxyzine and trazodone.  Still with issues.  Discussed increased stress and anxiety.  Trial of zoloft as outlined.  Follow.

## 2022-03-04 NOTE — Assessment & Plan Note (Addendum)
Intermittent flares. She was questioning if related to stress.  Treat with zoloft as outlined.  Consider benefiber.  Follow.

## 2022-03-04 NOTE — Assessment & Plan Note (Signed)
Tolerating Crestor.  Continue low-cholesterol diet and exercise.  Follow-up lipid panel liver function test.  Lab Results  Component Value Date   CHOL 142 02/26/2022   HDL 36.90 (L) 02/26/2022   LDLCALC 74 02/26/2022   LDLDIRECT 96.0 09/13/2020   TRIG 155.0 (H) 02/26/2022   CHOLHDL 4 02/26/2022

## 2022-03-04 NOTE — Assessment & Plan Note (Signed)
S/p lumpectomy - right breast.  S/p XRT. ER/PR positive.  Mammogram 12/21/2020 - Birads II.   Recent right breast pain - resolved.  Right breast mammogram and ultrasound 07/11/21 - Birads I.  Screening bilateral mammogram - 12/18/21 - Birads I.

## 2022-03-04 NOTE — Assessment & Plan Note (Signed)
Has done well on atenolol.  Continue current dose.  Follow.  She feels is stable.

## 2022-03-04 NOTE — Assessment & Plan Note (Signed)
reclast 08/30/21 - followed by endocrinology.

## 2022-03-04 NOTE — Assessment & Plan Note (Signed)
Colonoscopy 05/2017 - clear.  Follow.

## 2022-03-04 NOTE — Assessment & Plan Note (Signed)
Discussed increased stress and anxiety.  Discussed sleeping issues.  Discussed trial of zoloft.  Follow closely.  Call with update.

## 2022-03-05 IMAGING — MG MM DIGITAL SCREENING BILAT W/ TOMO AND CAD
8 series · 9 of 24 positions shown · non-contrast
Comparison: Previous exam(s).

CLINICAL DATA: Screening. RIGHT lumpectomy 0773

EXAM:
DIGITAL SCREENING BILATERAL MAMMOGRAM WITH TOMOSYNTHESIS AND CAD
TECHNIQUE: Bilateral screening digital craniocaudal and mediolateral oblique
mammograms were obtained. Bilateral screening digital breast
tomosynthesis was performed. The images were evaluated with
computer-aided detection.

[L CC synth-2D]
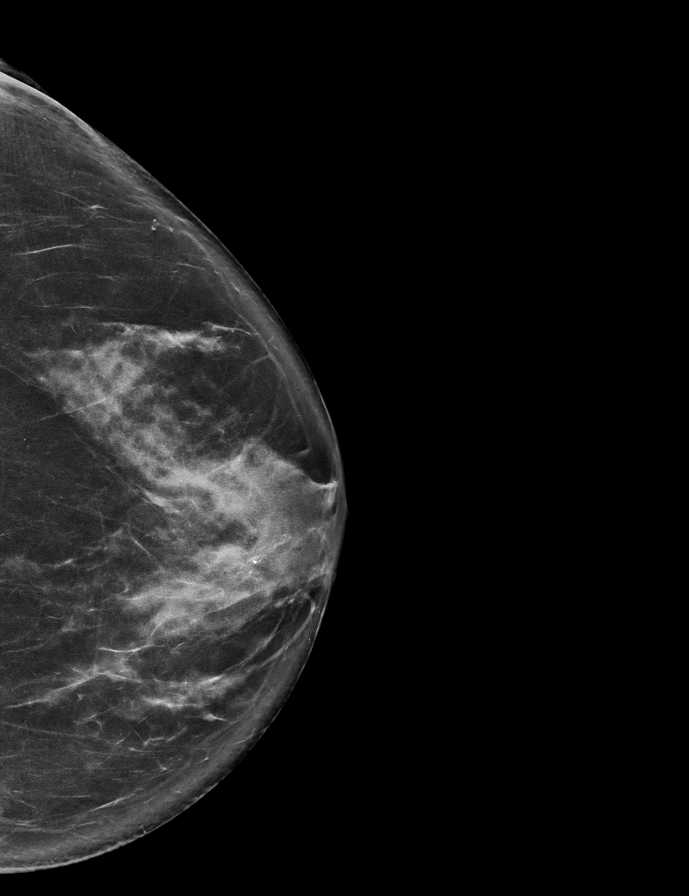

[R CC synth-2D]
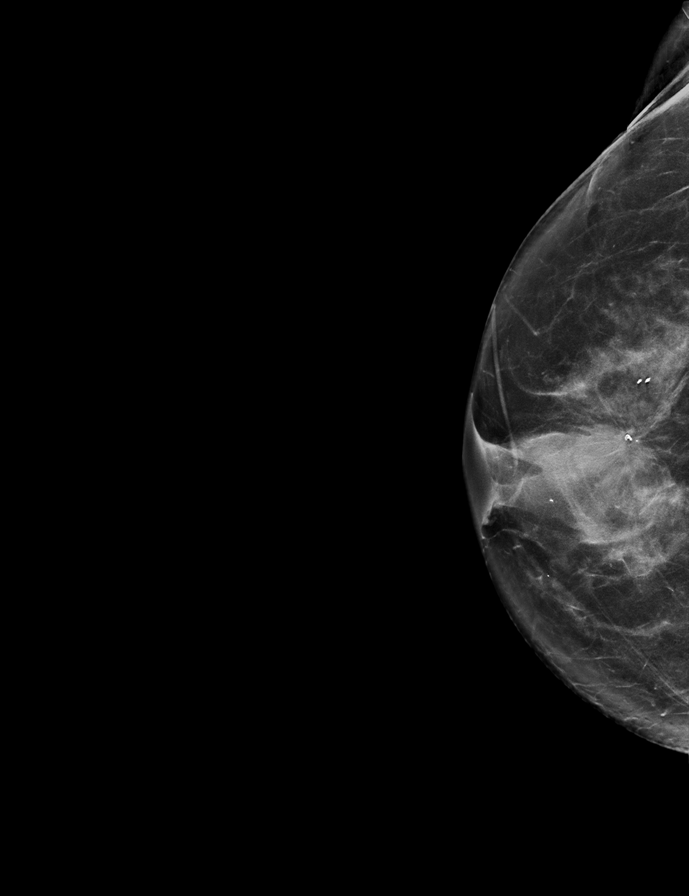

[L MLO synth-2D]
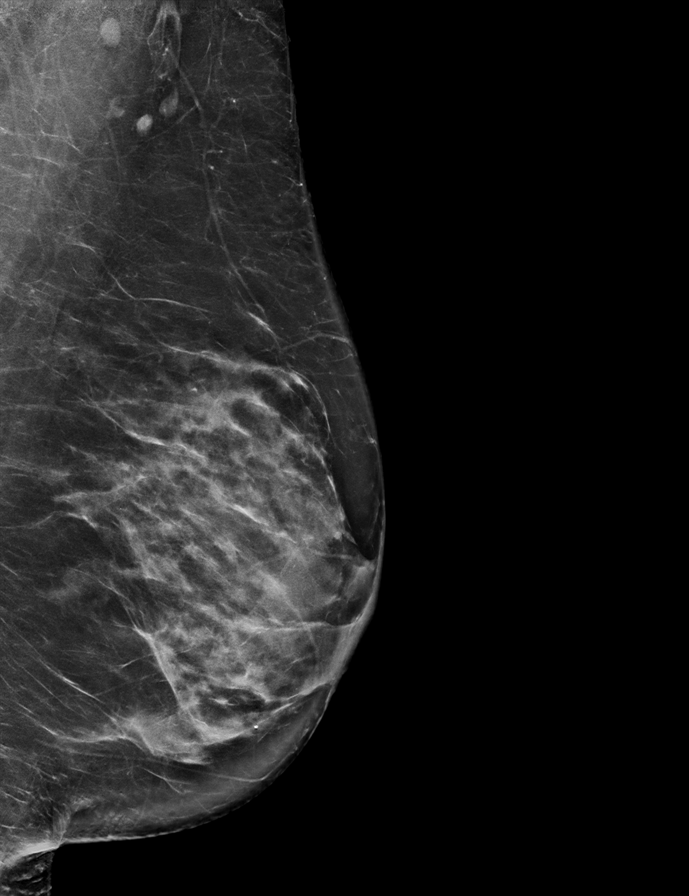

[R MLO synth-2D]
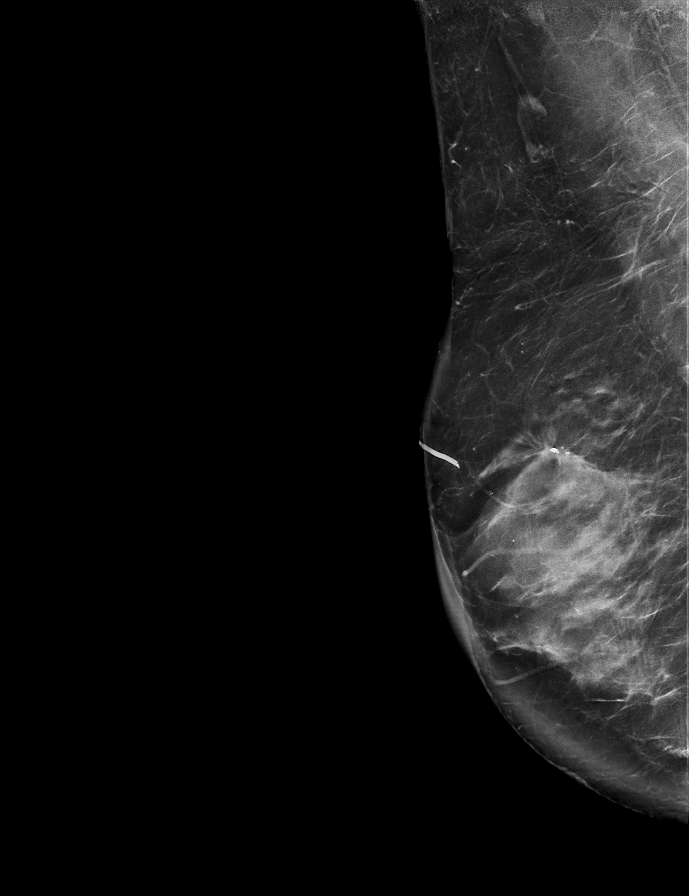

[L CC tomo · 2 of 74 frames shown]
[frame 24/74]
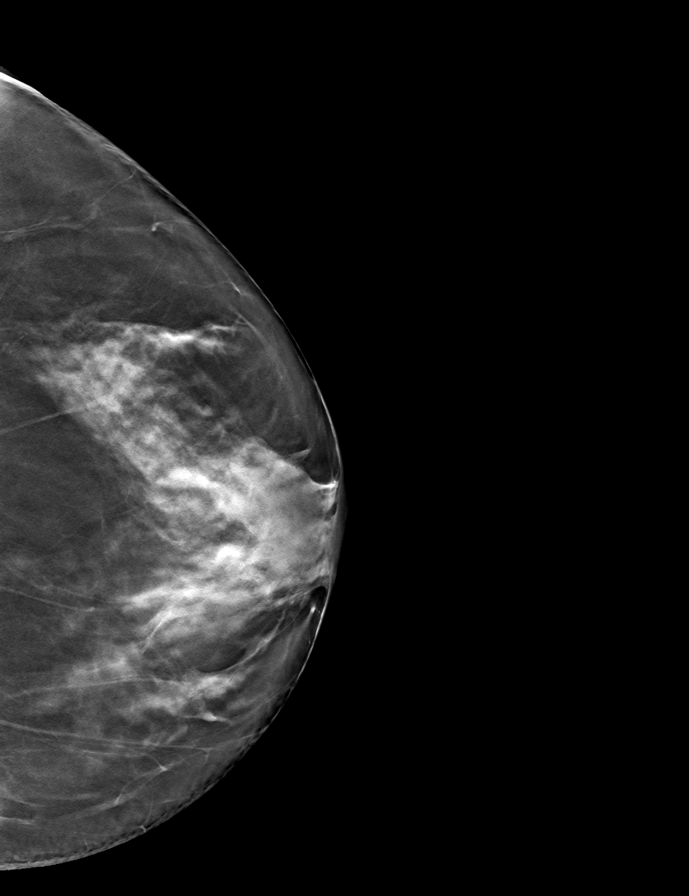
[frame 37/74]
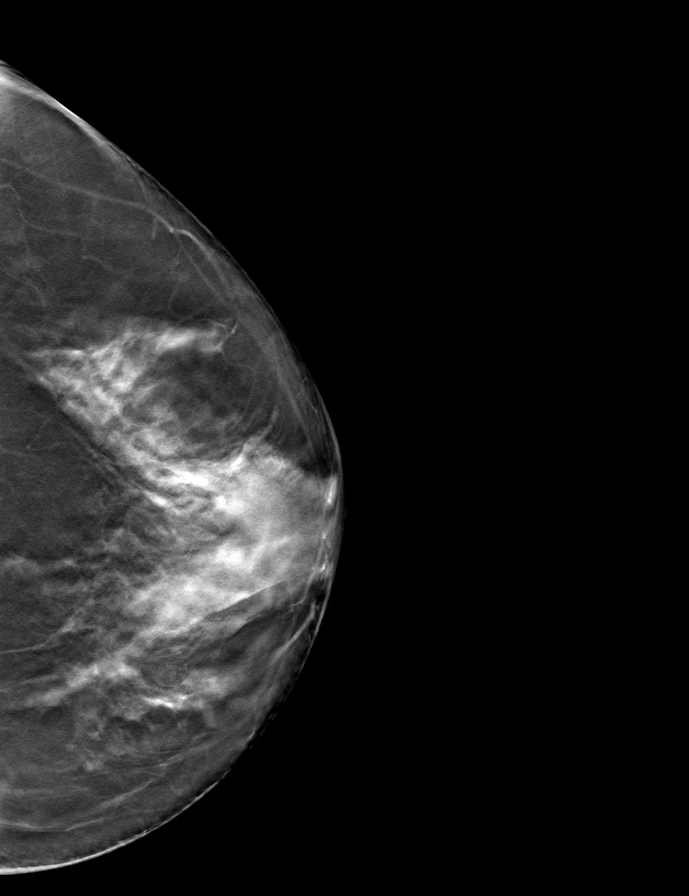

[R MLO tomo · tomo slice 43/85.0]
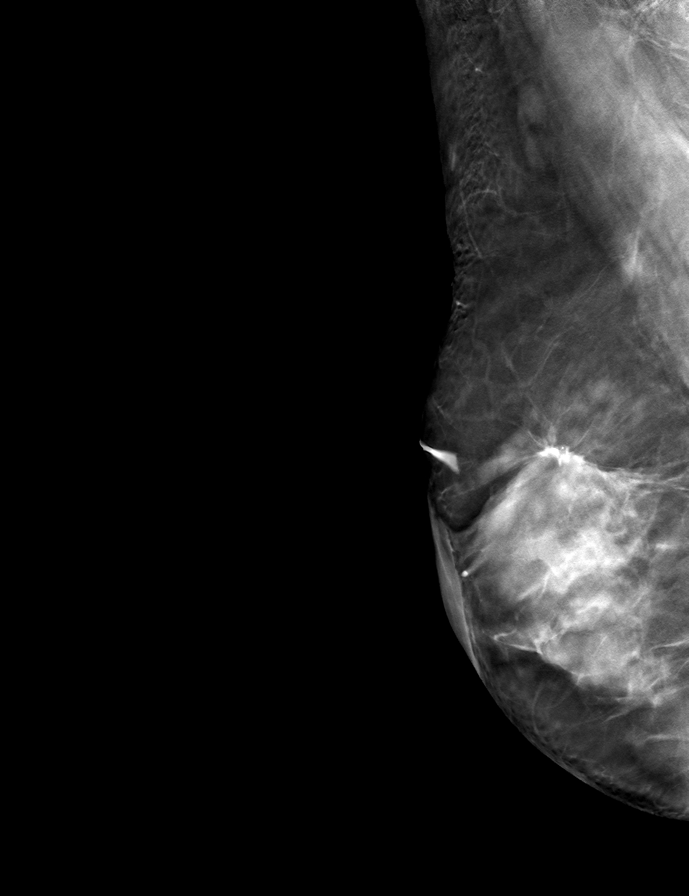

[R CC tomo · tomo slice 33/66.0]
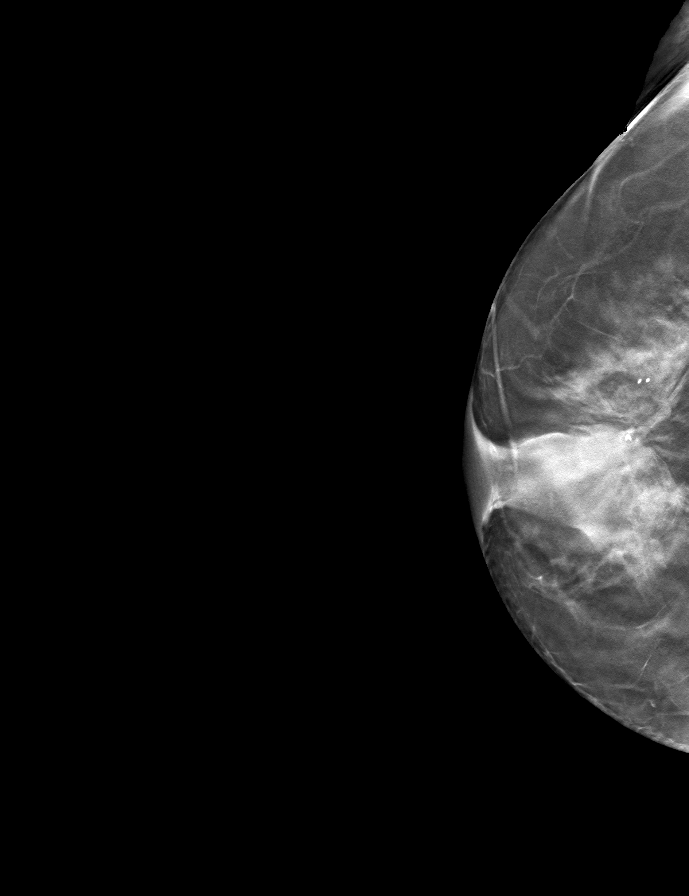

[L MLO tomo · tomo slice 39/78.0]
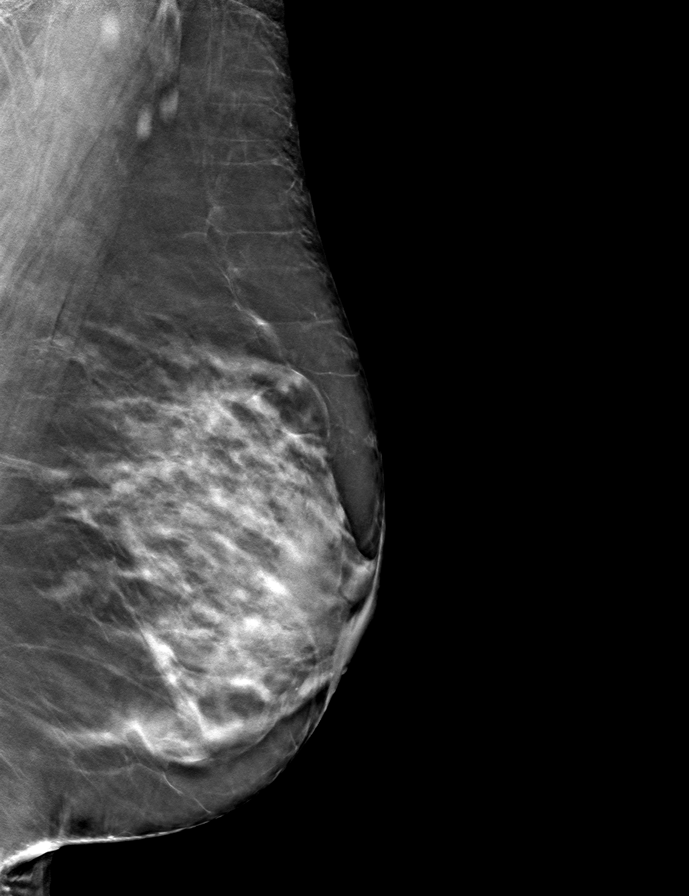

[9 of 24 positions shown; findings below may reference images not displayed]

ACR Breast Density Category c: The breast tissue is heterogeneously
dense, which may obscure small masses.
FINDINGS: There are no findings suspicious for malignancy. There is density
and architectural distortion within the RIGHT breast, consistent
with postsurgical changes. These are stable in comparison to prior.
IMPRESSION: No mammographic evidence of malignancy. A result letter of this
screening mammogram will be mailed directly to the patient.

RECOMMENDATION:
Screening mammogram in one year. (Code:H0-C-83J)

BI-RADS CATEGORY  2: Benign.

## 2022-04-20 ENCOUNTER — Encounter: Payer: Self-pay | Admitting: Internal Medicine

## 2022-04-20 ENCOUNTER — Ambulatory Visit: Payer: Medicare PPO | Admitting: Internal Medicine

## 2022-04-20 VITALS — BP 112/60 | HR 69 | Temp 98.1°F | Resp 16 | Ht 66.5 in | Wt 142.8 lb

## 2022-04-20 DIAGNOSIS — N1831 Chronic kidney disease, stage 3a: Secondary | ICD-10-CM | POA: Diagnosis not present

## 2022-04-20 DIAGNOSIS — I471 Supraventricular tachycardia, unspecified: Secondary | ICD-10-CM

## 2022-04-20 DIAGNOSIS — F419 Anxiety disorder, unspecified: Secondary | ICD-10-CM

## 2022-04-20 DIAGNOSIS — D0511 Intraductal carcinoma in situ of right breast: Secondary | ICD-10-CM

## 2022-04-20 DIAGNOSIS — E7800 Pure hypercholesterolemia, unspecified: Secondary | ICD-10-CM | POA: Diagnosis not present

## 2022-04-20 DIAGNOSIS — M8580 Other specified disorders of bone density and structure, unspecified site: Secondary | ICD-10-CM | POA: Diagnosis not present

## 2022-04-20 DIAGNOSIS — Z86010 Personal history of colonic polyps: Secondary | ICD-10-CM | POA: Diagnosis not present

## 2022-04-20 DIAGNOSIS — R197 Diarrhea, unspecified: Secondary | ICD-10-CM | POA: Diagnosis not present

## 2022-04-20 MED ORDER — SERTRALINE HCL 50 MG PO TABS: 50.0000 mg | ORAL_TABLET | Freq: Every day | ORAL | 1 refills | Status: DC

## 2022-04-20 NOTE — Progress Notes (Signed)
Patient ID: Claudia Little, female   DOB: 07-28-1945, 77 y.o.   MRN: [id number]   Subjective:    Patient ID: Claudia Little, female    DOB: 28-Feb-1946, 77 y.o.   MRN: [id number]   Patient here for  Chief Complaint  Patient presents with   Anxiety    Follow up on Zoloft.   Marland Kitchen   HPI Here to follow up regarding her cholesterol.  Has history of SVT.  Has done well on atenolol.  Has had increased stress wit her husband's medical issues.   Discussed.  Started on zoloft last visit. Is doing better on the medication.  Feeling better.  Feels like it has helped to level things out.  Also reported left arm/hand - feels like it is asleep/tingling. - last visit. This has resolved and is not an issue for her now.   No chest pain reported.  No abdominal pain reported.     Past Medical History:  Diagnosis Date   Breast cancer ([place]) 12/2013   right breast cancer   Chronic anxiety    Dysrhythmia    SVT   History of kidney stones    Insomnia    Nephrolithiasis    Osteoporosis    PAC (premature atrial contraction)    Palpitations    Personal history of radiation therapy 2015   Right breast   SVT (supraventricular tachycardia)    Past Surgical History:  Procedure Laterality Date   BREAST CYST ASPIRATION Left 10+ yrs ago   neg   BREAST EXCISIONAL BIOPSY Right 12/15/2013   DCIS   BREAST LUMPECTOMY     BREAST SURGERY Right 12/15/2013   lumpectomy   BUNIONECTOMY  early '70s   COLONOSCOPY WITH PROPOFOL N/A 05/27/2017   Procedure: COLONOSCOPY WITH PROPOFOL;  Surgeon: Lollie Sails, MD;  Location: [facility] ENDOSCOPY;  Service: Endoscopy;  Laterality: N/A;   DILATION AND CURETTAGE OF UTERUS  1973   after miscarriage   LITHOTRIPSY     Family History  Problem Relation Age of Onset   Aortic stenosis Mother    Congestive Heart Failure Mother    Aortic stenosis Father    Coronary artery disease Father        Deceased from complications of atherosclerotic CAD and Atherosclerosis   Congestive Heart  Failure Father    Parkinsonism Brother    Colon polyps Sister    Arthritis Maternal Grandfather    Early death Paternal Grandmother    Heart block Brother    Stroke Maternal Grandmother    Kidney disease Paternal Grandfather    Breast cancer Maternal Aunt 70   Ovarian cancer Maternal Aunt    Social History   Socioeconomic History   Marital status: Married    Spouse name: Richard   Number of children: 1   Years of education: HS   Highest education level: Not on file  Occupational History   Occupation: Retired  Tobacco Use   Smoking status: Never   Smokeless tobacco: Former  Substance and Sexual Activity   Alcohol use: Yes    Alcohol/week: 7.0 standard drinks of alcohol    Types: 7 Glasses of wine per week   Drug use: No   Sexual activity: Not Currently  Other Topics Concern   Not on file  Social History Narrative   Not on file   Social Determinants of Health   Financial Resource Strain: Low Risk  (08/31/2021)   Overall Financial Resource Strain (CARDIA)    Difficulty of Paying Living  Expenses: Not hard at all  Food Insecurity: No Food Insecurity (08/31/2021)   Hunger Vital Sign    Worried About Running Out of Food in the Last Year: Never true    Ran Out of Food in the Last Year: Never true  Transportation Needs: No Transportation Needs (08/31/2021)   PRAPARE - Hydrologist (Medical): No    Lack of Transportation (Non-Medical): No  Physical Activity: Insufficiently Active (08/31/2021)   Exercise Vital Sign    Days of Exercise per Week: 4 days    Minutes of Exercise per Session: 20 min  Stress: No Stress Concern Present (08/31/2021)   [place]    Feeling of Stress : Not at all  Social Connections: Unknown (08/31/2021)   Social Connection and Isolation Panel [NHANES]    Frequency of Communication with Friends and Family: Not on file    Frequency of Social Gatherings with Friends  and Family: Not on file    Attends Religious Services: Not on file    Active Member of Clubs or Organizations: Not on file    Attends Archivist Meetings: Not on file    Marital Status: Married     Review of Systems  Constitutional:  Negative for appetite change and unexpected weight change.  HENT:  Negative for congestion and sinus pressure.   Respiratory:  Negative for cough, chest tightness and shortness of breath.   Cardiovascular:  Negative for chest pain, palpitations and leg swelling.  Gastrointestinal:  Negative for abdominal pain, diarrhea, nausea and vomiting.  Genitourinary:  Negative for difficulty urinating and dysuria.  Musculoskeletal:  Negative for joint swelling and myalgias.  Skin:  Negative for color change and rash.  Neurological:  Negative for dizziness and headaches.  Psychiatric/Behavioral:  Negative for agitation and dysphoric mood.        Objective:     BP 112/60   Pulse 69   Temp 98.1 F (36.7 C)   Resp 16   Ht 5' 6.5" (1.689 m)   Wt 142 lb 12.8 oz (64.8 kg)   SpO2 99%   BMI 22.70 kg/m  Wt Readings from Last 3 Encounters:  04/20/22 142 lb 12.8 oz (64.8 kg)  03/01/22 145 lb 12.8 oz (66.1 kg)  02/21/22 145 lb 3.2 oz (65.9 kg)    Physical Exam Vitals reviewed.  Constitutional:      General: She is not in acute distress.    Appearance: Normal appearance.  HENT:     Head: Normocephalic and atraumatic.     Right Ear: External ear normal.     Left Ear: External ear normal.  Eyes:     General: No scleral icterus.       Right eye: No discharge.        Left eye: No discharge.     Conjunctiva/sclera: Conjunctivae normal.  Neck:     Thyroid: No thyromegaly.  Cardiovascular:     Rate and Rhythm: Normal rate and regular rhythm.  Pulmonary:     Effort: No respiratory distress.     Breath sounds: Normal breath sounds. No wheezing.  Abdominal:     General: Bowel sounds are normal.     Palpations: Abdomen is soft.     Tenderness:  There is no abdominal tenderness.  Musculoskeletal:        General: No swelling or tenderness.     Cervical back: Neck supple. No tenderness.  Lymphadenopathy:  Cervical: No cervical adenopathy.  Skin:    Findings: No erythema or rash.  Neurological:     Mental Status: She is alert.  Psychiatric:        Mood and Affect: Mood normal.        Behavior: Behavior normal.      Outpatient Encounter Medications as of 04/20/2022  Medication Sig   atenolol (TENORMIN) 25 MG tablet Take 1 tablet (25 mg total) by mouth daily.   Cholecalciferol 25 MCG (1000 UT) tablet Take 4,000 Units by mouth daily.   Coenzyme Q10 100 MG TABS Take 1 tablet (100 mg total) by mouth daily.   cyanocobalamin 1000 MCG tablet Take by mouth.   rosuvastatin (CRESTOR) 5 MG tablet Take 1 tablet (5 mg total) by mouth daily.   sertraline (ZOLOFT) 50 MG tablet Take 1 tablet (50 mg total) by mouth daily. 1/2 tablet q day x 1 week and then one po q day.   [DISCONTINUED] sertraline (ZOLOFT) 50 MG tablet 1/2 tablet q day x 1 week and then one po q day. (Patient taking differently: Take 50 mg by mouth daily. 1/2 tablet q day x 1 week and then one po q day.)   No facility-administered encounter medications on file as of 04/20/2022.     Lab Results  Component Value Date   WBC 8.3 02/26/2022   HGB 12.7 02/26/2022   HCT 38.3 02/26/2022   PLT 329.0 02/26/2022   GLUCOSE 95 02/26/2022   CHOL 142 02/26/2022   TRIG 155.0 (H) 02/26/2022   HDL 36.90 (L) 02/26/2022   LDLDIRECT 96.0 09/13/2020   LDLCALC 74 02/26/2022   ALT 14 02/26/2022   AST 13 02/26/2022   NA 137 02/26/2022   K 3.8 02/26/2022   CL 101 02/26/2022   CREATININE 0.95 02/26/2022   BUN 15 02/26/2022   CO2 27 02/26/2022   TSH 1.06 02/26/2022   HGBA1C 5.3 01/09/2019    MM 3D SCREEN BREAST BILATERAL  Result Date: 12/19/2021 CLINICAL DATA:  Screening. EXAM: DIGITAL SCREENING BILATERAL MAMMOGRAM WITH TOMOSYNTHESIS AND CAD TECHNIQUE: Bilateral screening digital  craniocaudal and mediolateral oblique mammograms were obtained. Bilateral screening digital breast tomosynthesis was performed. The images were evaluated with computer-aided detection. COMPARISON:  Previous exam(s). ACR Breast Density Category c: The breast tissue is heterogeneously dense, which may obscure small masses. FINDINGS: There are no findings suspicious for malignancy. IMPRESSION: No mammographic evidence of malignancy. A result letter of this screening mammogram will be mailed directly to the patient. RECOMMENDATION: Screening mammogram in one year. (Code:SM-B-01Y) BI-RADS CATEGORY  1: Negative. Electronically Signed   By: Fidela Salisbury M.D.   On: 12/19/2021 13:23       Assessment & Plan:   Problem List Items Addressed This Visit     Supraventricular tachycardia    Has done well on atenolol.  Continue current dose.  Follow.  She feels is stable.       osteoporosis    reclast 08/30/21 - followed by endocrinology.       Hypercholesteremia - Primary    Tolerating Crestor.  Continue low-cholesterol diet and exercise.  Follow-up lipid panel liver function test.  Lab Results  Component Value Date   CHOL 142 02/26/2022   HDL 36.90 (L) 02/26/2022   LDLCALC 74 02/26/2022   LDLDIRECT 96.0 09/13/2020   TRIG 155.0 (H) 02/26/2022   CHOLHDL 4 02/26/2022       Relevant Orders   Basic metabolic panel   Hepatic function panel   Lipid panel  History of colon polyps    Colonoscopy 05/2017 - clear.  Follow.       Ductal carcinoma in situ (DCIS) of right breast    S/p lumpectomy - right breast.  S/p XRT. ER/PR positive.  Mammogram 12/21/2020 - Birads II.   Recent right breast pain - resolved.  Right breast mammogram and ultrasound 07/11/21 - Birads I.  Screening bilateral mammogram - 12/18/21 - Birads I.       Diarrhea    No increased diarrhea reported.  Continue zoloft.  Benefiber.        CKD (chronic kidney disease) stage 3, GFR 30-59 ml/min (HCC)    Avoid antiinflammatories.   Stay hydrated.  GFR checked 09/2021 - 55.  Follow metabolic panel.       Anxiety    Started on zoloft last visit.  Doing better.  Feels better.  Continue zoloft at current dose.  Follow.       Relevant Medications   sertraline (ZOLOFT) 50 MG tablet    Einar Pheasant, MD

## 2022-04-29 ENCOUNTER — Encounter: Payer: Self-pay | Admitting: Internal Medicine

## 2022-04-29 NOTE — Assessment & Plan Note (Signed)
Has done well on atenolol.  Continue current dose.  Follow.  She feels is stable.

## 2022-04-29 NOTE — Assessment & Plan Note (Signed)
Colonoscopy 05/2017 - clear.  Follow.

## 2022-04-29 NOTE — Assessment & Plan Note (Signed)
Started on zoloft last visit.  Doing better.  Feels better.  Continue zoloft at current dose.  Follow.

## 2022-04-29 NOTE — Assessment & Plan Note (Signed)
Tolerating Crestor.  Continue low-cholesterol diet and exercise.  Follow-up lipid panel liver function test.  Lab Results  Component Value Date   CHOL 142 02/26/2022   HDL 36.90 (L) 02/26/2022   LDLCALC 74 02/26/2022   LDLDIRECT 96.0 09/13/2020   TRIG 155.0 (H) 02/26/2022   CHOLHDL 4 02/26/2022

## 2022-04-29 NOTE — Assessment & Plan Note (Signed)
Avoid antiinflammatories.  Stay hydrated.  GFR checked 09/2021 - 55.  Follow metabolic panel.

## 2022-04-29 NOTE — Assessment & Plan Note (Signed)
No increased diarrhea reported.  Continue zoloft.  Benefiber.

## 2022-04-29 NOTE — Assessment & Plan Note (Signed)
reclast 08/30/21 - followed by endocrinology.

## 2022-04-29 NOTE — Assessment & Plan Note (Signed)
S/p lumpectomy - right breast.  S/p XRT. ER/PR positive.  Mammogram 12/21/2020 - Birads II.   Recent right breast pain - resolved.  Right breast mammogram and ultrasound 07/11/21 - Birads I.  Screening bilateral mammogram - 12/18/21 - Birads I.

## 2022-06-14 DIAGNOSIS — E559 Vitamin D deficiency, unspecified: Secondary | ICD-10-CM | POA: Diagnosis not present

## 2022-06-14 DIAGNOSIS — M810 Age-related osteoporosis without current pathological fracture: Secondary | ICD-10-CM | POA: Diagnosis not present

## 2022-06-26 ENCOUNTER — Other Ambulatory Visit (INDEPENDENT_AMBULATORY_CARE_PROVIDER_SITE_OTHER): Payer: Medicare PPO

## 2022-06-26 DIAGNOSIS — E7800 Pure hypercholesterolemia, unspecified: Secondary | ICD-10-CM

## 2022-06-26 LAB — LIPID PANEL
Cholesterol: 167 mg/dL (ref 0–200)
HDL: 52.6 mg/dL (ref >39.00)
LDL Cholesterol: 79 mg/dL (ref 0–99)
NonHDL: 114.51
Total CHOL/HDL Ratio: 3
Triglycerides: 176 mg/dL — ABNORMAL HIGH (ref 0.0–149.0)
VLDL: 35.2 mg/dL (ref 0.0–40.0)

## 2022-06-26 LAB — BASIC METABOLIC PANEL
BUN: 15 mg/dL (ref 6–23)
CO2: 29 mEq/L (ref 19–32)
Calcium: 9 mg/dL (ref 8.4–10.5)
Chloride: 103 mEq/L (ref 96–112)
Creatinine, Ser: 0.92 mg/dL (ref 0.40–1.20)
GFR: 59.95 mL/min — ABNORMAL LOW (ref >60.00)
Glucose, Bld: 95 mg/dL (ref 70–99)
Potassium: 4.2 mEq/L (ref 3.5–5.1)
Sodium: 139 mEq/L (ref 135–145)

## 2022-06-26 LAB — HEPATIC FUNCTION PANEL
ALT: 16 U/L (ref 0–35)
AST: 17 U/L (ref 0–37)
Albumin: 4.4 g/dL (ref 3.5–5.2)
Alkaline Phosphatase: 58 U/L (ref 39–117)
Bilirubin, Direct: 0.1 mg/dL (ref 0.0–0.3)
Total Bilirubin: 0.6 mg/dL (ref 0.2–1.2)
Total Protein: 7.1 g/dL (ref 6.0–8.3)

## 2022-06-29 ENCOUNTER — Ambulatory Visit: Payer: Medicare PPO | Admitting: Internal Medicine

## 2022-07-02 ENCOUNTER — Ambulatory Visit: Payer: Medicare PPO | Admitting: Internal Medicine

## 2022-07-02 ENCOUNTER — Encounter: Payer: Self-pay | Admitting: Internal Medicine

## 2022-07-02 VITALS — BP 118/70 | HR 74 | Temp 98.2°F | Resp 16 | Ht 66.5 in | Wt 145.6 lb

## 2022-07-02 DIAGNOSIS — Z86010 Personal history of colonic polyps: Secondary | ICD-10-CM | POA: Diagnosis not present

## 2022-07-02 DIAGNOSIS — M8580 Other specified disorders of bone density and structure, unspecified site: Secondary | ICD-10-CM

## 2022-07-02 DIAGNOSIS — I471 Supraventricular tachycardia, unspecified: Secondary | ICD-10-CM | POA: Diagnosis not present

## 2022-07-02 DIAGNOSIS — Z1211 Encounter for screening for malignant neoplasm of colon: Secondary | ICD-10-CM | POA: Diagnosis not present

## 2022-07-02 DIAGNOSIS — N1831 Chronic kidney disease, stage 3a: Secondary | ICD-10-CM | POA: Diagnosis not present

## 2022-07-02 DIAGNOSIS — D0511 Intraductal carcinoma in situ of right breast: Secondary | ICD-10-CM | POA: Diagnosis not present

## 2022-07-02 DIAGNOSIS — E538 Deficiency of other specified B group vitamins: Secondary | ICD-10-CM

## 2022-07-02 DIAGNOSIS — F419 Anxiety disorder, unspecified: Secondary | ICD-10-CM | POA: Diagnosis not present

## 2022-07-02 DIAGNOSIS — E7800 Pure hypercholesterolemia, unspecified: Secondary | ICD-10-CM | POA: Diagnosis not present

## 2022-07-02 MED ORDER — ROSUVASTATIN CALCIUM 5 MG PO TABS: 5.0000 mg | ORAL_TABLET | Freq: Every day | ORAL | 3 refills | Status: DC

## 2022-07-02 MED ORDER — SERTRALINE HCL 50 MG PO TABS: 50.0000 mg | ORAL_TABLET | Freq: Every day | ORAL | 1 refills | Status: DC

## 2022-07-02 MED ORDER — ATENOLOL 25 MG PO TABS: 25.0000 mg | ORAL_TABLET | Freq: Every day | ORAL | 1 refills | Status: DC

## 2022-07-02 NOTE — Progress Notes (Signed)
Subjective:    Patient ID: Claudia Little, female    DOB: 1945-06-26, 77 y.o.   MRN: [id number]  Patient here for  Chief Complaint  Patient presents with   Medical Management of Chronic Issues    HPI Here to follow up regarding her cholesterol.  Has history of SVT.  Has done well on atenolol.  Has had increased stress with her husband's medical issues. Overall she feels she is handling things relatively well.  On zoloft.  Doing well on this medication.  Tries to stay active.  No chest pain or sob reported.  No nausea or vomiting reported.  No abdominal pain reported.  Discussed due colonoscopy.  Agreeable.  Discussed labs.     Past Medical History:  Diagnosis Date   Breast cancer 12/2013   right breast cancer   Chronic anxiety    Dysrhythmia    SVT   History of kidney stones    Insomnia    Nephrolithiasis    Osteoporosis    PAC (premature atrial contraction)    Palpitations    Personal history of radiation therapy 2015   Right breast   SVT (supraventricular tachycardia)    Past Surgical History:  Procedure Laterality Date   BREAST CYST ASPIRATION Left 10+ yrs ago   neg   BREAST EXCISIONAL BIOPSY Right 12/15/2013   DCIS   BREAST LUMPECTOMY     BREAST SURGERY Right 12/15/2013   lumpectomy   BUNIONECTOMY  early '70s   COLONOSCOPY WITH PROPOFOL N/A 05/27/2017   Procedure: COLONOSCOPY WITH PROPOFOL;  Surgeon: Christena Deem, MD;  Location: [facility] ENDOSCOPY;  Service: Endoscopy;  Laterality: N/A;   DILATION AND CURETTAGE OF UTERUS  1973   after miscarriage   LITHOTRIPSY     Family History  Problem Relation Age of Onset   Aortic stenosis Mother    Congestive Heart Failure Mother    Aortic stenosis Father    Coronary artery disease Father        Deceased from complications of atherosclerotic CAD and Atherosclerosis   Congestive Heart Failure Father    Parkinsonism Brother    Colon polyps Sister    Arthritis Maternal Grandfather    Early death Paternal Grandmother     Heart block Brother    Stroke Maternal Grandmother    Kidney disease Paternal Grandfather    Breast cancer Maternal Aunt 55   Ovarian cancer Maternal Aunt    Social History   Socioeconomic History   Marital status: Married    Spouse name: Richard   Number of children: 1   Years of education: HS   Highest education level: Not on file  Occupational History   Occupation: Retired  Tobacco Use   Smoking status: Never   Smokeless tobacco: Former  Substance and Sexual Activity   Alcohol use: Yes    Alcohol/week: 7.0 standard drinks of alcohol    Types: 7 Glasses of wine per week   Drug use: No   Sexual activity: Not Currently  Other Topics Concern   Not on file  Social History Narrative   Not on file   Social Determinants of Health   Financial Resource Strain: Low Risk  (08/31/2021)   Overall Financial Resource Strain (CARDIA)    Difficulty of Paying Living Expenses: Not hard at all  Food Insecurity: No Food Insecurity (08/31/2021)   Hunger Vital Sign    Worried About Running Out of Food in the Last Year: Never true    Ran Out of  Food in the Last Year: Never true  Transportation Needs: No Transportation Needs (08/31/2021)   PRAPARE - Administrator, Civil Service (Medical): No    Lack of Transportation (Non-Medical): No  Physical Activity: Insufficiently Active (08/31/2021)   Exercise Vital Sign    Days of Exercise per Week: 4 days    Minutes of Exercise per Session: 20 min  Stress: No Stress Concern Present (08/31/2021)   [employer] of Occupational Health - Occupational Stress Questionnaire    Feeling of Stress : Not at all  Social Connections: Unknown (08/31/2021)   Social Connection and Isolation Panel [NHANES]    Frequency of Communication with Friends and Family: Not on file    Frequency of Social Gatherings with Friends and Family: Not on file    Attends Religious Services: Not on file    Active Member of Clubs or Organizations: Not on file    Attends  Banker Meetings: Not on file    Marital Status: Married     Review of Systems  Constitutional:  Negative for appetite change and unexpected weight change.  HENT:  Negative for congestion and sinus pressure.   Respiratory:  Negative for cough, chest tightness and shortness of breath.   Cardiovascular:  Negative for chest pain and palpitations.  Gastrointestinal:  Negative for abdominal pain, diarrhea, nausea and vomiting.  Genitourinary:  Negative for difficulty urinating and dysuria.  Musculoskeletal:  Negative for joint swelling and myalgias.  Skin:  Negative for color change and rash.  Neurological:  Negative for dizziness and headaches.  Psychiatric/Behavioral:  Negative for agitation and dysphoric mood.        Objective:     BP 118/70   Pulse 74   Temp 98.2 F (36.8 C)   Resp 16   Ht 5' 6.5" (1.689 m)   Wt 145 lb 9.6 oz (66 kg)   SpO2 98%   BMI 23.15 kg/m  Wt Readings from Last 3 Encounters:  07/02/22 145 lb 9.6 oz (66 kg)  04/20/22 142 lb 12.8 oz (64.8 kg)  03/01/22 145 lb 12.8 oz (66.1 kg)    Physical Exam Vitals reviewed.  Constitutional:      General: She is not in acute distress.    Appearance: Normal appearance.  HENT:     Head: Normocephalic and atraumatic.     Right Ear: External ear normal.     Left Ear: External ear normal.  Eyes:     General: No scleral icterus.       Right eye: No discharge.        Left eye: No discharge.     Conjunctiva/sclera: Conjunctivae normal.  Neck:     Thyroid: No thyromegaly.  Cardiovascular:     Rate and Rhythm: Normal rate and regular rhythm.  Pulmonary:     Effort: No respiratory distress.     Breath sounds: Normal breath sounds. No wheezing.  Abdominal:     General: Bowel sounds are normal.     Palpations: Abdomen is soft.     Tenderness: There is no abdominal tenderness.  Musculoskeletal:        General: No swelling or tenderness.     Cervical back: Neck supple. No tenderness.   Lymphadenopathy:     Cervical: No cervical adenopathy.  Skin:    Findings: No erythema or rash.  Neurological:     Mental Status: She is alert.  Psychiatric:        Mood and Affect: Mood normal.  Behavior: Behavior normal.      Outpatient Encounter Medications as of 07/02/2022  Medication Sig   atenolol (TENORMIN) 25 MG tablet Take 1 tablet (25 mg total) by mouth daily.   Cholecalciferol 25 MCG (1000 UT) tablet Take 4,000 Units by mouth daily.   Coenzyme Q10 100 MG TABS Take 1 tablet (100 mg total) by mouth daily.   cyanocobalamin 1000 MCG tablet Take by mouth.   rosuvastatin (CRESTOR) 5 MG tablet Take 1 tablet (5 mg total) by mouth daily.   sertraline (ZOLOFT) 50 MG tablet Take 1 tablet (50 mg total) by mouth daily.   [DISCONTINUED] atenolol (TENORMIN) 25 MG tablet Take 1 tablet (25 mg total) by mouth daily.   [DISCONTINUED] rosuvastatin (CRESTOR) 5 MG tablet Take 1 tablet (5 mg total) by mouth daily.   [DISCONTINUED] sertraline (ZOLOFT) 50 MG tablet Take 1 tablet (50 mg total) by mouth daily. 1/2 tablet q day x 1 week and then one po q day. (Patient taking differently: Take 50 mg by mouth daily.)   No facility-administered encounter medications on file as of 07/02/2022.     Lab Results  Component Value Date   WBC 8.3 02/26/2022   HGB 12.7 02/26/2022   HCT 38.3 02/26/2022   PLT 329.0 02/26/2022   GLUCOSE 95 06/26/2022   CHOL 167 06/26/2022   TRIG 176.0 (H) 06/26/2022   HDL 52.60 06/26/2022   LDLDIRECT 96.0 09/13/2020   LDLCALC 79 06/26/2022   ALT 16 06/26/2022   AST 17 06/26/2022   NA 139 06/26/2022   K 4.2 06/26/2022   CL 103 06/26/2022   CREATININE 0.92 06/26/2022   BUN 15 06/26/2022   CO2 29 06/26/2022   TSH 1.06 02/26/2022   HGBA1C 5.3 01/09/2019    MM 3D SCREEN BREAST BILATERAL  Result Date: 12/19/2021 CLINICAL DATA:  Screening. EXAM: DIGITAL SCREENING BILATERAL MAMMOGRAM WITH TOMOSYNTHESIS AND CAD TECHNIQUE: Bilateral screening digital craniocaudal and  mediolateral oblique mammograms were obtained. Bilateral screening digital breast tomosynthesis was performed. The images were evaluated with computer-aided detection. COMPARISON:  Previous exam(s). ACR Breast Density Category c: The breast tissue is heterogeneously dense, which may obscure small masses. FINDINGS: There are no findings suspicious for malignancy. IMPRESSION: No mammographic evidence of malignancy. A result letter of this screening mammogram will be mailed directly to the patient. RECOMMENDATION: Screening mammogram in one year. (Code:SM-B-01Y) BI-RADS CATEGORY  1: Negative. Electronically Signed   By: Ted Mcalpine M.D.   On: 12/19/2021 13:23       Assessment & Plan:  Hypercholesteremia Assessment & Plan: Tolerating Crestor.  Continue low-cholesterol diet and exercise.  Follow-up lipid panel liver function test.  Lab Results  Component Value Date   CHOL 167 06/26/2022   HDL 52.60 06/26/2022   LDLCALC 79 06/26/2022   LDLDIRECT 96.0 09/13/2020   TRIG 176.0 (H) 06/26/2022   CHOLHDL 3 06/26/2022    Orders: -     Lipid panel; Future -     Hepatic function panel; Future -     Basic metabolic panel; Future  Supraventricular tachycardia Assessment & Plan: Has done well on atenolol.  Continue current dose.  Follow.  She feels is stable.    Colon cancer screening -     Ambulatory referral to Gastroenterology  Anxiety Assessment & Plan: Doing better.  Feels better.  Continue zoloft at current dose.  Follow.    B12 deficiency Assessment & Plan: On oral B12.  Check B12 level with next labs.   Orders: -  Vitamin B12; Future  Stage 3a chronic kidney disease Assessment & Plan: Avoid antiinflammatories.  Stay hydrated.  Last GFR 59.95 - improved. Follow metabolic panel.    Ductal carcinoma in situ (DCIS) of right breast Assessment & Plan: S/p lumpectomy - right breast.  S/p XRT. ER/PR positive.  Mammogram 12/21/2020 - [facility] II.   Recent right breast pain -  resolved.  Right breast mammogram and ultrasound 07/11/21 - Birads I.  Screening bilateral mammogram - 12/18/21 - [facility]ads I.    History of colon polyps Assessment & Plan: Colonoscopy 05/2017 - clear.  Due f/u.  Refer back to GI.    Osteopenia, unspecified location Assessment & Plan: reclast 08/30/21 - followed by endocrinology. Just evaluated.  Planning for reclast infusion in June.    SVT (supraventricular tachycardia) Assessment & Plan: Has done well on atenolol.  Continue current dose.  Follow.  She feels is stable.    Paroxysmal SVT (supraventricular tachycardia) Assessment & Plan: Has done well on atenolol.  Continue current dose.  Follow.  She feels is stable.    Other orders -     Atenolol; Take 1 tablet (25 mg total) by mouth daily.  Dispense: 90 tablet; Refill: 1 -     Rosuvastatin Calcium; Take 1 tablet (5 mg total) by mouth daily.  Dispense: 90 tablet; Refill: 3 -     Sertraline HCl; Take 1 tablet (50 mg total) by mouth daily.  Dispense: 90 tablet; Refill: 1     Dale Bolton, MD

## 2022-07-08 ENCOUNTER — Encounter: Payer: Self-pay | Admitting: Internal Medicine

## 2022-07-08 NOTE — Assessment & Plan Note (Signed)
Doing better.  Feels better.  Continue zoloft at current dose.  Follow.

## 2022-07-08 NOTE — Assessment & Plan Note (Signed)
S/p lumpectomy - right breast.  S/p XRT. ER/PR positive.  Mammogram 12/21/2020 - [facility] II.   Recent right breast pain - resolved.  Right breast mammogram and ultrasound 07/11/21 - [facility] I.  Screening bilateral mammogram - 12/18/21 - [facility] I.  

## 2022-07-08 NOTE — Assessment & Plan Note (Signed)
On oral B12.  Check B12 level with next labs.   

## 2022-07-08 NOTE — Assessment & Plan Note (Signed)
Avoid antiinflammatories.  Stay hydrated.  Last GFR 59.95 - improved. Follow metabolic panel.

## 2022-07-08 NOTE — Assessment & Plan Note (Signed)
Colonoscopy 05/2017 - clear.  Due f/u.  Refer back to GI.

## 2022-07-08 NOTE — Assessment & Plan Note (Signed)
Tolerating Crestor.  Continue low-cholesterol diet and exercise.  Follow-up lipid panel liver function test.  Lab Results  Component Value Date   CHOL 167 06/26/2022   HDL 52.60 06/26/2022   LDLCALC 79 06/26/2022   LDLDIRECT 96.0 09/13/2020   TRIG 176.0 (H) 06/26/2022   CHOLHDL 3 06/26/2022

## 2022-07-08 NOTE — Assessment & Plan Note (Addendum)
reclast 08/30/21 - followed by endocrinology. Just evaluated.  Planning for reclast infusion in June.

## 2022-07-08 NOTE — Assessment & Plan Note (Signed)
Has done well on atenolol.  Continue current dose.  Follow.  She feels is stable.  

## 2022-08-22 ENCOUNTER — Telehealth: Payer: Self-pay | Admitting: Internal Medicine

## 2022-08-22 NOTE — Telephone Encounter (Signed)
Copied from CRM [id number]. Topic: Medicare AWV >> Aug 22, 2022 10:35 AM Payton Doughty wrote: Reason for CRM: Called patient to schedule Medicare Annual Wellness Visit (AWV). Left message for patient to call back and schedule Medicare Annual Wellness Visit (AWV).  Please schedule an appointment at any time with Sydell Axon, CMA  .  If any questions, please contact me.  Thank you ,  Verlee Rossetti; Care Guide Ambulatory Clinical Support [facility] Direct Dial: [phone]

## 2022-09-07 DIAGNOSIS — M810 Age-related osteoporosis without current pathological fracture: Secondary | ICD-10-CM | POA: Diagnosis not present

## 2022-09-07 DIAGNOSIS — E559 Vitamin D deficiency, unspecified: Secondary | ICD-10-CM | POA: Diagnosis not present

## 2022-09-19 DIAGNOSIS — M810 Age-related osteoporosis without current pathological fracture: Secondary | ICD-10-CM | POA: Diagnosis not present

## 2022-10-24 DIAGNOSIS — Z85828 Personal history of other malignant neoplasm of skin: Secondary | ICD-10-CM | POA: Diagnosis not present

## 2022-10-24 DIAGNOSIS — Z08 Encounter for follow-up examination after completed treatment for malignant neoplasm: Secondary | ICD-10-CM | POA: Diagnosis not present

## 2022-10-24 DIAGNOSIS — L821 Other seborrheic keratosis: Secondary | ICD-10-CM | POA: Diagnosis not present

## 2022-10-24 DIAGNOSIS — D225 Melanocytic nevi of trunk: Secondary | ICD-10-CM | POA: Diagnosis not present

## 2022-10-24 DIAGNOSIS — L728 Other follicular cysts of the skin and subcutaneous tissue: Secondary | ICD-10-CM | POA: Diagnosis not present

## 2022-10-24 DIAGNOSIS — L738 Other specified follicular disorders: Secondary | ICD-10-CM | POA: Diagnosis not present

## 2022-10-26 ENCOUNTER — Other Ambulatory Visit (INDEPENDENT_AMBULATORY_CARE_PROVIDER_SITE_OTHER): Payer: Medicare PPO

## 2022-10-26 DIAGNOSIS — E538 Deficiency of other specified B group vitamins: Secondary | ICD-10-CM

## 2022-10-26 DIAGNOSIS — E7800 Pure hypercholesterolemia, unspecified: Secondary | ICD-10-CM | POA: Diagnosis not present

## 2022-10-26 LAB — HEPATIC FUNCTION PANEL
ALT: 15 U/L (ref 0–35)
AST: 14 U/L (ref 0–37)
Albumin: 4.4 g/dL (ref 3.5–5.2)
Alkaline Phosphatase: 55 U/L (ref 39–117)
Bilirubin, Direct: 0.1 mg/dL (ref 0.0–0.3)
Total Bilirubin: 0.7 mg/dL (ref 0.2–1.2)
Total Protein: 6.9 g/dL (ref 6.0–8.3)

## 2022-10-26 LAB — VITAMIN B12: Vitamin B-12: 918 pg/mL — ABNORMAL HIGH (ref 211–911)

## 2022-10-26 LAB — LIPID PANEL
Cholesterol: 160 mg/dL (ref 0–200)
HDL: 52.5 mg/dL (ref >39.00)
LDL Cholesterol: 73 mg/dL (ref 0–99)
NonHDL: 107.83
Total CHOL/HDL Ratio: 3
Triglycerides: 175 mg/dL — ABNORMAL HIGH (ref 0.0–149.0)
VLDL: 35 mg/dL (ref 0.0–40.0)

## 2022-10-26 LAB — BASIC METABOLIC PANEL
BUN: 20 mg/dL (ref 6–23)
CO2: 29 mEq/L (ref 19–32)
Calcium: 9.4 mg/dL (ref 8.4–10.5)
Chloride: 104 mEq/L (ref 96–112)
Creatinine, Ser: 0.94 mg/dL (ref 0.40–1.20)
GFR: 58.28 mL/min — ABNORMAL LOW (ref >60.00)
Glucose, Bld: 87 mg/dL (ref 70–99)
Potassium: 4.3 mEq/L (ref 3.5–5.1)
Sodium: 141 mEq/L (ref 135–145)

## 2022-11-01 ENCOUNTER — Ambulatory Visit: Payer: Medicare PPO | Admitting: Internal Medicine

## 2022-11-01 ENCOUNTER — Encounter: Payer: Self-pay | Admitting: Internal Medicine

## 2022-11-01 VITALS — BP 114/72 | HR 61 | Temp 97.9°F | Resp 16 | Ht 66.5 in | Wt 149.0 lb

## 2022-11-01 DIAGNOSIS — E7800 Pure hypercholesterolemia, unspecified: Secondary | ICD-10-CM

## 2022-11-01 DIAGNOSIS — Z86010 Personal history of colonic polyps: Secondary | ICD-10-CM

## 2022-11-01 DIAGNOSIS — E538 Deficiency of other specified B group vitamins: Secondary | ICD-10-CM

## 2022-11-01 DIAGNOSIS — N1831 Chronic kidney disease, stage 3a: Secondary | ICD-10-CM

## 2022-11-01 DIAGNOSIS — I471 Supraventricular tachycardia, unspecified: Secondary | ICD-10-CM

## 2022-11-01 DIAGNOSIS — Z1231 Encounter for screening mammogram for malignant neoplasm of breast: Secondary | ICD-10-CM

## 2022-11-01 DIAGNOSIS — D0511 Intraductal carcinoma in situ of right breast: Secondary | ICD-10-CM | POA: Diagnosis not present

## 2022-11-01 DIAGNOSIS — M8580 Other specified disorders of bone density and structure, unspecified site: Secondary | ICD-10-CM | POA: Diagnosis not present

## 2022-11-01 DIAGNOSIS — F419 Anxiety disorder, unspecified: Secondary | ICD-10-CM | POA: Diagnosis not present

## 2022-11-01 DIAGNOSIS — Z0000 Encounter for general adult medical examination without abnormal findings: Secondary | ICD-10-CM

## 2022-11-01 MED ORDER — SERTRALINE HCL 50 MG PO TABS: 50.0000 mg | ORAL_TABLET | Freq: Every day | ORAL | 1 refills | Status: DC

## 2022-11-01 MED ORDER — ATENOLOL 25 MG PO TABS: 25.0000 mg | ORAL_TABLET | Freq: Every day | ORAL | 1 refills | Status: DC

## 2022-11-01 NOTE — Progress Notes (Signed)
Subjective:    Patient ID: Claudia Little, female    DOB: February 07, 1946, 77 y.o.   MRN: [id number]  Patient here for  Chief Complaint  Patient presents with   Annual Exam    HPI Here for a physical exam.  Has history of SVT.  Has done well on atenolol.  Has had increased stress with her husband's medical issues. Overall she feels she is handling things relatively well.  On zoloft.  Doing well on this medication. Received reclast infusion 09/19/22.  Planning for f/u appt and bone density in 09/2023. Stays active.  No chest pain or sob reported.  No cough or congestion.  No abdominal pain or bowel change reported. Stays active.     Past Medical History:  Diagnosis Date   Breast cancer (HCC) 12/2013   right breast cancer   Chronic anxiety    Dysrhythmia    SVT   History of kidney stones    Insomnia    Nephrolithiasis    Osteoporosis    PAC (premature atrial contraction)    Palpitations    Personal history of radiation therapy 2015   Right breast   SVT (supraventricular tachycardia)    Past Surgical History:  Procedure Laterality Date   BREAST CYST ASPIRATION Left 10+ yrs ago   neg   BREAST EXCISIONAL BIOPSY Right 12/15/2013   DCIS   BREAST LUMPECTOMY     BREAST SURGERY Right 12/15/2013   lumpectomy   BUNIONECTOMY  early '70s   COLONOSCOPY WITH PROPOFOL N/A 05/27/2017   Procedure: COLONOSCOPY WITH PROPOFOL;  Surgeon: Christena Deem, MD;  Location: [facility] ENDOSCOPY;  Service: Endoscopy;  Laterality: N/A;   DILATION AND CURETTAGE OF UTERUS  1973   after miscarriage   LITHOTRIPSY     Family History  Problem Relation Age of Onset   Aortic stenosis Mother    Congestive Heart Failure Mother    Aortic stenosis Father    Coronary artery disease Father        Deceased from complications of atherosclerotic CAD and Atherosclerosis   Congestive Heart Failure Father    Parkinsonism Brother    Colon polyps Sister    Arthritis Maternal Grandfather    Early death Paternal  Grandmother    Heart block Brother    Stroke Maternal Grandmother    Kidney disease Paternal Grandfather    Breast cancer Maternal Aunt 91   Ovarian cancer Maternal Aunt    Social History   Socioeconomic History   Marital status: Married    Spouse name: Richard   Number of children: 1   Years of education: HS   Highest education level: Not on file  Occupational History   Occupation: Retired  Tobacco Use   Smoking status: Never   Smokeless tobacco: Former  Substance and Sexual Activity   Alcohol use: Yes    Alcohol/week: 7.0 standard drinks of alcohol    Types: 7 Glasses of wine per week   Drug use: No   Sexual activity: Not Currently  Other Topics Concern   Not on file  Social History Narrative   Not on file   Social Determinants of Health   Financial Resource Strain: Low Risk  (08/31/2021)   Overall Financial Resource Strain (CARDIA)    Difficulty of Paying Living Expenses: Not hard at all  Food Insecurity: No Food Insecurity (08/31/2021)   Hunger Vital Sign    Worried About Running Out of Food in the Last Year: Never true    Ran  Out of Food in the Last Year: Never true  Transportation Needs: No Transportation Needs (08/31/2021)   PRAPARE - Administrator, Civil Service (Medical): No    Lack of Transportation (Non-Medical): No  Physical Activity: Insufficiently Active (08/31/2021)   Exercise Vital Sign    Days of Exercise per Week: 4 days    Minutes of Exercise per Session: 20 min  Stress: No Stress Concern Present (08/31/2021)   [employer] of Occupational Health - Occupational Stress Questionnaire    Feeling of Stress : Not at all  Social Connections: Unknown (08/31/2021)   Social Connection and Isolation Panel [NHANES]    Frequency of Communication with Friends and Family: Not on file    Frequency of Social Gatherings with Friends and Family: Not on file    Attends Religious Services: Not on file    Active Member of Clubs or Organizations: Not on file     Attends Banker Meetings: Not on file    Marital Status: Married     Review of Systems  Constitutional:  Negative for fatigue and unexpected weight change.  HENT:  Negative for congestion, sinus pressure and sore throat.   Eyes:  Negative for pain and visual disturbance.  Respiratory:  Negative for cough, chest tightness and shortness of breath.   Cardiovascular:  Negative for chest pain, palpitations and leg swelling.  Gastrointestinal:  Negative for abdominal pain, diarrhea, nausea and vomiting.  Genitourinary:  Negative for difficulty urinating and dysuria.  Musculoskeletal:  Negative for joint swelling and myalgias.  Skin:  Negative for color change and rash.  Neurological:  Negative for dizziness and headaches.  Hematological:  Negative for adenopathy. Does not bruise/bleed easily.  Psychiatric/Behavioral:  Negative for agitation and dysphoric mood.        Objective:     BP 114/72   Pulse 61   Temp 97.9 F (36.6 C)   Resp 16   Ht 5' 6.5" (1.689 m)   Wt 149 lb (67.6 kg)   SpO2 98%   BMI 23.69 kg/m  Wt Readings from Last 3 Encounters:  11/01/22 149 lb (67.6 kg)  07/02/22 145 lb 9.6 oz (66 kg)  04/20/22 142 lb 12.8 oz (64.8 kg)    Physical Exam Vitals reviewed.  Constitutional:      General: She is not in acute distress.    Appearance: Normal appearance. She is well-developed.  HENT:     Head: Normocephalic and atraumatic.     Right Ear: External ear normal.     Left Ear: External ear normal.  Eyes:     General: No scleral icterus.       Right eye: No discharge.        Left eye: No discharge.     Conjunctiva/sclera: Conjunctivae normal.  Neck:     Thyroid: No thyromegaly.  Cardiovascular:     Rate and Rhythm: Normal rate and regular rhythm.  Pulmonary:     Effort: No tachypnea, accessory muscle usage or respiratory distress.     Breath sounds: Normal breath sounds. No decreased breath sounds or wheezing.  Chest:  Breasts:    Right: No  inverted nipple, mass, nipple discharge or tenderness (no axillary adenopathy).     Left: No inverted nipple, mass, nipple discharge or tenderness (no axilarry adenopathy).  Abdominal:     General: Bowel sounds are normal.     Palpations: Abdomen is soft.     Tenderness: There is no abdominal tenderness.  Musculoskeletal:  General: No swelling or tenderness.     Cervical back: Neck supple.  Lymphadenopathy:     Cervical: No cervical adenopathy.  Skin:    Findings: No erythema or rash.  Neurological:     Mental Status: She is alert and oriented to person, place, and time.  Psychiatric:        Mood and Affect: Mood normal.        Behavior: Behavior normal.      Outpatient Encounter Medications as of 11/01/2022  Medication Sig   atenolol (TENORMIN) 25 MG tablet Take 1 tablet (25 mg total) by mouth daily.   Cholecalciferol 25 MCG (1000 UT) tablet Take 4,000 Units by mouth daily.   Coenzyme Q10 100 MG TABS Take 1 tablet (100 mg total) by mouth daily.   cyanocobalamin 1000 MCG tablet Take by mouth.   rosuvastatin (CRESTOR) 5 MG tablet Take 1 tablet (5 mg total) by mouth daily.   sertraline (ZOLOFT) 50 MG tablet Take 1 tablet (50 mg total) by mouth daily.   [DISCONTINUED] atenolol (TENORMIN) 25 MG tablet Take 1 tablet (25 mg total) by mouth daily.   [DISCONTINUED] sertraline (ZOLOFT) 50 MG tablet Take 1 tablet (50 mg total) by mouth daily.   No facility-administered encounter medications on file as of 11/01/2022.     Lab Results  Component Value Date   WBC 8.3 02/26/2022   HGB 12.7 02/26/2022   HCT 38.3 02/26/2022   PLT 329.0 02/26/2022   GLUCOSE 87 10/26/2022   CHOL 160 10/26/2022   TRIG 175.0 (H) 10/26/2022   HDL 52.50 10/26/2022   LDLDIRECT 96.0 09/13/2020   LDLCALC 73 10/26/2022   ALT 15 10/26/2022   AST 14 10/26/2022   NA 141 10/26/2022   K 4.3 10/26/2022   CL 104 10/26/2022   CREATININE 0.94 10/26/2022   BUN 20 10/26/2022   CO2 29 10/26/2022   TSH 1.06  02/26/2022   HGBA1C 5.3 01/09/2019    MM 3D SCREEN BREAST BILATERAL  Result Date: 12/19/2021 CLINICAL DATA:  Screening. EXAM: DIGITAL SCREENING BILATERAL MAMMOGRAM WITH TOMOSYNTHESIS AND CAD TECHNIQUE: Bilateral screening digital craniocaudal and mediolateral oblique mammograms were obtained. Bilateral screening digital breast tomosynthesis was performed. The images were evaluated with computer-aided detection. COMPARISON:  Previous exam(s). ACR Breast Density Category c: The breast tissue is heterogeneously dense, which may obscure small masses. FINDINGS: There are no findings suspicious for malignancy. IMPRESSION: No mammographic evidence of malignancy. A result letter of this screening mammogram will be mailed directly to the patient. RECOMMENDATION: Screening mammogram in one year. (Code:SM-B-01Y) BI-RADS CATEGORY  1: Negative. Electronically Signed   By: Ted Mcalpine M.D.   On: 12/19/2021 13:23       Assessment & Plan:  Routine general medical examination at a health care facility  Hypercholesteremia Assessment & Plan: Tolerating Crestor.  Continue low-cholesterol diet and exercise.  Follow-up lipid panel liver function test.  Lab Results  Component Value Date   CHOL 160 10/26/2022   HDL 52.50 10/26/2022   LDLCALC 73 10/26/2022   LDLDIRECT 96.0 09/13/2020   TRIG 175.0 (H) 10/26/2022   CHOLHDL 3 10/26/2022    Orders: -     Lipid panel; Future -     Hepatic function panel; Future -     Basic metabolic panel; Future -     CBC with Differential/Platelet; Future -     TSH; Future  Visit for screening mammogram -     3D Screening Mammogram, Left and Right; Future  Healthcare maintenance  Assessment & Plan: Physical today 11/01/22.  Mammogram - 12/18/21 - [facility] I.  Right breast diagnostic mammogram in 07/2021.  Ok.  Recommended continue bilateral screening.  She will call to schedule. Colonoscopy 05/2017 - normal.    Anxiety Assessment & Plan: Doing better.  Feels better.   Continue zoloft at current dose.  Follow.    B12 deficiency Assessment & Plan: B12 level >900.  Decrease oral B12.  Follow.    Stage 3a chronic kidney disease (HCC) Assessment & Plan: Avoid antiinflammatories.  Stay hydrated.  GFR stable (58-59). Follow metabolic panel.    Ductal carcinoma in situ (DCIS) of right breast Assessment & Plan: S/p lumpectomy - right breast.  S/p XRT. ER/PR positive.  Mammogram 12/21/2020 - [facility] II.   Recent right breast pain - resolved.  Right breast mammogram and ultrasound 07/11/21 - [facility] I.  Screening bilateral mammogram - 12/18/21 - [facility] I.  Scheduled f/u mammogram.    History of colon polyps Assessment & Plan: Colonoscopy 05/2017 - clear.  Due f/u.  Received notification.    Osteopenia, unspecified location Assessment & Plan: reclast 09/19/22 - followed by endocrinology. Just evaluated.     Paroxysmal SVT (supraventricular tachycardia) Assessment & Plan: Has done well on atenolol.  Continue current dose.  Follow.  She feels is stable.    Other orders -     Atenolol; Take 1 tablet (25 mg total) by mouth daily.  Dispense: 90 tablet; Refill: 1 -     Sertraline HCl; Take 1 tablet (50 mg total) by mouth daily.  Dispense: 90 tablet; Refill: 1     Dale [employer], MD

## 2022-11-01 NOTE — Assessment & Plan Note (Signed)
reclast 09/19/22 - followed by endocrinology. Just evaluated.

## 2022-11-01 NOTE — Assessment & Plan Note (Signed)
S/p lumpectomy - right breast.  S/p XRT. ER/PR positive.  Mammogram 12/21/2020 - [facility] II.   Recent right breast pain - resolved.  Right breast mammogram and ultrasound 07/11/21 - [facility] I.  Screening bilateral mammogram - 12/18/21 - [facility] I.  Scheduled f/u mammogram.

## 2022-11-01 NOTE — Assessment & Plan Note (Signed)
Tolerating Crestor.  Continue low-cholesterol diet and exercise.  Follow-up lipid panel liver function test.  Lab Results  Component Value Date   CHOL 160 10/26/2022   HDL 52.50 10/26/2022   LDLCALC 73 10/26/2022   LDLDIRECT 96.0 09/13/2020   TRIG 175.0 (H) 10/26/2022   CHOLHDL 3 10/26/2022

## 2022-11-01 NOTE — Assessment & Plan Note (Signed)
Physical today 11/01/22.  Mammogram - 12/18/21 - Bir[facility] I.  Right breast diagnostic mammogram in 07/2021.  Ok.  Recommended continue bilateral screening.  She will call to schedule. Colonoscopy 05/2017 - normal.

## 2022-11-01 NOTE — Assessment & Plan Note (Signed)
Doing better.  Feels better.  Continue zoloft at current dose.  Follow.

## 2022-11-01 NOTE — Assessment & Plan Note (Signed)
Colonoscopy 05/2017 - clear.  Due f/u.  Received notification.

## 2022-11-01 NOTE — Assessment & Plan Note (Signed)
Avoid antiinflammatories.  Stay hydrated.  GFR stable (58-59). Follow metabolic panel.

## 2022-11-01 NOTE — Assessment & Plan Note (Signed)
Has done well on atenolol.  Continue current dose.  Follow.  She feels is stable.  

## 2022-11-01 NOTE — Assessment & Plan Note (Signed)
B12 level >900.  Decrease oral B12.  Follow.

## 2022-11-05 DIAGNOSIS — H43813 Vitreous degeneration, bilateral: Secondary | ICD-10-CM | POA: Diagnosis not present

## 2022-11-05 DIAGNOSIS — H2513 Age-related nuclear cataract, bilateral: Secondary | ICD-10-CM | POA: Diagnosis not present

## 2022-11-05 DIAGNOSIS — Z0100 Encounter for examination of eyes and vision without abnormal findings: Secondary | ICD-10-CM | POA: Diagnosis not present

## 2022-11-12 DIAGNOSIS — H2512 Age-related nuclear cataract, left eye: Secondary | ICD-10-CM | POA: Diagnosis not present

## 2022-11-13 ENCOUNTER — Encounter: Payer: Self-pay | Admitting: Ophthalmology

## 2022-11-14 ENCOUNTER — Encounter: Payer: Self-pay | Admitting: Ophthalmology

## 2022-11-14 NOTE — Anesthesia Preprocedure Evaluation (Addendum)
Anesthesia Evaluation  Patient identified by MRN, date of birth, ID band Patient awake    Reviewed: Allergy & Precautions, H&P , NPO status , Patient's Chart, lab work & pertinent test results  Airway Mallampati: II  TM Distance: >3 FB Neck ROM: Full    Dental no notable dental hx.    Pulmonary neg pulmonary ROS   Pulmonary exam normal breath sounds clear to auscultation       Cardiovascular Normal cardiovascular exam+ dysrhythmias  Rhythm:Regular Rate:Normal  Hx SVT and PAC, but if she has ever had an echocardiogram, I can't find it, nor any reference to anything except "refills" for beta blocker and notes that beta blocker seems to be controlling the SVT.  Onset of SVT unknown to me.    Neuro/Psych   Anxiety     negative neurological ROS  negative psych ROS   GI/Hepatic negative GI ROS, Neg liver ROS,,,  Endo/Other  negative endocrine ROS    Renal/GU Renal disease  negative genitourinary   Musculoskeletal negative musculoskeletal ROS (+)    Abdominal   Peds negative pediatric ROS (+)  Hematology negative hematology ROS (+)   Anesthesia Other Findings Insomnia  Nephrolithiasis Chronic anxiety  Palpitations SVT (supraventricular tachycardia)  PAC (premature atrial contraction) Osteoporosis  History of kidney stones Dysrhythmia  Breast cancer (HCC) Personal history of radiation therapy Vertigo Stage 3a CKD   Reproductive/Obstetrics negative OB ROS                              Anesthesia Physical Anesthesia Plan  ASA: 3  Anesthesia Plan: MAC   Post-op Pain Management:    Induction: Intravenous  PONV Risk Score and Plan:   Airway Management Planned: Natural Airway and Nasal Cannula  Additional Equipment:   Intra-op Plan:   Post-operative Plan:   Informed Consent: I have reviewed the patients History and Physical, chart, labs and discussed the procedure including the  risks, benefits and alternatives for the proposed anesthesia with the patient or authorized representative who has indicated his/her understanding and acceptance.     Dental Advisory Given  Plan Discussed with: Anesthesiologist, CRNA and Surgeon  Anesthesia Plan Comments: (Patient consented for risks of anesthesia including but not limited to:  - adverse reactions to medications - damage to eyes, teeth, lips or other oral mucosa - nerve damage due to positioning  - sore throat or hoarseness - Damage to heart, brain, nerves, lungs, other parts of body or loss of life  Patient voiced understanding.)         Anesthesia Quick Evaluation

## 2022-11-15 NOTE — Discharge Instructions (Signed)

## 2022-11-19 ENCOUNTER — Ambulatory Visit
Admission: RE | Admit: 2022-11-19 | Discharge: 2022-11-19 | Disposition: A | Discharge status: Home / Self Care | Payer: Medicare PPO | Source: Home / Self Care | Attending: Ophthalmology | Admitting: Ophthalmology

## 2022-11-19 ENCOUNTER — Ambulatory Visit: Payer: Medicare PPO | Admitting: Anesthesiology

## 2022-11-19 ENCOUNTER — Other Ambulatory Visit: Payer: Self-pay

## 2022-11-19 ENCOUNTER — Encounter: Payer: Self-pay | Admitting: Ophthalmology

## 2022-11-19 ENCOUNTER — Encounter
Admission: RE | Disposition: A | Discharge status: Home / Self Care | Payer: Self-pay | Source: Home / Self Care | Attending: Ophthalmology

## 2022-11-19 DIAGNOSIS — I471 Supraventricular tachycardia, unspecified: Secondary | ICD-10-CM | POA: Diagnosis not present

## 2022-11-19 DIAGNOSIS — H2511 Age-related nuclear cataract, right eye: Secondary | ICD-10-CM | POA: Insufficient documentation

## 2022-11-19 DIAGNOSIS — N1831 Chronic kidney disease, stage 3a: Secondary | ICD-10-CM | POA: Insufficient documentation

## 2022-11-19 DIAGNOSIS — H2512 Age-related nuclear cataract, left eye: Secondary | ICD-10-CM | POA: Diagnosis not present

## 2022-11-19 DIAGNOSIS — Z8249 Family history of ischemic heart disease and other diseases of the circulatory system: Secondary | ICD-10-CM | POA: Insufficient documentation

## 2022-11-19 DIAGNOSIS — Z79899 Other long term (current) drug therapy: Secondary | ICD-10-CM | POA: Diagnosis not present

## 2022-11-19 DIAGNOSIS — F419 Anxiety disorder, unspecified: Secondary | ICD-10-CM | POA: Insufficient documentation

## 2022-11-19 HISTORY — DX: Chronic kidney disease, stage 3a: N18.31

## 2022-11-19 HISTORY — PX: CATARACT EXTRACTION W/PHACO: SHX586

## 2022-11-19 HISTORY — DX: Dizziness and giddiness: R42

## 2022-11-19 SURGERY — PHACOEMULSIFICATION, CATARACT, WITH IOL INSERTION
Anesthesia: Monitor Anesthesia Care | Site: Eye | Laterality: Left

## 2022-11-19 MED ORDER — TETRACAINE HCL 0.5 % OP SOLN
1.0000 [drp] | OPHTHALMIC | Status: DC | PRN
  Administered 2022-11-19 (×3): 1 [drp] via OPHTHALMIC

## 2022-11-19 MED ORDER — SODIUM CHLORIDE 0.9% FLUSH
INTRAVENOUS | Status: DC | PRN
  Administered 2022-11-19: 10 mL via INTRAVENOUS

## 2022-11-19 MED ORDER — SIGHTPATH DOSE#1 BSS IO SOLN
INTRAOCULAR | Status: DC | PRN
  Administered 2022-11-19: 63 mL via OPHTHALMIC

## 2022-11-19 MED ORDER — SIGHTPATH DOSE#1 BSS IO SOLN
INTRAOCULAR | Status: DC | PRN
  Administered 2022-11-19: 2 mL

## 2022-11-19 MED ORDER — ARMC OPHTHALMIC DILATING DROPS
1.0000 | OPHTHALMIC | Status: DC | PRN
  Administered 2022-11-19 (×3): 1 via OPHTHALMIC

## 2022-11-19 MED ORDER — SIGHTPATH DOSE#1 NA CHONDROIT SULF-NA HYALURON 40-17 MG/ML IO SOLN
INTRAOCULAR | Status: DC | PRN
  Administered 2022-11-19: 1 mL via INTRAOCULAR

## 2022-11-19 MED ORDER — SIGHTPATH DOSE#1 BSS IO SOLN
INTRAOCULAR | Status: DC | PRN
  Administered 2022-11-19: 15 mL via INTRAOCULAR

## 2022-11-19 MED ORDER — LACTATED RINGERS IV SOLN: INTRAVENOUS | Status: DC

## 2022-11-19 MED ORDER — FENTANYL CITRATE (PF) 100 MCG/2ML IJ SOLN
INTRAMUSCULAR | Status: DC | PRN
  Administered 2022-11-19: 50 ug via INTRAVENOUS

## 2022-11-19 MED ORDER — MIDAZOLAM HCL 2 MG/2ML IJ SOLN
INTRAMUSCULAR | Status: DC | PRN
  Administered 2022-11-19: 1 mg via INTRAVENOUS

## 2022-11-19 MED ORDER — MOXIFLOXACIN HCL 0.5 % OP SOLN
OPHTHALMIC | Status: DC | PRN
  Administered 2022-11-19: .2 mL via OPHTHALMIC

## 2022-11-19 MED ORDER — BRIMONIDINE TARTRATE-TIMOLOL 0.2-0.5 % OP SOLN
OPHTHALMIC | Status: DC | PRN
  Administered 2022-11-19: 1 [drp] via OPHTHALMIC

## 2022-11-19 SURGICAL SUPPLY — 12 items
ANGLE REVERSE CUT SHRT 25GA (CUTTER) ×1
CANNULA ANT/CHMB 27G (MISCELLANEOUS) IMPLANT
CANNULA ANT/CHMB 27GA (MISCELLANEOUS)
CATARACT SUITE SIGHTPATH (MISCELLANEOUS) ×1
CYSTOTOME ANGL RVRS SHRT 25G (CUTTER) ×1 IMPLANT
FEE CATARACT SUITE SIGHTPATH (MISCELLANEOUS) ×1 IMPLANT
GLOVE BIOGEL PI IND STRL 8 (GLOVE) ×1 IMPLANT
GLOVE SURG LX STRL 8.0 MICRO (GLOVE) ×1 IMPLANT
LENS IOL TECNIS EYHANCE 21.0 (Intraocular Lens) IMPLANT
NDL FILTER BLUNT 18X1 1/2 (NEEDLE) ×1 IMPLANT
NEEDLE FILTER BLUNT 18X1 1/2 (NEEDLE) ×1
SYR 3ML LL SCALE MARK (SYRINGE) ×1 IMPLANT

## 2022-11-19 NOTE — Anesthesia Postprocedure Evaluation (Signed)
Anesthesia Post Note  Patient: Claudia Little  Procedure(s) Performed: CATARACT EXTRACTION PHACO AND INTRAOCULAR LENS PLACEMENT (IOC) LEFT 15.16 01:16.2 (Left: Eye)  Patient location during evaluation: PACU Anesthesia Type: MAC Level of consciousness: awake and alert Pain management: pain level controlled Vital Signs Assessment: post-procedure vital signs reviewed and stable Respiratory status: spontaneous breathing, nonlabored ventilation, respiratory function stable and patient connected to nasal cannula oxygen Cardiovascular status: stable and blood pressure returned to baseline Postop Assessment: no apparent nausea or vomiting Anesthetic complications: no   No notable events documented.   Last Vitals:  Vitals:   11/19/22 0857 11/19/22 0902  BP: (!) 108/52 (!) 94/57  Pulse: (!) 56 (!) 56  Resp: 14 13  Temp: 36.6 C   SpO2: 96% 95%    Last Pain:  Vitals:   11/19/22 0902  TempSrc:   PainSc: 0-No pain                 [name] C Parv Manthey

## 2022-11-19 NOTE — Op Note (Signed)
PREOPERATIVE DIAGNOSIS:  Nuclear sclerotic cataract of the right eye.   POSTOPERATIVE DIAGNOSIS:  Cataract   OPERATIVE PROCEDURE:ORPROCALL@   SURGEON:  Galen Manila, MD.   ANESTHESIA:  Anesthesiologist: Marisue Humble, MD CRNA: Lynden Oxford, CRNA  1.      Managed anesthesia care. 2.      0.81ml of Shugarcaine was instilled in the eye following the paracentesis.   COMPLICATIONS:  None.   TECHNIQUE:   Stop and chop   DESCRIPTION OF PROCEDURE:  The patient was examined and consented in the preoperative holding area where the aforementioned topical anesthesia was applied to the right eye and then brought back to the Operating Room where the right eye was prepped and draped in the usual sterile ophthalmic fashion and a lid speculum was placed. A paracentesis was created with the side port blade and the anterior chamber was filled with viscoelastic. A near clear corneal incision was performed with the steel keratome. A continuous curvilinear capsulorrhexis was performed with a cystotome followed by the capsulorrhexis forceps. Hydrodissection and hydrodelineation were carried out with BSS on a blunt cannula. The lens was removed in a stop and chop  technique and the remaining cortical material was removed with the irrigation-aspiration handpiece. The capsular bag was inflated with viscoelastic and the Technis ZCB00  lens was placed in the capsular bag without complication. The remaining viscoelastic was removed from the eye with the irrigation-aspiration handpiece. The wounds were hydrated. The anterior chamber was flushed with BSS and the eye was inflated to physiologic pressure. 0.32ml of Vigamox was placed in the anterior chamber. The wounds were found to be water tight. The eye was dressed with Combigan. The patient was given protective glasses to wear throughout the day and a shield with which to sleep tonight. The patient was also given drops with which to begin a drop regimen today and  will follow-up with me in one day. Implant Name Type Inv. Item Serial No. Manufacturer Lot No. LRB No. Used Action  LENS IOL TECNIS EYHANCE 21.0 - [id number] Intraocular Lens LENS IOL TECNIS EYHANCE 21.0 [id number] [employer]  Left 1 Implanted   Procedure(s): CATARACT EXTRACTION PHACO AND INTRAOCULAR LENS PLACEMENT (IOC) LEFT 15.16 01:16.2 (Left)  Electronically signed: Galen Manila 11/19/2022 8:55 AM

## 2022-11-19 NOTE — H&P (Signed)
[facility]   Primary Care Physician:  Dale Morris, MD Ophthalmologist: Dr. Druscilla Brownie  Pre-Procedure History & Physical: HPI:  Claudia Little is a 77 y.o. female here for cataract surgery.   Past Medical History:  Diagnosis Date   Breast cancer (HCC) 12/2013   right breast cancer   Chronic anxiety    Dysrhythmia    SVT   History of kidney stones    Insomnia    Nephrolithiasis    Osteoporosis    PAC (premature atrial contraction)    Palpitations    Personal history of radiation therapy 2015   Right breast   Stage 3a chronic kidney disease (HCC)    SVT (supraventricular tachycardia)    Vertigo    pre-2019.  none since    Past Surgical History:  Procedure Laterality Date   BREAST CYST ASPIRATION Left 10+ yrs ago   neg   BREAST EXCISIONAL BIOPSY Right 12/15/2013   DCIS   BREAST LUMPECTOMY     BREAST SURGERY Right 12/15/2013   lumpectomy   BUNIONECTOMY  early '70s   COLONOSCOPY WITH PROPOFOL N/A 05/27/2017   Procedure: COLONOSCOPY WITH PROPOFOL;  Surgeon: Christena Deem, MD;  Location: [facility] ENDOSCOPY;  Service: Endoscopy;  Laterality: N/A;   DILATION AND CURETTAGE OF UTERUS  1973   after miscarriage   LITHOTRIPSY      Prior to Admission medications   Medication Sig Start Date End Date Taking? Authorizing Provider  atenolol (TENORMIN) 25 MG tablet Take 1 tablet (25 mg total) by mouth daily. 11/01/22  Yes Dale [place], MD  Cholecalciferol 25 MCG (1000 UT) tablet Take 4,000 Units by mouth daily.   Yes [provider]  Coenzyme Q10 100 MG TABS Take 1 tablet (100 mg total) by mouth daily. 09/05/15  Yes Lorie Phenix, MD  cyanocobalamin 1000 MCG tablet Take by mouth.   Yes [provider]  rosuvastatin (CRESTOR) 5 MG tablet Take 1 tablet (5 mg total) by mouth daily. 07/02/22  Yes Dale [place], MD  sertraline (ZOLOFT) 50 MG tablet Take 1 tablet (50 mg total) by mouth daily. 11/01/22  Yes Dale Garza, MD    Allergies as of 11/07/2022 -  Review Complete 11/01/2022  Allergen Reaction Noted   Oxycodone Nausea And Vomiting 07/26/2014    Family History  Problem Relation Age of Onset   Aortic stenosis Mother    Congestive Heart Failure Mother    Aortic stenosis Father    Coronary artery disease Father        Deceased from complications of atherosclerotic CAD and Atherosclerosis   Congestive Heart Failure Father    Parkinsonism Brother    Colon polyps Sister    Arthritis Maternal Grandfather    Early death Paternal Grandmother    Heart block Brother    Stroke Maternal Grandmother    Kidney disease Paternal Grandfather    Breast cancer Maternal Aunt 39   Ovarian cancer Maternal Aunt     Social History   Socioeconomic History   Marital status: Married    Spouse name: Richard   Number of children: 1   Years of education: HS   Highest education level: Not on file  Occupational History   Occupation: Retired  Tobacco Use   Smoking status: Never   Smokeless tobacco: Never  Vaping Use   Vaping status: Never Used  Substance and Sexual Activity   Alcohol use: Yes    Alcohol/week: 4.0 standard drinks of alcohol    Types: 4 Glasses of  wine per week   Drug use: No   Sexual activity: Not Currently  Other Topics Concern   Not on file  Social History Narrative   Not on file   Social Determinants of Health   Financial Resource Strain: Low Risk  (08/31/2021)   Overall Financial Resource Strain (CARDIA)    Difficulty of Paying Living Expenses: Not hard at all  Food Insecurity: No Food Insecurity (08/31/2021)   Hunger Vital Sign    Worried About Running Out of Food in the Last Year: Never true    Ran Out of Food in the Last Year: Never true  Transportation Needs: No Transportation Needs (08/31/2021)   PRAPARE - Administrator, Civil Service (Medical): No    Lack of Transportation (Non-Medical): No  Physical Activity: Insufficiently Active (08/31/2021)   Exercise Vital Sign    Days of Exercise per Week: 4  days    Minutes of Exercise per Session: 20 min  Stress: No Stress Concern Present (08/31/2021)   [employer] of Occupational Health - Occupational Stress Questionnaire    Feeling of Stress : Not at all  Social Connections: Unknown (08/31/2021)   Social Connection and Isolation Panel [NHANES]    Frequency of Communication with Friends and Family: Not on file    Frequency of Social Gatherings with Friends and Family: Not on file    Attends Religious Services: Not on file    Active Member of Clubs or Organizations: Not on file    Attends Banker Meetings: Not on file    Marital Status: Married  Intimate Partner Violence: Not At Risk (08/31/2021)   Humiliation, Afraid, Rape, and Kick questionnaire    Fear of Current or Ex-Partner: No    Emotionally Abused: No    Physically Abused: No    Sexually Abused: No    Review of Systems: See HPI, otherwise negative ROS  Physical Exam: BP (!) 104/57   Pulse (!) 57   Temp 97.7 F (36.5 C) (Temporal)   Resp 18   Ht 5\' 7"  (1.702 m)   Wt 65.8 kg   SpO2 100%   BMI 22.72 kg/m  General:   Alert, cooperative in NAD Head:  Normocephalic and atraumatic. Respiratory:  Normal work of breathing. Cardiovascular:  RRR  Impression/Plan: Claudia Little is here for cataract surgery.  Risks, benefits, limitations, and alternatives regarding cataract surgery have been reviewed with the patient.  Questions have been answered.  All parties agreeable.   Galen Manila, MD  11/19/2022, 8:31 AM

## 2022-11-19 NOTE — Transfer of Care (Signed)
Immediate Anesthesia Transfer of Care Note  Patient: Claudia Little  Procedure(s) Performed: CATARACT EXTRACTION PHACO AND INTRAOCULAR LENS PLACEMENT (IOC) LEFT 15.16 01:16.2 (Left: Eye)  Patient Location: PACU  Anesthesia Type: MAC  Level of Consciousness: awake, alert  and patient cooperative  Airway and Oxygen Therapy: Patient Spontanous Breathing and Patient connected to supplemental oxygen  Post-op Assessment: Post-op Vital signs reviewed, Patient's Cardiovascular Status Stable, Respiratory Function Stable, Patent Airway and No signs of Nausea or vomiting  Post-op Vital Signs: Reviewed and stable  Complications: No notable events documented.

## 2022-11-20 ENCOUNTER — Encounter: Payer: Self-pay | Admitting: Ophthalmology

## 2022-12-13 NOTE — Discharge Instructions (Signed)

## 2022-12-18 ENCOUNTER — Ambulatory Visit: Admission: RE | Admit: 2022-12-18 | Payer: Medicare PPO | Source: Home / Self Care | Admitting: Ophthalmology

## 2022-12-18 SURGERY — CATARACT EXTRACTION PHACO AND INTRAOCULAR LENS PLACEMENT (IOC)
Anesthesia: Topical | Laterality: Right

## 2022-12-20 ENCOUNTER — Ambulatory Visit
Admission: RE | Admit: 2022-12-20 | Discharge: 2022-12-20 | Disposition: A | Discharge status: Home / Self Care | Payer: Medicare PPO | Source: Ambulatory Visit | Attending: Internal Medicine | Admitting: Internal Medicine

## 2022-12-20 DIAGNOSIS — Z1231 Encounter for screening mammogram for malignant neoplasm of breast: Secondary | ICD-10-CM | POA: Diagnosis not present

## 2023-01-02 ENCOUNTER — Ambulatory Visit: Payer: Medicare PPO

## 2023-01-02 VITALS — Ht 66.5 in | Wt 148.0 lb

## 2023-01-02 DIAGNOSIS — Z0000 Encounter for general adult medical examination without abnormal findings: Secondary | ICD-10-CM | POA: Diagnosis not present

## 2023-01-02 NOTE — Patient Instructions (Addendum)
Ms. Bartelt , Thank you for taking time to come for your Medicare Wellness Visit. I appreciate your ongoing commitment to your health goals. Please review the following plan we discussed and let me know if I can assist you in the future.   Referrals/Orders/Follow-Ups/Clinician Recommendations: Get your flu shot at your earliest convenience. Keep up the good work!  This is a list of the screening recommended for you and due dates:  Health Maintenance  Topic Date Due   COVID-19 Vaccine (6 - 2023-24 season) 12/02/2022   Flu Shot  07/01/2023*   DEXA scan (bone density measurement)  04/26/2023   Mammogram  12/20/2023   Medicare Annual Wellness Visit  01/02/2024   DTaP/Tdap/Td vaccine (2 - Td or Tdap) 03/22/2024   Pneumonia Vaccine  Completed   Hepatitis C Screening  Completed   Zoster (Shingles) Vaccine  Completed   HPV Vaccine  Aged Out   Colon Cancer Screening  Discontinued  *Topic was postponed. The date shown is not the original due date.    Advanced directives: (In Chart) A copy of your advanced directives are scanned into your chart should your provider ever need it.  Next Medicare Annual Wellness Visit scheduled for next year: Yes, 01/08/24 @ 9:45am

## 2023-01-02 NOTE — Progress Notes (Signed)
Subjective:   Claudia Little is a 77 y.o. female who presents for Medicare Annual (Subsequent) preventive examination.  Visit Complete: Virtual  I connected with  Claudia Little on 01/02/23 by a audio enabled telemedicine application and verified that I am speaking with the correct person using two identifiers.  Patient Location: Home  Provider Location: Home Office  I discussed the limitations of evaluation and management by telemedicine. The patient expressed understanding and agreed to proceed.  Patient Medicare AWV questionnaire was completed by the patient on 01/01/23; I have confirmed that all information answered by patient is correct and no changes since this date.  Because this visit was a virtual/telehealth visit, some criteria may be missing or patient reported. Any vitals not documented were not able to be obtained and vitals that have been documented are patient reported.    Cardiac Risk Factors include: advanced age (>20men, >87 women);Other (see comment), Risk factor comments: SVT     Objective:    Today's Vitals   01/02/23 0947  Weight: 148 lb (67.1 kg)  Height: 5' 6.5" (1.689 m)   Body mass index is 23.53 kg/m.     01/02/2023    9:57 AM 11/19/2022    7:28 AM 08/31/2021    8:47 AM 08/30/2020   10:09 AM 08/25/2019   10:59 AM 06/04/2019    8:55 AM 08/22/2018   10:45 AM  Advanced Directives  Does Patient Have a Medical Advance Directive? Yes Yes Yes Yes Yes Yes Yes  Type of Estate agent of [place];Living will Healthcare Power of [place];Living will Healthcare Power of [place];Living will Healthcare Power of [employer] of [place];Living will Healthcare Power of [place];Living will Living will;Healthcare Power of Attorney  Does patient want to make changes to medical advance directive? No - Patient declined No - Patient declined No - Patient declined No - Patient declined No - Patient declined No - Patient declined No - Patient  declined  Copy of Healthcare Power of Attorney in Chart? Yes - validated most recent copy scanned in chart (See row information) No - copy requested Yes - validated most recent copy scanned in chart (See row information) No - copy requested No - copy requested No - copy requested No - copy requested    Current Medications (verified) Outpatient Encounter Medications as of 01/02/2023  Medication Sig   atenolol (TENORMIN) 25 MG tablet Take 1 tablet (25 mg total) by mouth daily.   Cholecalciferol 25 MCG (1000 UT) tablet Take 4,000 Units by mouth daily.   Coenzyme Q10 100 MG TABS Take 1 tablet (100 mg total) by mouth daily.   cyanocobalamin 1000 MCG tablet Take by mouth.   rosuvastatin (CRESTOR) 5 MG tablet Take 1 tablet (5 mg total) by mouth daily.   sertraline (ZOLOFT) 50 MG tablet Take 1 tablet (50 mg total) by mouth daily.   No facility-administered encounter medications on file as of 01/02/2023.    Allergies (verified) Oxycodone   History: Past Medical History:  Diagnosis Date   Breast cancer (HCC) 12/2013   right breast cancer   Chronic anxiety    Dysrhythmia    SVT   History of kidney stones    Insomnia    Nephrolithiasis    Osteoporosis    PAC (premature atrial contraction)    Palpitations    Personal history of radiation therapy 2015   Right breast   Stage 3a chronic kidney disease (HCC)    SVT (supraventricular tachycardia) (HCC)  Vertigo    pre-2019.  none since   Past Surgical History:  Procedure Laterality Date   BREAST CYST ASPIRATION Left 10+ yrs ago   neg   BREAST EXCISIONAL BIOPSY Right 12/15/2013   DCIS   BREAST LUMPECTOMY     BREAST SURGERY Right 12/15/2013   lumpectomy   BUNIONECTOMY  early '70s   CATARACT EXTRACTION W/PHACO Left 11/19/2022   Procedure: CATARACT EXTRACTION PHACO AND INTRAOCULAR LENS PLACEMENT (IOC) LEFT 15.16 01:16.2;  Surgeon: Galen Manila, MD;  Location: [facility];  Service: Ophthalmology;  Laterality: Left;    COLONOSCOPY WITH PROPOFOL N/A 05/27/2017   Procedure: COLONOSCOPY WITH PROPOFOL;  Surgeon: Christena Deem, MD;  Location: [facility] ENDOSCOPY;  Service: Endoscopy;  Laterality: N/A;   DILATION AND CURETTAGE OF UTERUS  1973   after miscarriage   LITHOTRIPSY     Family History  Problem Relation Age of Onset   Aortic stenosis Mother    Congestive Heart Failure Mother    Aortic stenosis Father    Coronary artery disease Father        Deceased from complications of atherosclerotic CAD and Atherosclerosis   Congestive Heart Failure Father    Parkinsonism Brother    Colon polyps Sister    Arthritis Maternal Grandfather    Early death Paternal Grandmother    Heart block Brother    Stroke Maternal Grandmother    Kidney disease Paternal Grandfather    Breast cancer Maternal Aunt 37   Ovarian cancer Maternal Aunt    Social History   Socioeconomic History   Marital status: Married    Spouse name: Richard   Number of children: 1   Years of education: HS   Highest education level: Not on file  Occupational History   Occupation: Retired  Tobacco Use   Smoking status: Never   Smokeless tobacco: Never  Vaping Use   Vaping status: Never Used  Substance and Sexual Activity   Alcohol use: Yes    Alcohol/week: 2.0 standard drinks of alcohol    Types: 2 Glasses of wine per week    Comment: 1-2 glasses of wine weekly   Drug use: No   Sexual activity: Not Currently  Other Topics Concern   Not on file  Social History Narrative   Not on file   Social Determinants of Health   Financial Resource Strain: Low Risk  (01/01/2023)   Overall Financial Resource Strain (CARDIA)    Difficulty of Paying Living Expenses: Not hard at all  Food Insecurity: No Food Insecurity (01/01/2023)   Hunger Vital Sign    Worried About Running Out of Food in the Last Year: Never true    Ran Out of Food in the Last Year: Never true  Transportation Needs: No Transportation Needs (01/01/2023)   PRAPARE -  Administrator, Civil Service (Medical): No    Lack of Transportation (Non-Medical): No  Physical Activity: Inactive (01/02/2023)   Exercise Vital Sign    Days of Exercise per Week: 0 days    Minutes of Exercise per Session: 0 min  Stress: Stress Concern Present (01/01/2023)   [employer] of Occupational Health - Occupational Stress Questionnaire    Feeling of Stress : To some extent  Social Connections: Socially Integrated (01/01/2023)   Social Connection and Isolation Panel [NHANES]    Frequency of Communication with Friends and Family: More than three times a week    Frequency of Social Gatherings with Friends and Family: Three times a week  Attends Religious Services: More than 4 times per year    Active Member of Clubs or Organizations: Yes    Attends Banker Meetings: More than 4 times per year    Marital Status: Married    Tobacco Counseling Counseling given: Not Answered   Clinical Intake:  Pre-visit preparation completed: Yes  Pain : No/denies pain     BMI - recorded: 23.53 Nutritional Status: BMI of 19-24  Normal Nutritional Risks: None Diabetes: No  How often do you need to have someone help you when you read instructions, pamphlets, or other written materials from your doctor or pharmacy?: 1 - Never  Interpreter Needed?: No  Information entered by :: Tora Kindred, CMA   Activities of Daily Living    01/01/2023    2:01 PM 11/19/2022    7:23 AM  In your present state of health, do you have any difficulty performing the following activities:  Hearing? 0   Vision? 0   Difficulty concentrating or making decisions? 0 0  Walking or climbing stairs? 0 0  Dressing or bathing? 0 0  Doing errands, shopping? 0   Preparing Food and eating ? N   Using the Toilet? N   In the past six months, have you accidently leaked urine? N   Do you have problems with loss of bowel control? N   Managing your Medications? N   Managing your  Finances? N   Housekeeping or managing your Housekeeping? N     Patient Care Team: Dale Eloy, MD as PCP - General (Internal Medicine)  Indicate any recent Medical Services you may have received from other than [facility] providers in the past year (date may be approximate).     Assessment:   This is a routine wellness examination for Shana.  Hearing/Vision screen Hearing Screening - Comments:: Denies hearing loss Vision Screening - Comments:: Gets routine eye exams   Goals Addressed               This Visit's Progress     Exercise 3x per week (30 min per time) (pt-stated)        Depression Screen    01/02/2023    9:54 AM 03/01/2022    1:28 PM 02/21/2022    1:50 PM 10/24/2021   10:35 AM 08/31/2021    8:47 AM 08/30/2020    9:54 AM 08/25/2019   10:48 AM  PHQ 2/9 Scores  PHQ - 2 Score 0 0 0 0 0 0 0  PHQ- 9 Score 0          Fall Risk    01/01/2023    2:01 PM 03/01/2022    1:28 PM 02/21/2022    1:50 PM 10/24/2021   10:34 AM 08/31/2021    8:49 AM  Fall Risk   Falls in the past year? 0 0 0 0 0  Number falls in past yr: 0 0 0  0  Injury with Fall? 0 0 0    Risk for fall due to : No Fall Risks No Fall Risks No Fall Risks No Fall Risks   Follow up Falls prevention discussed Falls evaluation completed Falls evaluation completed Falls evaluation completed Falls evaluation completed    MEDICARE RISK AT HOME: Medicare Risk at Home Any stairs in or around the home?: No If so, are there any without handrails?: No Home free of loose throw rugs in walkways, pet beds, electrical cords, etc?: No Adequate lighting in your home to reduce risk of falls?: Yes  Life alert?: No Use of a cane, walker or w/c?: No Grab bars in the bathroom?: Yes Shower chair or bench in shower?: No Elevated toilet seat or a handicapped toilet?: No  TIMED UP AND GO:  Was the test performed?  No    Cognitive Function:    08/20/2017    2:41 PM  MMSE - Mini Mental State Exam  Orientation to time 5   Orientation to Place 5  Registration 3  Attention/ Calculation 5  Recall 3  Language- name 2 objects 2  Language- repeat 1  Language- follow 3 step command 3  Language- read & follow direction 1  Write a sentence 1  Copy design 1  Total score 30        01/02/2023    9:58 AM 08/22/2018   10:48 AM  6CIT Screen  What Year? 0 points 0 points  What month? 0 points 0 points  What time? 0 points 0 points  Count back from 20 0 points 0 points  Months in reverse 0 points 0 points  Repeat phrase 0 points 0 points  Total Score 0 points 0 points    Immunizations Immunization History  Administered Date(s) Administered   Influenza Split 02/14/2012   Influenza, High Dose Seasonal PF 01/08/2019   Influenza-Unspecified 01/24/2017, 12/03/2017, 12/28/2019, 02/03/2021   PFIZER(Purple Top)SARS-COV-2 Vaccination 04/06/2019, 04/27/2019, 12/28/2019, 07/12/2020, 02/17/2021   Pneumococcal Conjugate-13 03/22/2014, 04/10/2017   Pneumococcal Polysaccharide-23 01/16/2011, 04/10/2017   Tdap 03/22/2014   Zoster Recombinant(Shingrix) 08/21/2017, 11/11/2017   Zoster, Live 07/13/2010    TDAP status: Up to date  Flu Vaccine status: Due, Education has been provided regarding the importance of this vaccine. Advised may receive this vaccine at local pharmacy or Health Dept. Aware to provide a copy of the vaccination record if obtained from local pharmacy or Health Dept. Verbalized acceptance and understanding.  Pneumococcal vaccine status: Up to date  Covid-19 vaccine status: Information provided on how to obtain vaccines.   Qualifies for Shingles Vaccine? Yes   Zostavax completed No   Shingrix Completed?: Yes  Screening Tests Health Maintenance  Topic Date Due   DEXA SCAN  12/09/2020   COVID-19 Vaccine (6 - 2023-24 season) 12/02/2022   INFLUENZA VACCINE  07/01/2023 (Originally 11/01/2022)   MAMMOGRAM  12/20/2023   Medicare Annual Wellness (AWV)  01/02/2024   DTaP/Tdap/Td (2 - Td or Tdap)  03/22/2024   Pneumonia Vaccine 36+ Years old  Completed   Hepatitis C Screening  Completed   Zoster Vaccines- Shingrix  Completed   HPV VACCINES  Aged Out   Colonoscopy  Discontinued    Health Maintenance  Health Maintenance Due  Topic Date Due   DEXA SCAN  12/09/2020   COVID-19 Vaccine (6 - 2023-24 season) 12/02/2022    Colorectal cancer screening: No longer required.   Mammogram status: Completed 12/20/22. Repeat every year  Bone Density status: Completed 04/25/21. Results reflect: Bone density results: OSTEOPOROSIS. Repeat every 2 years. [facility] follows)  Lung Cancer Screening: (Low Dose CT Chest recommended if Age 61-80 years, 20 pack-year currently smoking OR have quit w/in 15years.) does not qualify.   Lung Cancer Screening Referral: n/a  Additional Screening:  Hepatitis C Screening: does not qualify; Completed 04/16/17  Vision Screening: Recommended annual ophthalmology exams for early detection of glaucoma and other disorders of the eye.  Dental Screening: Recommended annual dental exams for proper oral hygiene   Community Resource Referral / Chronic Care Management: CRR required this visit?  No   CCM  required this visit?  No     Plan:     I have personally reviewed and noted the following in the patient's chart:   Medical and social history Use of alcohol, tobacco or illicit drugs  Current medications and supplements including opioid prescriptions. Patient is not currently taking opioid prescriptions. Functional ability and status Nutritional status Physical activity Advanced directives List of other physicians Hospitalizations, surgeries, and ER visits in previous 12 months Vitals Screenings to include cognitive, depression, and falls Referrals and appointments  In addition, I have reviewed and discussed with patient certain preventive protocols, quality metrics, and best practice recommendations. A written personalized care  plan for preventive services as well as general preventive health recommendations were provided to patient.     Tora Kindred, CMA   01/02/2023   After Visit Summary: (MyChart) Due to this being a telephonic visit, the after visit summary with patients personalized plan was offered to patient via MyChart   Nurse Notes:  Will get flu shot at earliest convenience

## 2023-02-27 ENCOUNTER — Other Ambulatory Visit (INDEPENDENT_AMBULATORY_CARE_PROVIDER_SITE_OTHER): Payer: Medicare PPO

## 2023-02-27 DIAGNOSIS — E7800 Pure hypercholesterolemia, unspecified: Secondary | ICD-10-CM

## 2023-02-27 NOTE — Addendum Note (Signed)
Addended by: Jarvis Morgan D on: 02/27/2023 03:39 PM   Modules accepted: Orders

## 2023-02-28 LAB — CBC WITH DIFFERENTIAL/PLATELET
Basophils Absolute: 0.1 10*3/uL (ref 0.0–0.2)
Basos: 1 %
EOS (ABSOLUTE): 0.6 10*3/uL — ABNORMAL HIGH (ref 0.0–0.4)
Eos: 8 %
Hematocrit: 39.4 % (ref 34.0–46.6)
Hemoglobin: 13 g/dL (ref 11.1–15.9)
Immature Grans (Abs): 0 10*3/uL (ref 0.0–0.1)
Immature Granulocytes: 0 %
Lymphocytes Absolute: 1.9 10*3/uL (ref 0.7–3.1)
Lymphs: 27 %
MCH: 31.3 pg (ref 26.6–33.0)
MCHC: 33 g/dL (ref 31.5–35.7)
MCV: 95 fL (ref 79–97)
Monocytes Absolute: 0.7 10*3/uL (ref 0.1–0.9)
Monocytes: 10 %
Neutrophils Absolute: 3.9 10*3/uL (ref 1.4–7.0)
Neutrophils: 54 %
Platelets: 248 10*3/uL (ref 150–450)
RBC: 4.16 x10E6/uL (ref 3.77–5.28)
RDW: 12.6 % (ref 11.7–15.4)
WBC: 7.2 10*3/uL (ref 3.4–10.8)

## 2023-02-28 LAB — BASIC METABOLIC PANEL
BUN/Creatinine Ratio: 23 (ref 12–28)
BUN: 19 mg/dL (ref 8–27)
CO2: 23 mmol/L (ref 20–29)
Calcium: 9.3 mg/dL (ref 8.7–10.3)
Chloride: 102 mmol/L (ref 96–106)
Creatinine, Ser: 0.84 mg/dL (ref 0.57–1.00)
Glucose: 88 mg/dL (ref 70–99)
Potassium: 4.2 mmol/L (ref 3.5–5.2)
Sodium: 140 mmol/L (ref 134–144)
eGFR: 71 mL/min/{1.73_m2} (ref >59)

## 2023-02-28 LAB — LIPID PANEL
Chol/HDL Ratio: 3.3 {ratio} (ref 0.0–4.4)
Cholesterol, Total: 184 mg/dL (ref 100–199)
HDL: 56 mg/dL (ref >39)
LDL Chol Calc (NIH): 103 mg/dL — ABNORMAL HIGH (ref 0–99)
Triglycerides: 142 mg/dL (ref 0–149)
VLDL Cholesterol Cal: 25 mg/dL (ref 5–40)

## 2023-02-28 LAB — HEPATIC FUNCTION PANEL
ALT: 22 [IU]/L (ref 0–32)
AST: 21 [IU]/L (ref 0–40)
Albumin: 4.5 g/dL (ref 3.8–4.8)
Alkaline Phosphatase: 63 [IU]/L (ref 44–121)
Bilirubin Total: 0.7 mg/dL (ref 0.0–1.2)
Bilirubin, Direct: 0.23 mg/dL (ref 0.00–0.40)
Total Protein: 7 g/dL (ref 6.0–8.5)

## 2023-02-28 LAB — TSH: TSH: 0.978 u[IU]/mL (ref 0.450–4.500)

## 2023-03-03 NOTE — Progress Notes (Unsigned)
Subjective:    Patient ID: Claudia Little, female    DOB: 02-16-1946, 77 y.o.   MRN: [id number]  Patient here for No chief complaint on file.   HPI Here for a scheduled follow up - f/u regarding SVT - on atenolol. Also increased stress with her husband's medical issues.  On zoloft. Received reclast infusion 09/19/22. Planning for f/u appt and bone density in 09/2023.    Past Medical History:  Diagnosis Date   Breast cancer (HCC) 12/2013   right breast cancer   Chronic anxiety    Dysrhythmia    SVT   History of kidney stones    Insomnia    Nephrolithiasis    Osteoporosis    PAC (premature atrial contraction)    Palpitations    Personal history of radiation therapy 2015   Right breast   Stage 3a chronic kidney disease (HCC)    SVT (supraventricular tachycardia) (HCC)    Vertigo    pre-2019.  none since   Past Surgical History:  Procedure Laterality Date   BREAST CYST ASPIRATION Left 10+ yrs ago   neg   BREAST EXCISIONAL BIOPSY Right 12/15/2013   DCIS   BREAST LUMPECTOMY     BREAST SURGERY Right 12/15/2013   lumpectomy   BUNIONECTOMY  early '70s   CATARACT EXTRACTION W/PHACO Left 11/19/2022   Procedure: CATARACT EXTRACTION PHACO AND INTRAOCULAR LENS PLACEMENT (IOC) LEFT 15.16 01:16.2;  Surgeon: Galen Manila, MD;  Location: [facility];  Service: Ophthalmology;  Laterality: Left;   COLONOSCOPY WITH PROPOFOL N/A 05/27/2017   Procedure: COLONOSCOPY WITH PROPOFOL;  Surgeon: Christena Deem, MD;  Location[facility] ENDOSCOPY;  Service: Endoscopy;  Laterality: N/A;   DILATION AND CURETTAGE OF UTERUS  1973   after miscarriage   LITHOTRIPSY     Family History  Problem Relation Age of Onset   Aortic stenosis Mother    Congestive Heart Failure Mother    Aortic stenosis Father    Coronary artery disease Father        Deceased from complications of atherosclerotic CAD and Atherosclerosis   Congestive Heart Failure Father    Parkinsonism Brother    Colon polyps  Sister    Arthritis Maternal Grandfather    Early death Paternal Grandmother    Heart block Brother    Stroke Maternal Grandmother    Kidney disease Paternal Grandfather    Breast cancer Maternal Aunt 18   Ovarian cancer Maternal Aunt    Social History   Socioeconomic History   Marital status: Married    Spouse name: Richard   Number of children: 1   Years of education: HS   Highest education level: Not on file  Occupational History   Occupation: Retired  Tobacco Use   Smoking status: Never   Smokeless tobacco: Never  Vaping Use   Vaping status: Never Used  Substance and Sexual Activity   Alcohol use: Yes    Alcohol/week: 2.0 standard drinks of alcohol    Types: 2 Glasses of wine per week    Comment: 1-2 glasses of wine weekly   Drug use: No   Sexual activity: Not Currently  Other Topics Concern   Not on file  Social History Narrative   Not on file   Social Determinants of Health   Financial Resource Strain: Low Risk  (01/01/2023)   Overall Financial Resource Strain (CARDIA)    Difficulty of Paying Living Expenses: Not hard at all  Food Insecurity: No Food Insecurity (01/01/2023)  Hunger Vital Sign    Worried About Running Out of Food in the Last Year: Never true    Ran Out of Food in the Last Year: Never true  Transportation Needs: No Transportation Needs (01/01/2023)   PRAPARE - Administrator, Civil Service (Medical): No    Lack of Transportation (Non-Medical): No  Physical Activity: Inactive (01/02/2023)   Exercise Vital Sign    Days of Exercise per Week: 0 days    Minutes of Exercise per Session: 0 min  Stress: Stress Concern Present (01/01/2023)   [employer] of Occupational Health - Occupational Stress Questionnaire    Feeling of Stress : To some extent  Social Connections: Socially Integrated (01/01/2023)   Social Connection and Isolation Panel [NHANES]    Frequency of Communication with Friends and Family: More than three times a week     Frequency of Social Gatherings with Friends and Family: Three times a week    Attends Religious Services: More than 4 times per year    Active Member of Clubs or Organizations: Yes    Attends Banker Meetings: More than 4 times per year    Marital Status: Married     Review of Systems     Objective:     There were no vitals taken for this visit. Wt Readings from Last 3 Encounters:  01/02/23 148 lb (67.1 kg)  11/19/22 145 lb 1 oz (65.8 kg)  11/01/22 149 lb (67.6 kg)    Physical Exam   Outpatient Encounter Medications as of 03/04/2023  Medication Sig   atenolol (TENORMIN) 25 MG tablet Take 1 tablet (25 mg total) by mouth daily.   Cholecalciferol 25 MCG (1000 UT) tablet Take 4,000 Units by mouth daily.   Coenzyme Q10 100 MG TABS Take 1 tablet (100 mg total) by mouth daily.   cyanocobalamin 1000 MCG tablet Take by mouth.   rosuvastatin (CRESTOR) 5 MG tablet Take 1 tablet (5 mg total) by mouth daily.   sertraline (ZOLOFT) 50 MG tablet Take 1 tablet (50 mg total) by mouth daily.   zoledronic acid (RECLAST) 5 MG/100ML SOLN injection Inject 5 mg into the vein once.   No facility-administered encounter medications on file as of 03/04/2023.     Lab Results  Component Value Date   WBC 7.2 02/27/2023   HGB 13.0 02/27/2023   HCT 39.4 02/27/2023   PLT 248 02/27/2023   GLUCOSE 88 02/27/2023   CHOL 184 02/27/2023   TRIG 142 02/27/2023   HDL 56 02/27/2023   LDLDIRECT 96.0 09/13/2020   LDLCALC 103 (H) 02/27/2023   ALT 22 02/27/2023   AST 21 02/27/2023   NA 140 02/27/2023   K 4.2 02/27/2023   CL 102 02/27/2023   CREATININE 0.84 02/27/2023   BUN 19 02/27/2023   CO2 23 02/27/2023   TSH 0.978 02/27/2023   HGBA1C 5.3 01/09/2019    MM 3D SCREENING MAMMOGRAM BILATERAL BREAST  Result Date: 12/21/2022 CLINICAL DATA:  Screening. EXAM: DIGITAL SCREENING BILATERAL MAMMOGRAM WITH TOMOSYNTHESIS AND CAD TECHNIQUE: Bilateral screening digital craniocaudal and mediolateral  oblique mammograms were obtained. Bilateral screening digital breast tomosynthesis was performed. The images were evaluated with computer-aided detection. COMPARISON:  Previous exam(s). ACR Breast Density Category c: The breasts are heterogeneously dense, which may obscure small masses. FINDINGS: There are no findings suspicious for malignancy. IMPRESSION: No mammographic evidence of malignancy. A result letter of this screening mammogram will be mailed directly to the patient. RECOMMENDATION: Screening mammogram in one year. (  Code:SM-B-01Y) BI-RADS CATEGORY  1: Negative. Electronically Signed   By: Ted Mcalpine M.D.   On: 12/21/2022 10:24       Assessment & Plan:  There are no diagnoses linked to this encounter.   [place]ale [place], MD

## 2023-03-04 ENCOUNTER — Encounter: Payer: Self-pay | Admitting: Internal Medicine

## 2023-03-04 ENCOUNTER — Other Ambulatory Visit: Payer: Medicare PPO

## 2023-03-04 ENCOUNTER — Ambulatory Visit: Payer: Medicare PPO | Admitting: Internal Medicine

## 2023-03-04 VITALS — BP 120/70 | HR 70 | Temp 98.2°F | Resp 16 | Ht 66.5 in | Wt 153.0 lb

## 2023-03-04 DIAGNOSIS — M8580 Other specified disorders of bone density and structure, unspecified site: Secondary | ICD-10-CM | POA: Diagnosis not present

## 2023-03-04 DIAGNOSIS — D0511 Intraductal carcinoma in situ of right breast: Secondary | ICD-10-CM

## 2023-03-04 DIAGNOSIS — E538 Deficiency of other specified B group vitamins: Secondary | ICD-10-CM | POA: Diagnosis not present

## 2023-03-04 DIAGNOSIS — I471 Supraventricular tachycardia, unspecified: Secondary | ICD-10-CM | POA: Diagnosis not present

## 2023-03-04 DIAGNOSIS — E7800 Pure hypercholesterolemia, unspecified: Secondary | ICD-10-CM

## 2023-03-04 DIAGNOSIS — Z860100 Personal history of colon polyps, unspecified: Secondary | ICD-10-CM

## 2023-03-04 DIAGNOSIS — F419 Anxiety disorder, unspecified: Secondary | ICD-10-CM

## 2023-03-04 MED ORDER — ROSUVASTATIN CALCIUM 10 MG PO TABS: 10.0000 mg | ORAL_TABLET | Freq: Every day | ORAL | 3 refills | Status: DC

## 2023-03-04 NOTE — Assessment & Plan Note (Signed)
reclast 09/19/22 - followed by endocrinology.

## 2023-03-04 NOTE — Assessment & Plan Note (Signed)
Colonoscopy 05/2017 - clear.  Due f/u.  Received notification. Scheduled.

## 2023-03-04 NOTE — Assessment & Plan Note (Signed)
Tolerating Crestor.  Increase crestor to 10mg  q day. Continue low-cholesterol diet and exercise.  Follow-up lipid panel liver function test.  Lab Results  Component Value Date   CHOL 184 02/27/2023   HDL 56 02/27/2023   LDLCALC 103 (H) 02/27/2023   LDLDIRECT 96.0 09/13/2020   TRIG 142 02/27/2023   CHOLHDL 3.3 02/27/2023

## 2023-03-04 NOTE — Assessment & Plan Note (Signed)
Doing better.  Feels better.  Continue zoloft at current dose.  Follow.

## 2023-03-04 NOTE — Assessment & Plan Note (Signed)
Taking B12 supplements.  Check B12 level with next labs.

## 2023-03-04 NOTE — Assessment & Plan Note (Signed)
S/p lumpectomy - right breast.  S/p XRT. ER/PR positive.  Mammogram 12/21/2020 - [facility].   Recent right breast pain - resolved.  Right breast mammogram and ultrasound 07/11/21 - [facility] I.  Screening bilateral mammogram - 12/18/21 - [facility] I.  Mammogram 12/20/22 - [facility] I.

## 2023-03-04 NOTE — Assessment & Plan Note (Signed)
Has done well on atenolol.  Continue current dose.  Follow.  She feels is stable.  

## 2023-03-06 ENCOUNTER — Ambulatory Visit: Payer: Medicare PPO | Admitting: Internal Medicine

## 2023-04-04 DIAGNOSIS — Z09 Encounter for follow-up examination after completed treatment for conditions other than malignant neoplasm: Secondary | ICD-10-CM | POA: Diagnosis not present

## 2023-04-04 DIAGNOSIS — Z860101 Personal history of adenomatous and serrated colon polyps: Secondary | ICD-10-CM | POA: Diagnosis not present

## 2023-06-25 ENCOUNTER — Encounter: Payer: Self-pay | Admitting: *Deleted

## 2023-07-02 ENCOUNTER — Ambulatory Visit: Admitting: Anesthesiology

## 2023-07-02 ENCOUNTER — Encounter
Admission: RE | Disposition: A | Discharge status: Home / Self Care | Payer: Self-pay | Source: Home / Self Care | Attending: Gastroenterology

## 2023-07-02 ENCOUNTER — Ambulatory Visit
Admission: RE | Admit: 2023-07-02 | Discharge: 2023-07-02 | Disposition: A | Discharge status: Home / Self Care | Payer: Medicare PPO | Attending: Gastroenterology | Admitting: Gastroenterology

## 2023-07-02 ENCOUNTER — Encounter: Payer: Self-pay | Admitting: *Deleted

## 2023-07-02 ENCOUNTER — Other Ambulatory Visit: Payer: Self-pay

## 2023-07-02 DIAGNOSIS — Z1211 Encounter for screening for malignant neoplasm of colon: Secondary | ICD-10-CM | POA: Insufficient documentation

## 2023-07-02 DIAGNOSIS — K640 First degree hemorrhoids: Secondary | ICD-10-CM | POA: Insufficient documentation

## 2023-07-02 DIAGNOSIS — K649 Unspecified hemorrhoids: Secondary | ICD-10-CM | POA: Diagnosis not present

## 2023-07-02 DIAGNOSIS — Z09 Encounter for follow-up examination after completed treatment for conditions other than malignant neoplasm: Secondary | ICD-10-CM | POA: Diagnosis not present

## 2023-07-02 DIAGNOSIS — F419 Anxiety disorder, unspecified: Secondary | ICD-10-CM | POA: Insufficient documentation

## 2023-07-02 DIAGNOSIS — Z860100 Personal history of colon polyps, unspecified: Secondary | ICD-10-CM | POA: Diagnosis not present

## 2023-07-02 DIAGNOSIS — Z860101 Personal history of adenomatous and serrated colon polyps: Secondary | ICD-10-CM | POA: Diagnosis not present

## 2023-07-02 DIAGNOSIS — Z800 Family history of malignant neoplasm of digestive organs: Secondary | ICD-10-CM | POA: Insufficient documentation

## 2023-07-02 DIAGNOSIS — N1831 Chronic kidney disease, stage 3a: Secondary | ICD-10-CM | POA: Insufficient documentation

## 2023-07-02 HISTORY — PX: COLONOSCOPY WITH PROPOFOL: SHX5780

## 2023-07-02 SURGERY — COLONOSCOPY WITH PROPOFOL
Anesthesia: General

## 2023-07-02 MED ORDER — EPHEDRINE 5 MG/ML INJ
INTRAVENOUS | Status: AC
  Filled 2023-07-02: qty 5

## 2023-07-02 MED ORDER — EPHEDRINE SULFATE-NACL 50-0.9 MG/10ML-% IV SOSY
PREFILLED_SYRINGE | INTRAVENOUS | Status: DC | PRN
  Administered 2023-07-02: 20 mg via INTRAVENOUS

## 2023-07-02 MED ORDER — DEXMEDETOMIDINE HCL IN NACL 80 MCG/20ML IV SOLN
INTRAVENOUS | Status: DC | PRN
Start: 2023-07-02 — End: 2023-07-02
  Administered 2023-07-02: 20 ug via INTRAVENOUS

## 2023-07-02 MED ORDER — SODIUM CHLORIDE 0.9 % IV SOLN: INTRAVENOUS | Status: DC

## 2023-07-02 MED ORDER — PROPOFOL 500 MG/50ML IV EMUL
INTRAVENOUS | Status: DC | PRN
  Administered 2023-07-02: 75 ug/kg/min via INTRAVENOUS

## 2023-07-02 MED ORDER — GLYCOPYRROLATE 0.2 MG/ML IJ SOLN
INTRAMUSCULAR | Status: DC | PRN
  Administered 2023-07-02: .2 mg via INTRAVENOUS

## 2023-07-02 MED ORDER — LIDOCAINE HCL (CARDIAC) PF 100 MG/5ML IV SOSY
PREFILLED_SYRINGE | INTRAVENOUS | Status: DC | PRN
  Administered 2023-07-02: 60 mg via INTRAVENOUS

## 2023-07-02 MED ORDER — GLYCOPYRROLATE 0.2 MG/ML IJ SOLN
INTRAMUSCULAR | Status: AC
  Filled 2023-07-02: qty 1

## 2023-07-02 MED ORDER — LIDOCAINE HCL (PF) 2 % IJ SOLN
INTRAMUSCULAR | Status: AC
  Filled 2023-07-02: qty 5

## 2023-07-02 MED ORDER — PROPOFOL 10 MG/ML IV BOLUS
INTRAVENOUS | Status: DC | PRN
  Administered 2023-07-02: 40 mg via INTRAVENOUS
  Administered 2023-07-02: 20 mg via INTRAVENOUS

## 2023-07-02 NOTE — H&P (Signed)
 Outpatient short stay form Pre-procedure 07/02/2023  Regis Bill, MD  Primary Physician: Dale [place], MD  Reason for visit:  Surveillance  History of present illness:    78 y/o lady with history of CKD, anxiety, and osteoporosis here for colonoscopy due to history of polyps. Last colonoscopy in 2019 was unremarkable. No blood thinners. No significant abdominal surgeries. Had a 2nd degree relative with colon cancer.    Current Facility-Administered Medications:    0.9 %  sodium chloride infusion, , Intravenous, Continuous, Calie Buttrey, Rossie Muskrat, MD, Last Rate: 20 mL/hr at 07/02/23 1111, New Bag at 07/02/23 1111  Medications Prior to Admission  Medication Sig Dispense Refill Last Dose/Taking   atenolol (TENORMIN) 25 MG tablet Take 1 tablet (25 mg total) by mouth daily. 90 tablet 1 07/02/2023 at  6:00 AM   Cholecalciferol 25 MCG (1000 UT) tablet Take 4,000 Units by mouth daily.   Past Week   Coenzyme Q10 100 MG TABS Take 1 tablet (100 mg total) by mouth daily. 30 tablet 0 Past Week   cyanocobalamin 1000 MCG tablet Take by mouth.   Past Week   rosuvastatin (CRESTOR) 10 MG tablet Take 1 tablet (10 mg total) by mouth daily. 90 tablet 3 07/01/2023   sertraline (ZOLOFT) 50 MG tablet Take 1 tablet (50 mg total) by mouth daily. 90 tablet 1 07/01/2023   zoledronic acid (RECLAST) 5 MG/100ML SOLN injection Inject 5 mg into the vein once.        Allergies  Allergen Reactions   Oxycodone Nausea And Vomiting     Past Medical History:  Diagnosis Date   Breast cancer (HCC) 12/2013   right breast cancer   Chronic anxiety    Dysrhythmia    SVT   History of kidney stones    Insomnia    Nephrolithiasis    Osteoporosis    PAC (premature atrial contraction)    Palpitations    Personal history of radiation therapy 2015   Right breast   Stage 3a chronic kidney disease (HCC)    SVT (supraventricular tachycardia) (HCC)    Vertigo    pre-2019.  none since    Review of systems:   Otherwise negative.    Physical Exam  Gen: Alert, oriented. Appears stated age.  HEENT: PERRLA. Lungs: No respiratory distress CV: RRR Abd: soft, benign, no masses Ext: No edema    Planned procedures: Proceed with colonoscopy. The patient understands the nature of the planned procedure, indications, risks, alternatives and potential complications including but not limited to bleeding, infection, perforation, damage to internal organs and possible oversedation/side effects from anesthesia. The patient agrees and gives consent to proceed.  Please refer to procedure notes for findings, recommendations and patient disposition/instructions.     Regis Bill, MD [facility]

## 2023-07-02 NOTE — Interval H&P Note (Signed)
 History and Physical Interval Note:  07/02/2023 12:26 PM  Claudia Little  has presented today for surgery, with the diagnosis of HX OF ADENOMATOUS POLYP OF COLON.  The various methods of treatment have been discussed with the patient and family. After consideration of risks, benefits and other options for treatment, the patient has consented to  Procedure(s): COLONOSCOPY WITH PROPOFOL (N/A) as a surgical intervention.  The patient's history has been reviewed, patient examined, no change in status, stable for surgery.  I have reviewed the patient's chart and labs.  Questions were answered to the patient's satisfaction.     Regis Bill  Ok to proceed with colonoscopy

## 2023-07-02 NOTE — Op Note (Signed)
 [facility] Gastroenterology Patient Name: Claudia Little Procedure Date: 07/02/2023 12:26 PM MRN: [id number] Account #: [id number] Date of Birth: 29-Dec-1945 Admit Type: Outpatient Age: 78 Room: [facility] ENDO ROOM 1 Gender: Female Note Status: Finalized Instrument Name: Prentice Docker [id number] Procedure:             Colonoscopy Indications:           High risk colon cancer surveillance: Personal history                         of colonic polyps, Last colonoscopy 5 years ago Providers:             Eather Colas MD, MD Referring MD:          Dale Wallace, MD (Referring MD) Medicines:             Monitored Anesthesia Care Complications:         No immediate complications. Procedure:             Pre-Anesthesia Assessment:                        - Prior to the procedure, a History and Physical was                         performed, and patient medications and allergies were                         reviewed. The patient is competent. The risks and                         benefits of the procedure and the sedation options and                         risks were discussed with the patient. All questions                         were answered and informed consent was obtained.                         Patient identification and proposed procedure were                         verified by the physician, the nurse, the                         anesthesiologist, the anesthetist and the technician                         in the endoscopy suite. Mental Status Examination:                         alert and oriented. Airway Examination: normal                         oropharyngeal airway and neck mobility. Respiratory                         Examination: clear to auscultation. CV Examination:  normal. Prophylactic Antibiotics: The patient does not                         require prophylactic antibiotics. Prior                         Anticoagulants: The patient has  taken no anticoagulant                         or antiplatelet agents. ASA Grade Assessment: II - A                         patient with mild systemic disease. After reviewing                         the risks and benefits, the patient was deemed in                         satisfactory condition to undergo the procedure. The                         anesthesia plan was to use monitored anesthesia care                         (MAC). Immediately prior to administration of                         medications, the patient was re-assessed for adequacy                         to receive sedatives. The heart rate, respiratory                         rate, oxygen saturations, blood pressure, adequacy of                         pulmonary ventilation, and response to care were                         monitored throughout the procedure. The physical                         status of the patient was re-assessed after the                         procedure.                        After obtaining informed consent, the colonoscope was                         passed under direct vision. Throughout the procedure,                         the patient's blood pressure, pulse, and oxygen                         saturations were monitored continuously. The  Colonoscope was introduced through the anus and                         advanced to the the terminal ileum, with                         identification of the appendiceal orifice and IC                         valve. The colonoscopy was performed without                         difficulty. The patient tolerated the procedure well.                         The quality of the bowel preparation was good. The                         terminal ileum, ileocecal valve, appendiceal orifice,                         and rectum were photographed. Findings:      The perianal and digital rectal examinations were normal.      The terminal ileum  appeared normal.      Internal hemorrhoids were found during retroflexion. The hemorrhoids       were Grade I (internal hemorrhoids that do not prolapse).      The exam was otherwise without abnormality on direct and retroflexion       views. Impression:            - The examined portion of the ileum was normal.                        - Internal hemorrhoids.                        - The examination was otherwise normal on direct and                         retroflexion views.                        - No specimens collected. Recommendation:        - Discharge patient to home.                        - Resume previous diet.                        - Continue present medications.                        - Repeat colonoscopy is not recommended due to current                         age (55 years or older) for surveillance.                        - Return to referring physician as previously  scheduled. Procedure Code(s):     --- Professional ---                        Z6109, Colorectal cancer screening; colonoscopy on                         individual at high risk Diagnosis Code(s):     --- Professional ---                        Z86.010, Personal history of colonic polyps                        K64.0, First degree hemorrhoids CPT copyright 2022 American Medical Association. All rights reserved. The codes documented in this report are preliminary and upon coder review may  be revised to meet current compliance requirements. Eather Colas MD, MD 07/02/2023 12:49:16 PM Number of Addenda: 0 Note Initiated On: 07/02/2023 12:26 PM Scope Withdrawal Time: 0 hours 6 minutes 49 seconds  Total Procedure Duration: 0 hours 12 minutes 10 seconds  Estimated Blood Loss:  Estimated blood loss: none.      [facility]

## 2023-07-02 NOTE — Anesthesia Preprocedure Evaluation (Signed)
 Anesthesia Evaluation  Patient identified by MRN, date of birth, ID band Patient awake    Reviewed: Allergy & Precautions, H&P , NPO status , Patient's Chart, lab work & pertinent test results  History of Anesthesia Complications Negative for: history of anesthetic complications  Airway Mallampati: II  TM Distance: >3 FB Neck ROM: Full    Dental  (+) Dental Advidsory Given, Chipped   Pulmonary neg pulmonary ROS   Pulmonary exam normal breath sounds clear to auscultation       Cardiovascular (-) hypertension(-) angina (-) Past MI and (-) Cardiac Stents Normal cardiovascular exam+ dysrhythmias (-) Valvular Problems/Murmurs Rhythm:Regular Rate:Normal  Hx SVT and PAC, but if she has ever had an echocardiogram, I can't find it, nor any reference to anything except "refills" for beta blocker and notes that beta blocker seems to be controlling the SVT.  Onset of SVT unknown to me.    Neuro/Psych   Anxiety     negative neurological ROS  negative psych ROS   GI/Hepatic negative GI ROS, Neg liver ROS,,,  Endo/Other  negative endocrine ROS    Renal/GU Renal disease  negative genitourinary   Musculoskeletal negative musculoskeletal ROS (+)    Abdominal   Peds negative pediatric ROS (+)  Hematology negative hematology ROS (+)   Anesthesia Other Findings Insomnia  Nephrolithiasis Chronic anxiety  Palpitations SVT (supraventricular tachycardia)  PAC (premature atrial contraction) Osteoporosis  History of kidney stones Dysrhythmia  Breast cancer (HCC) Personal history of radiation therapy Vertigo Stage 3a CKD   Reproductive/Obstetrics negative OB ROS                             Anesthesia Physical Anesthesia Plan  ASA: 2  Anesthesia Plan: General   Post-op Pain Management:    Induction: Intravenous  PONV Risk Score and Plan: 3 and Propofol infusion, TIVA and Treatment may vary due to age  or medical condition  Airway Management Planned: Natural Airway and Nasal Cannula  Additional Equipment:   Intra-op Plan:   Post-operative Plan:   Informed Consent: I have reviewed the patients History and Physical, chart, labs and discussed the procedure including the risks, benefits and alternatives for the proposed anesthesia with the patient or authorized representative who has indicated his/her understanding and acceptance.     Dental Advisory Given  Plan Discussed with: Anesthesiologist, CRNA and Surgeon  Anesthesia Plan Comments: (Patient consented for risks of anesthesia including but not limited to:  - adverse reactions to medications - damage to eyes, teeth, lips or other oral mucosa - nerve damage due to positioning  - sore throat or hoarseness - Damage to heart, brain, nerves, lungs, other parts of body or loss of life  Patient voiced understanding.)        Anesthesia Quick Evaluation

## 2023-07-02 NOTE — Transfer of Care (Signed)
 Immediate Anesthesia Transfer of Care Note  Patient: Claudia Little  Procedure(s) Performed: COLONOSCOPY WITH PROPOFOL  Patient Location: PACU  Anesthesia Type:General  Level of Consciousness: sedated  Airway & Oxygen Therapy: Patient Spontanous Breathing  Post-op Assessment: Report given to RN and Post -op Vital signs reviewed and stable  Post vital signs: Reviewed and stable  Last Vitals:  Vitals Value Taken Time  BP    Temp    Pulse    Resp    SpO2      Last Pain:  Vitals:   07/02/23 1107  PainSc: 0-No pain         Complications: No notable events documented.

## 2023-07-02 NOTE — Anesthesia Postprocedure Evaluation (Signed)
 Anesthesia Post Note  Patient: Claudia Little  Procedure(s) Performed: COLONOSCOPY WITH PROPOFOL  Patient location during evaluation: Endoscopy Anesthesia Type: General Level of consciousness: awake and alert Pain management: pain level controlled Vital Signs Assessment: post-procedure vital signs reviewed and stable Respiratory status: spontaneous breathing, nonlabored ventilation, respiratory function stable and patient connected to nasal cannula oxygen Cardiovascular status: blood pressure returned to baseline and stable Postop Assessment: no apparent nausea or vomiting Anesthetic complications: no   No notable events documented.   Last Vitals:  Vitals:   07/02/23 1259 07/02/23 1309  BP: (!) 120/52 122/78  Pulse: 79   Resp: 16 16  Temp:    SpO2:      Last Pain:  Vitals:   07/02/23 1259  PainSc: 0-No pain                 Lenard Simmer

## 2023-07-03 ENCOUNTER — Other Ambulatory Visit (INDEPENDENT_AMBULATORY_CARE_PROVIDER_SITE_OTHER): Payer: Medicare PPO

## 2023-07-03 ENCOUNTER — Encounter: Payer: Self-pay | Admitting: Gastroenterology

## 2023-07-03 DIAGNOSIS — E538 Deficiency of other specified B group vitamins: Secondary | ICD-10-CM | POA: Diagnosis not present

## 2023-07-03 DIAGNOSIS — E7800 Pure hypercholesterolemia, unspecified: Secondary | ICD-10-CM

## 2023-07-03 LAB — HEPATIC FUNCTION PANEL
ALT: 16 U/L (ref 0–35)
AST: 15 U/L (ref 0–37)
Albumin: 4.7 g/dL (ref 3.5–5.2)
Alkaline Phosphatase: 61 U/L (ref 39–117)
Bilirubin, Direct: 0.1 mg/dL (ref 0.0–0.3)
Total Bilirubin: 0.7 mg/dL (ref 0.2–1.2)
Total Protein: 7.3 g/dL (ref 6.0–8.3)

## 2023-07-03 LAB — BASIC METABOLIC PANEL WITH GFR
BUN: 14 mg/dL (ref 6–23)
CO2: 24 meq/L (ref 19–32)
Calcium: 9.2 mg/dL (ref 8.4–10.5)
Chloride: 106 meq/L (ref 96–112)
Creatinine, Ser: 0.97 mg/dL (ref 0.40–1.20)
GFR: 55.86 mL/min — ABNORMAL LOW (ref >60.00)
Glucose, Bld: 94 mg/dL (ref 70–99)
Potassium: 3.8 meq/L (ref 3.5–5.1)
Sodium: 140 meq/L (ref 135–145)

## 2023-07-03 LAB — LIPID PANEL
Cholesterol: 151 mg/dL (ref 0–200)
HDL: 48.8 mg/dL (ref >39.00)
LDL Cholesterol: 70 mg/dL (ref 0–99)
NonHDL: 101.94
Total CHOL/HDL Ratio: 3
Triglycerides: 159 mg/dL — ABNORMAL HIGH (ref 0.0–149.0)
VLDL: 31.8 mg/dL (ref 0.0–40.0)

## 2023-07-03 LAB — VITAMIN B12: Vitamin B-12: 853 pg/mL (ref 211–911)

## 2023-07-04 DIAGNOSIS — M3501 Sicca syndrome with keratoconjunctivitis: Secondary | ICD-10-CM | POA: Diagnosis not present

## 2023-07-04 DIAGNOSIS — Z961 Presence of intraocular lens: Secondary | ICD-10-CM | POA: Diagnosis not present

## 2023-07-04 DIAGNOSIS — Z0100 Encounter for examination of eyes and vision without abnormal findings: Secondary | ICD-10-CM | POA: Diagnosis not present

## 2023-07-04 DIAGNOSIS — H2511 Age-related nuclear cataract, right eye: Secondary | ICD-10-CM | POA: Diagnosis not present

## 2023-07-05 ENCOUNTER — Encounter: Payer: Self-pay | Admitting: Internal Medicine

## 2023-07-05 ENCOUNTER — Ambulatory Visit: Payer: Medicare PPO | Admitting: Internal Medicine

## 2023-07-05 VITALS — BP 108/64 | HR 64 | Temp 98.2°F | Resp 16 | Ht 66.5 in | Wt 152.6 lb

## 2023-07-05 DIAGNOSIS — I471 Supraventricular tachycardia, unspecified: Secondary | ICD-10-CM | POA: Diagnosis not present

## 2023-07-05 DIAGNOSIS — D0511 Intraductal carcinoma in situ of right breast: Secondary | ICD-10-CM | POA: Diagnosis not present

## 2023-07-05 DIAGNOSIS — M8580 Other specified disorders of bone density and structure, unspecified site: Secondary | ICD-10-CM

## 2023-07-05 DIAGNOSIS — F419 Anxiety disorder, unspecified: Secondary | ICD-10-CM | POA: Diagnosis not present

## 2023-07-05 DIAGNOSIS — Z860100 Personal history of colon polyps, unspecified: Secondary | ICD-10-CM

## 2023-07-05 DIAGNOSIS — R051 Acute cough: Secondary | ICD-10-CM

## 2023-07-05 DIAGNOSIS — E7800 Pure hypercholesterolemia, unspecified: Secondary | ICD-10-CM

## 2023-07-05 DIAGNOSIS — N1831 Chronic kidney disease, stage 3a: Secondary | ICD-10-CM

## 2023-07-05 MED ORDER — SERTRALINE HCL 50 MG PO TABS: 50.0000 mg | ORAL_TABLET | Freq: Every day | ORAL | 1 refills | Status: DC

## 2023-07-05 MED ORDER — PREDNISONE 10 MG PO TABS: ORAL_TABLET | ORAL | 0 refills | Status: DC

## 2023-07-05 MED ORDER — ATENOLOL 25 MG PO TABS: 25.0000 mg | ORAL_TABLET | Freq: Every day | ORAL | 1 refills | Status: DC

## 2023-07-05 NOTE — Progress Notes (Signed)
 Subjective:    Patient ID: Claudia Little, female    DOB: October 29, 1945, 78 y.o.   MRN: [id number]  Patient here for  Chief Complaint  Patient presents with   Medical Management of Chronic Issues    HPI Here for a scheduled follow up - follow up - f/u regarding SVT - on atenolol and hypercholesterolemia - on crestor. Also increased stress with her husband's medical issues. On zoloft.  Appears to be doing relatively well. Will notify me if feels needs further intervention. Received reclast infusion 09/19/22. Planning for f/u appt and bone density in 09/2023. Tries to stay active. No chest pain or sob reported. Does report that she has noticed (this week) - some increased cough. Previous ST. Occasional wheezing. No sob. Feels allergy triggered. Minimal congestion. No increased acid reflux.    Past Medical History:  Diagnosis Date   Breast cancer (HCC) 12/2013   right breast cancer   Chronic anxiety    Dysrhythmia    SVT   History of kidney stones    Insomnia    Nephrolithiasis    Osteoporosis    PAC (premature atrial contraction)    Palpitations    Personal history of radiation therapy 2015   Right breast   Stage 3a chronic kidney disease (HCC)    SVT (supraventricular tachycardia) (HCC)    Vertigo    pre-2019.  none since   Past Surgical History:  Procedure Laterality Date   BREAST CYST ASPIRATION Left 10+ yrs ago   neg   BREAST EXCISIONAL BIOPSY Right 12/15/2013   DCIS   BREAST LUMPECTOMY     BREAST SURGERY Right 12/15/2013   lumpectomy   BUNIONECTOMY  early '70s   CATARACT EXTRACTION W/PHACO Left 11/19/2022   Procedure: CATARACT EXTRACTION PHACO AND INTRAOCULAR LENS PLACEMENT (IOC) LEFT 15.16 01:16.2;  Surgeon: Galen Manila, MD;  Location: [facility];  Service: Ophthalmology;  Laterality: Left;   COLONOSCOPY WITH PROPOFOL N/A 05/27/2017   Procedure: COLONOSCOPY WITH PROPOFOL;  Surgeon: Christena Deem, MD;  Location: [facility] ENDOSCOPY;  Service: Endoscopy;   Laterality: N/A;   COLONOSCOPY WITH PROPOFOL N/A 07/02/2023   Procedure: COLONOSCOPY WITH PROPOFOL;  Surgeon: Regis Bill, MD;  Location: [facility] ENDOSCOPY;  Service: Endoscopy;  Laterality: N/A;   DILATION AND CURETTAGE OF UTERUS  1973   after miscarriage   EYE SURGERY     LITHOTRIPSY     Family History  Problem Relation Age of Onset   Aortic stenosis Mother    Congestive Heart Failure Mother    Aortic stenosis Father    Coronary artery disease Father        Deceased from complications of atherosclerotic CAD and Atherosclerosis   Congestive Heart Failure Father    Parkinsonism Brother    Colon polyps Sister    Arthritis Maternal Grandfather    Early death Paternal Grandmother    Heart block Brother    Stroke Maternal Grandmother    Kidney disease Paternal Grandfather    Breast cancer Maternal Aunt 83   Ovarian cancer Maternal Aunt    Social History   Socioeconomic History   Marital status: Married    Spouse name: Richard   Number of children: 1   Years of education: HS   Highest education level: Associate degree: occupational, Scientist, product/process development, or vocational program  Occupational History   Occupation: Retired  Tobacco Use   Smoking status: Never   Smokeless tobacco: Never  Vaping Use   Vaping status: Never Used  Substance  and Sexual Activity   Alcohol use: Yes    Alcohol/week: 2.0 standard drinks of alcohol    Types: 2 Glasses of wine per week    Comment: 1-2 glasses of wine weekly   Drug use: No   Sexual activity: Not Currently  Other Topics Concern   Not on file  Social History Narrative   Not on file   Social Drivers of Health   Financial Resource Strain: Low Risk  (07/01/2023)   Overall Financial Resource Strain (CARDIA)    Difficulty of Paying Living Expenses: Not hard at all  Food Insecurity: No Food Insecurity (07/01/2023)   Hunger Vital Sign    Worried About Running Out of Food in the Last Year: Never true    Ran Out of Food in the Last Year: Never true   Transportation Needs: No Transportation Needs (07/01/2023)   PRAPARE - Administrator, Civil Service (Medical): No    Lack of Transportation (Non-Medical): No  Physical Activity: Unknown (07/01/2023)   Exercise Vital Sign    Days of Exercise per Week: Patient declined    Minutes of Exercise per Session: 0 min  Stress: No Stress Concern Present (07/01/2023)   [employer] of Occupational Health - Occupational Stress Questionnaire    Feeling of Stress : Not at all  Social Connections: Socially Integrated (07/01/2023)   Social Connection and Isolation Panel [NHANES]    Frequency of Communication with Friends and Family: More than three times a week    Frequency of Social Gatherings with Friends and Family: More than three times a week    Attends Religious Services: More than 4 times per year    Active Member of [employer] or Organizations: Yes    Attends Engineer, structural: More than 4 times per year    Marital Status: Married     Review of Systems  Constitutional:  Negative for appetite change and unexpected weight change.  HENT:  Negative for sinus pressure.        Minimal congestion. Noticed sore throat.   Respiratory:  Positive for cough and wheezing. Negative for chest tightness and shortness of breath.   Cardiovascular:  Negative for chest pain, palpitations and leg swelling.  Gastrointestinal:  Negative for abdominal pain, diarrhea, nausea and vomiting.  Genitourinary:  Negative for difficulty urinating and dysuria.  Musculoskeletal:  Negative for joint swelling and myalgias.  Skin:  Negative for color change and rash.  Neurological:  Negative for dizziness and headaches.  Psychiatric/Behavioral:  Negative for agitation and dysphoric mood.        Objective:     BP 108/64   Pulse 64   Temp 98.2 F (36.8 C)   Resp 16   Ht 5' 6.5" (1.689 m)   Wt 152 lb 9.6 oz (69.2 kg)   SpO2 99%   BMI 24.26 kg/m  Wt Readings from Last 3 Encounters:  07/05/23  152 lb 9.6 oz (69.2 kg)  07/02/23 150 lb (68 kg)  03/04/23 153 lb (69.4 kg)    Physical Exam Vitals reviewed.  Constitutional:      General: She is not in acute distress.    Appearance: Normal appearance.  HENT:     Head: Normocephalic and atraumatic.     Right Ear: External ear normal.     Left Ear: External ear normal.  Eyes:     General: No scleral icterus.       Right eye: No discharge.  Left eye: No discharge.     Conjunctiva/sclera: Conjunctivae normal.  Neck:     Thyroid: No thyromegaly.  Cardiovascular:     Rate and Rhythm: Normal rate and regular rhythm.  Pulmonary:     Effort: No respiratory distress.     Breath sounds: Normal breath sounds.     Comments: Increased cough with forced expiration.  Abdominal:     General: Bowel sounds are normal.     Palpations: Abdomen is soft.     Tenderness: There is no abdominal tenderness.  Musculoskeletal:        General: No swelling or tenderness.     Cervical back: Neck supple. No tenderness.  Lymphadenopathy:     Cervical: No cervical adenopathy.  Skin:    Findings: No erythema or rash.  Neurological:     Mental Status: She is alert.  Psychiatric:        Mood and Affect: Mood normal.        Behavior: Behavior normal.         Outpatient Encounter Medications as of 07/05/2023  Medication Sig   predniSONE (DELTASONE) 10 MG tablet Take 4 tablets x 1 day and then decrease by 1/2 tablet per day until down to zero mg   atenolol (TENORMIN) 25 MG tablet Take 1 tablet (25 mg total) by mouth daily.   Cholecalciferol 25 MCG (1000 UT) tablet Take 4,000 Units by mouth daily.   Coenzyme Q10 100 MG TABS Take 1 tablet (100 mg total) by mouth daily.   cyanocobalamin 1000 MCG tablet Take by mouth.   rosuvastatin (CRESTOR) 10 MG tablet Take 1 tablet (10 mg total) by mouth daily.   sertraline (ZOLOFT) 50 MG tablet Take 1 tablet (50 mg total) by mouth daily.   zoledronic acid (RECLAST) 5 MG/100ML SOLN injection Inject 5 mg into  the vein once.   [DISCONTINUED] atenolol (TENORMIN) 25 MG tablet Take 1 tablet (25 mg total) by mouth daily.   [DISCONTINUED] sertraline (ZOLOFT) 50 MG tablet Take 1 tablet (50 mg total) by mouth daily.   No facility-administered encounter medications on file as of 07/05/2023.     Lab Results  Component Value Date   WBC 7.2 02/27/2023   HGB 13.0 02/27/2023   HCT 39.4 02/27/2023   PLT 248 02/27/2023   GLUCOSE 94 07/03/2023   CHOL 151 07/03/2023   TRIG 159.0 (H) 07/03/2023   HDL 48.80 07/03/2023   LDLDIRECT 96.0 09/13/2020   LDLCALC 70 07/03/2023   ALT 16 07/03/2023   AST 15 07/03/2023   NA 140 07/03/2023   K 3.8 07/03/2023   CL 106 07/03/2023   CREATININE 0.97 07/03/2023   BUN 14 07/03/2023   CO2 24 07/03/2023   TSH 0.978 02/27/2023   HGBA1C 5.3 01/09/2019       Assessment & Plan:  Anxiety Assessment & Plan: Doing better.  Feels better.  Continue zoloft at current dose. Follow. No changes today.    Hypercholesteremia Assessment & Plan: Tolerating Crestor.  Continue low-cholesterol diet and exercise.  Follow-up lipid panel liver function test. No changes in medication today.  Lab Results  Component Value Date   CHOL 151 07/03/2023   HDL 48.80 07/03/2023   LDLCALC 70 07/03/2023   LDLDIRECT 96.0 09/13/2020   TRIG 159.0 (H) 07/03/2023   CHOLHDL 3 07/03/2023    Orders: -     Lipid panel; Future -     Hepatic function panel; Future -     Basic metabolic panel with GFR; Future  Stage 3a chronic kidney disease (HCC) Assessment & Plan: Avoid antiinflammatories.  Stay hydrated.  GFR relatively stable (55). Follow metabolic panel.    Ductal carcinoma in situ (DCIS) of right breast Assessment & Plan: S/p lumpectomy - right breast.  S/p XRT. ER/PR positive.  Mammogram 12/21/2020 - Birads II.   Recent right breast pain - resolved.  Right breast mammogram and ultrasound 07/11/21 - Birads I.  Screening bilateral mammogram - 12/18/21 - Birads I.  Mammogram 12/20/22 - Birads I.      Paroxysmal SVT (supraventricular tachycardia) (HCC) Assessment & Plan: Has done well on atenolol.  Continue current dose.  Follow. Stable. No changes.    Osteopenia, unspecified location Assessment & Plan: Received reclast infusion 09/19/22. Planning for f/u appt and bone density in 09/2023   History of colon polyps Assessment & Plan: Colonoscopy 07/02/23 - internal hemorrhoids. No repeat.    Acute cough Assessment & Plan: Increased cough and symptoms as outlined. Exam as outlined. Increased cough with forced expiration. Treat with prednisone taper as directed. Steroid nasal spray. Follow.  Call with update.    Other orders -     Atenolol; Take 1 tablet (25 mg total) by mouth daily.  Dispense: 90 tablet; Refill: 1 -     Sertraline HCl; Take 1 tablet (50 mg total) by mouth daily.  Dispense: 90 tablet; Refill: 1 -     predniSONE; Take 4 tablets x 1 day and then decrease by 1/2 tablet per day until down to zero mg  Dispense: 18 tablet; Refill: 0     Dale Osgood, MD

## 2023-07-05 NOTE — Patient Instructions (Signed)
Nasacort nasal spray - 2 sprays each nostril one time per day.  Do this in the evening.   

## 2023-07-08 ENCOUNTER — Encounter: Payer: Self-pay | Admitting: Internal Medicine

## 2023-07-08 DIAGNOSIS — R059 Cough, unspecified: Secondary | ICD-10-CM | POA: Insufficient documentation

## 2023-07-08 NOTE — Assessment & Plan Note (Signed)
 S/p lumpectomy - right breast.  S/p XRT. ER/PR positive.  Mammogram 12/21/2020 - Birads II.   Recent right breast pain - resolved.  Right breast mammogram and ultrasound 07/11/21 - Birads I.  Screening bilateral mammogram - 12/18/21 - Birads I.  Mammogram 12/20/22 - Birads I.

## 2023-07-08 NOTE — Assessment & Plan Note (Signed)
 Doing better.  Feels better.  Continue zoloft at current dose. Follow. No changes today.

## 2023-07-08 NOTE — Assessment & Plan Note (Signed)
 Tolerating Crestor.  Continue low-cholesterol diet and exercise.  Follow-up lipid panel liver function test. No changes in medication today.  Lab Results  Component Value Date   CHOL 151 07/03/2023   HDL 48.80 07/03/2023   LDLCALC 70 07/03/2023   LDLDIRECT 96.0 09/13/2020   TRIG 159.0 (H) 07/03/2023   CHOLHDL 3 07/03/2023

## 2023-07-08 NOTE — Assessment & Plan Note (Signed)
 Received reclast infusion 09/19/22. Planning for f/u appt and bone density in 09/2023

## 2023-07-08 NOTE — Assessment & Plan Note (Signed)
 Colonoscopy 07/02/23 - internal hemorrhoids. No repeat.

## 2023-07-08 NOTE — Assessment & Plan Note (Signed)
 Has done well on atenolol.  Continue current dose.  Follow. Stable. No changes.

## 2023-07-08 NOTE — Assessment & Plan Note (Signed)
 Increased cough and symptoms as outlined. Exam as outlined. Increased cough with forced expiration. Treat with prednisone taper as directed. Steroid nasal spray. Follow.  Call with update.

## 2023-07-08 NOTE — Assessment & Plan Note (Signed)
 Avoid antiinflammatories.  Stay hydrated.  GFR relatively stable (55). Follow metabolic panel.

## 2023-09-03 ENCOUNTER — Other Ambulatory Visit: Payer: Self-pay | Admitting: Internal Medicine

## 2023-09-05 NOTE — Telephone Encounter (Signed)
 Copied from CRM [id number]. Topic: Clinical - Medication Question >> Sep 05, 2023  9:14 AM Juleen Oakland F wrote: Reason for CRM: Patient says the rosuvastatin  (CRESTOR ) 10 MG tablet was sent to pharmacy yesterday and per Dr. Geralyn Knee they were increasing dosage to 20mg ? She wants to confirm that is correct, if so please resend the medication with updated 20mg  dosage. Please call patient with update at [phone] (M)

## 2023-09-09 ENCOUNTER — Encounter: Payer: Self-pay | Admitting: Internal Medicine

## 2023-09-09 NOTE — Telephone Encounter (Signed)
 If she has been taking 20mg  and no problems, ok to change rx to 20mg 

## 2023-09-09 NOTE — Telephone Encounter (Signed)
 Patient stated that her cholesterol medication was increased to crestor  20 mg q day. The rx is for 10 mg per day. Last OV note in April stated: no changes in medication. She has been taking 20 mg per day. Do you want me to send in new rx for 20 mg or have her reduce back down to 10 mg q day?

## 2023-09-10 MED ORDER — ROSUVASTATIN CALCIUM 20 MG PO TABS: 20.0000 mg | ORAL_TABLET | Freq: Every day | ORAL | 1 refills | Status: DC

## 2023-09-11 DIAGNOSIS — M810 Age-related osteoporosis without current pathological fracture: Secondary | ICD-10-CM | POA: Diagnosis not present

## 2023-09-25 DIAGNOSIS — M810 Age-related osteoporosis without current pathological fracture: Secondary | ICD-10-CM | POA: Diagnosis not present

## 2023-10-16 DIAGNOSIS — M810 Age-related osteoporosis without current pathological fracture: Secondary | ICD-10-CM | POA: Diagnosis not present

## 2023-10-30 DIAGNOSIS — D2261 Melanocytic nevi of right upper limb, including shoulder: Secondary | ICD-10-CM | POA: Diagnosis not present

## 2023-10-30 DIAGNOSIS — L821 Other seborrheic keratosis: Secondary | ICD-10-CM | POA: Diagnosis not present

## 2023-10-30 DIAGNOSIS — L728 Other follicular cysts of the skin and subcutaneous tissue: Secondary | ICD-10-CM | POA: Diagnosis not present

## 2023-10-30 DIAGNOSIS — Z85828 Personal history of other malignant neoplasm of skin: Secondary | ICD-10-CM | POA: Diagnosis not present

## 2023-10-30 DIAGNOSIS — L570 Actinic keratosis: Secondary | ICD-10-CM | POA: Diagnosis not present

## 2023-10-30 DIAGNOSIS — D2271 Melanocytic nevi of right lower limb, including hip: Secondary | ICD-10-CM | POA: Diagnosis not present

## 2023-10-30 DIAGNOSIS — D2262 Melanocytic nevi of left upper limb, including shoulder: Secondary | ICD-10-CM | POA: Diagnosis not present

## 2023-10-30 DIAGNOSIS — D2272 Melanocytic nevi of left lower limb, including hip: Secondary | ICD-10-CM | POA: Diagnosis not present

## 2023-11-01 ENCOUNTER — Ambulatory Visit: Payer: Self-pay | Admitting: Internal Medicine

## 2023-11-01 ENCOUNTER — Other Ambulatory Visit (INDEPENDENT_AMBULATORY_CARE_PROVIDER_SITE_OTHER)

## 2023-11-01 DIAGNOSIS — E7800 Pure hypercholesterolemia, unspecified: Secondary | ICD-10-CM | POA: Diagnosis not present

## 2023-11-01 LAB — BASIC METABOLIC PANEL WITH GFR
BUN: 13 mg/dL (ref 6–23)
CO2: 29 meq/L (ref 19–32)
Calcium: 9 mg/dL (ref 8.4–10.5)
Chloride: 104 meq/L (ref 96–112)
Creatinine, Ser: 1.04 mg/dL (ref 0.40–1.20)
GFR: 51.26 mL/min — ABNORMAL LOW (ref >60.00)
Glucose, Bld: 106 mg/dL — ABNORMAL HIGH (ref 70–99)
Potassium: 4.3 meq/L (ref 3.5–5.1)
Sodium: 140 meq/L (ref 135–145)

## 2023-11-01 LAB — LIPID PANEL
Cholesterol: 129 mg/dL (ref 0–200)
HDL: 45.7 mg/dL (ref >39.00)
LDL Cholesterol: 52 mg/dL (ref 0–99)
NonHDL: 83.52
Total CHOL/HDL Ratio: 3
Triglycerides: 160 mg/dL — ABNORMAL HIGH (ref 0.0–149.0)
VLDL: 32 mg/dL (ref 0.0–40.0)

## 2023-11-01 LAB — HEPATIC FUNCTION PANEL
ALT: 19 U/L (ref 0–35)
AST: 20 U/L (ref 0–37)
Albumin: 4.3 g/dL (ref 3.5–5.2)
Alkaline Phosphatase: 52 U/L (ref 39–117)
Bilirubin, Direct: 0.1 mg/dL (ref 0.0–0.3)
Total Bilirubin: 0.7 mg/dL (ref 0.2–1.2)
Total Protein: 7.1 g/dL (ref 6.0–8.3)

## 2023-11-05 ENCOUNTER — Ambulatory Visit (INDEPENDENT_AMBULATORY_CARE_PROVIDER_SITE_OTHER): Admitting: Internal Medicine

## 2023-11-05 ENCOUNTER — Encounter: Payer: Self-pay | Admitting: Internal Medicine

## 2023-11-05 VITALS — BP 110/62 | HR 61 | Temp 98.0°F | Ht 66.0 in | Wt 153.0 lb

## 2023-11-05 DIAGNOSIS — Z1231 Encounter for screening mammogram for malignant neoplasm of breast: Secondary | ICD-10-CM

## 2023-11-05 DIAGNOSIS — Z0000 Encounter for general adult medical examination without abnormal findings: Secondary | ICD-10-CM

## 2023-11-05 DIAGNOSIS — F419 Anxiety disorder, unspecified: Secondary | ICD-10-CM | POA: Diagnosis not present

## 2023-11-05 DIAGNOSIS — E7800 Pure hypercholesterolemia, unspecified: Secondary | ICD-10-CM

## 2023-11-05 DIAGNOSIS — M8580 Other specified disorders of bone density and structure, unspecified site: Secondary | ICD-10-CM | POA: Diagnosis not present

## 2023-11-05 DIAGNOSIS — Z860100 Personal history of colon polyps, unspecified: Secondary | ICD-10-CM | POA: Diagnosis not present

## 2023-11-05 DIAGNOSIS — D0511 Intraductal carcinoma in situ of right breast: Secondary | ICD-10-CM | POA: Diagnosis not present

## 2023-11-05 DIAGNOSIS — I471 Supraventricular tachycardia, unspecified: Secondary | ICD-10-CM

## 2023-11-05 DIAGNOSIS — N1831 Chronic kidney disease, stage 3a: Secondary | ICD-10-CM | POA: Diagnosis not present

## 2023-11-05 NOTE — Assessment & Plan Note (Addendum)
 Physical today 11/05/23.  Mammogram - 12/20/22 - Birads I.  Scheduled for f/u mammogram 12/26/23. Colonoscopy 05/2017 - normal.

## 2023-11-05 NOTE — Progress Notes (Signed)
 Subjective:    Patient ID: Claudia Little, female    DOB: Apr 26, 1945, 78 y.o.   MRN: [id number]  Patient here for  Chief Complaint  Patient presents with   Annual Exam    HPI Here for a physical exam. Saw Dr Cherilyn - 09/25/23 - f/u osteoporosis. Recommended continuing reclast . Last infusion - 10/16/23. Continues on atenolol  - SVT - controlled. Has been dealing with increased stress regarding her husband's health issues. Continues on zoloft . Husband is doing some better. Overall handling stress. No chest pain or sob reported. Trying to stay active. No abdominal pain or bowel change reported. She does report - right upper arm pain. Notices with certain position changes and if rolls over on her arm at night. No known injury or trauma.    Past Medical History:  Diagnosis Date   Breast cancer (HCC) 12/2013   right breast cancer   Chronic anxiety    Dysrhythmia    SVT   History of kidney stones    Insomnia    Nephrolithiasis    Osteoporosis    PAC (premature atrial contraction)    Palpitations    Personal history of radiation therapy 2015   Right breast   Stage 3a chronic kidney disease (HCC)    SVT (supraventricular tachycardia) (HCC)    Vertigo    pre-2019.  none since   Past Surgical History:  Procedure Laterality Date   BREAST CYST ASPIRATION Left 10+ yrs ago   neg   BREAST EXCISIONAL BIOPSY Right 12/15/2013   DCIS   BREAST LUMPECTOMY     BREAST SURGERY Right 12/15/2013   lumpectomy   BUNIONECTOMY  early '70s   CATARACT EXTRACTION W/PHACO Left 11/19/2022   Procedure: CATARACT EXTRACTION PHACO AND INTRAOCULAR LENS PLACEMENT (IOC) LEFT 15.16 01:16.2;  Surgeon: Jaye Fallow, MD;  Location: [facility];  Service: Ophthalmology;  Laterality: Left;   COLONOSCOPY WITH PROPOFOL  N/A 05/27/2017   Procedure: COLONOSCOPY WITH PROPOFOL ;  Surgeon: Gaylyn Gladis PENNER, MD;  Location: [facility] ENDOSCOPY;  Service: Endoscopy;  Laterality: N/A;   COLONOSCOPY WITH PROPOFOL  N/A  07/02/2023   Procedure: COLONOSCOPY WITH PROPOFOL ;  Surgeon: Maryruth Ole DASEN, MD;  Location: [facility] ENDOSCOPY;  Service: Endoscopy;  Laterality: N/A;   DILATION AND CURETTAGE OF UTERUS  1973   after miscarriage   EYE SURGERY     LITHOTRIPSY     Family History  Problem Relation Age of Onset   Aortic stenosis Mother    Congestive Heart Failure Mother    Aortic stenosis Father    Coronary artery disease Father        Deceased from complications of atherosclerotic CAD and Atherosclerosis   Congestive Heart Failure Father    Parkinsonism Brother    Colon polyps Sister    Arthritis Maternal Grandfather    Early death Paternal Grandmother    Heart block Brother    Stroke Maternal Grandmother    Kidney disease Paternal Grandfather    Breast cancer Maternal Aunt 31   Ovarian cancer Maternal Aunt    Social History   Socioeconomic History   Marital status: Married    Spouse name: Richard   Number of children: 1   Years of education: HS   Highest education level: Associate degree: occupational, Scientist, product/process development, or vocational program  Occupational History   Occupation: Retired  Tobacco Use   Smoking status: Never   Smokeless tobacco: Never  Vaping Use   Vaping status: Never Used  Substance and Sexual Activity   Alcohol  use: Yes    Alcohol/week: 2.0 standard drinks of alcohol    Types: 2 Glasses of wine per week    Comment: 1-2 glasses of wine weekly   Drug use: No   Sexual activity: Not Currently  Other Topics Concern   Not on file  Social History Narrative   Not on file   Social Drivers of Health   Financial Resource Strain: Low Risk  (07/01/2023)   Overall Financial Resource Strain (CARDIA)    Difficulty of Paying Living Expenses: Not hard at all  Food Insecurity: No Food Insecurity (07/01/2023)   Hunger Vital Sign    Worried About Running Out of Food in the Last Year: Never true    Ran Out of Food in the Last Year: Never true  Transportation Needs: No Transportation Needs  (07/01/2023)   PRAPARE - Administrator, Civil Service (Medical): No    Lack of Transportation (Non-Medical): No  Physical Activity: Unknown (07/01/2023)   Exercise Vital Sign    Days of Exercise per Week: Patient declined    Minutes of Exercise per Session: 0 min  Stress: No Stress Concern Present (07/01/2023)   [employer] of Occupational Health - Occupational Stress Questionnaire    Feeling of Stress : Not at all  Social Connections: Socially Integrated (07/01/2023)   Social Connection and Isolation Panel    Frequency of Communication with Friends and Family: More than three times a week    Frequency of Social Gatherings with Friends and Family: More than three times a week    Attends Religious Services: More than 4 times per year    Active Member of [employer] or Organizations: Yes    Attends Engineer, structural: More than 4 times per year    Marital Status: Married     Review of Systems  Constitutional:  Negative for appetite change and unexpected weight change.  HENT:  Negative for congestion and sinus pressure.   Respiratory:  Negative for cough, chest tightness and shortness of breath.   Cardiovascular:  Negative for chest pain, palpitations and leg swelling.  Gastrointestinal:  Negative for abdominal pain, diarrhea, nausea and vomiting.  Genitourinary:  Negative for difficulty urinating and dysuria.  Musculoskeletal:  Negative for myalgias.       Right arm pain as outlined.   Skin:  Negative for color change and rash.  Neurological:  Negative for dizziness and headaches.  Psychiatric/Behavioral:  Negative for agitation and dysphoric mood.        Objective:     BP 110/62 (BP Location: Left Arm, Patient Position: Sitting, Cuff Size: Normal)   Pulse 61   Temp 98 F (36.7 C) (Oral)   Ht 5' 6 (1.676 m)   Wt 153 lb (69.4 kg)   SpO2 95%   BMI 24.69 kg/m  Wt Readings from Last 3 Encounters:  11/05/23 153 lb (69.4 kg)  07/05/23 152 lb 9.6 oz (69.2  kg)  07/02/23 150 lb (68 kg)    Physical Exam Vitals reviewed.  Constitutional:      General: She is not in acute distress.    Appearance: Normal appearance. She is well-developed.  HENT:     Head: Normocephalic and atraumatic.     Right Ear: External ear normal.     Left Ear: External ear normal.     Mouth/Throat:     Pharynx: No oropharyngeal exudate or posterior oropharyngeal erythema.  Eyes:     General: No scleral icterus.  Right eye: No discharge.        Left eye: No discharge.     Conjunctiva/sclera: Conjunctivae normal.  Neck:     Thyroid : No thyromegaly.  Cardiovascular:     Rate and Rhythm: Normal rate and regular rhythm.  Pulmonary:     Effort: No tachypnea, accessory muscle usage or respiratory distress.     Breath sounds: Normal breath sounds. No decreased breath sounds or wheezing.  Chest:  Breasts:    Right: No inverted nipple, mass, nipple discharge or tenderness (no axillary adenopathy).     Left: No inverted nipple, mass, nipple discharge or tenderness (no axilarry adenopathy).  Abdominal:     General: Bowel sounds are normal.     Palpations: Abdomen is soft.     Tenderness: There is no abdominal tenderness.  Musculoskeletal:        General: No swelling or tenderness.     Cervical back: Neck supple.     Comments: No increased pain with abduction. Some increased pain - adduction.   Lymphadenopathy:     Cervical: No cervical adenopathy.  Skin:    Findings: No erythema or rash.  Neurological:     Mental Status: She is alert and oriented to person, place, and time.  Psychiatric:        Mood and Affect: Mood normal.        Behavior: Behavior normal.         Outpatient Encounter Medications as of 11/05/2023  Medication Sig   atenolol  (TENORMIN ) 25 MG tablet Take 1 tablet (25 mg total) by mouth daily.   Cholecalciferol  25 MCG (1000 UT) tablet Take 4,000 Units by mouth daily.   Coenzyme Q10 100 MG TABS Take 1 tablet (100 mg total) by mouth daily.    cyanocobalamin  1000 MCG tablet Take by mouth.   rosuvastatin  (CRESTOR ) 20 MG tablet Take 1 tablet (20 mg total) by mouth daily.   sertraline  (ZOLOFT ) 50 MG tablet Take 1 tablet (50 mg total) by mouth daily.   zoledronic  acid (RECLAST ) 5 MG/100ML SOLN injection Inject 5 mg into the vein once.   [DISCONTINUED] predniSONE  (DELTASONE ) 10 MG tablet Take 4 tablets x 1 day and then decrease by 1/2 tablet per day until down to zero mg   No facility-administered encounter medications on file as of 11/05/2023.     Lab Results  Component Value Date   WBC 7.2 02/27/2023   HGB 13.0 02/27/2023   HCT 39.4 02/27/2023   PLT 248 02/27/2023   GLUCOSE 106 (H) 11/01/2023   CHOL 129 11/01/2023   TRIG 160.0 (H) 11/01/2023   HDL 45.70 11/01/2023   LDLDIRECT 96.0 09/13/2020   LDLCALC 52 11/01/2023   ALT 19 11/01/2023   AST 20 11/01/2023   NA 140 11/01/2023   K 4.3 11/01/2023   CL 104 11/01/2023   CREATININE 1.04 11/01/2023   BUN 13 11/01/2023   CO2 29 11/01/2023   TSH 0.978 02/27/2023   HGBA1C 5.3 01/09/2019       Assessment & Plan:  Routine general medical examination at a health care facility  Healthcare maintenance Assessment & Plan: Physical today 11/05/23.  Mammogram - 12/20/22 - Birads I.  Scheduled for f/u mammogram 12/26/23. Colonoscopy 05/2017 - normal.    Encounter for screening mammogram for malignant neoplasm of breast -     3D Screening Mammogram, Left and Right; Future  Stage 3a chronic kidney disease (HCC) Assessment & Plan: Avoid antiinflammatories.  Stay hydrated.  GFR recent check 51. Follow  metabolic panel.   Orders: -     Basic metabolic panel with GFR; Future -     Urinalysis, Routine w reflex microscopic; Future  Anxiety Assessment & Plan: Overall appears to be doing better. Continues on zoloft . No changes today. Follow.    Ductal carcinoma in situ (DCIS) of right breast Assessment & Plan: S/p lumpectomy - right breast.  S/p XRT. ER/PR positive.  Mammogram  12/21/2020 - Birads II.   Recent right breast pain - resolved.  Right breast mammogram and ultrasound 07/11/21 - Birads I.  Screening bilateral mammogram - 12/18/21 - Birads I.  Mammogram 12/20/22 - Birads I.  Scheduled for f/u mammogram 12/26/23.    Paroxysmal SVT (supraventricular tachycardia) (HCC) Assessment & Plan: Has done well on atenolol .  Continue current dose.  Follow. Stable. No changes today.    Osteopenia, unspecified location Assessment & Plan:  Saw Dr Cherilyn - 09/25/23 - f/u osteoporosis. Recommended continuing reclast . Last infusion - 10/16/23.    Hypercholesteremia Assessment & Plan: Tolerating Crestor .  Continue low-cholesterol diet and exercise.  Follow-up lipid panel liver function test. No changes in medication today.  Lab Results  Component Value Date   CHOL 129 11/01/2023   HDL 45.70 11/01/2023   LDLCALC 52 11/01/2023   LDLDIRECT 96.0 09/13/2020   TRIG 160.0 (H) 11/01/2023   CHOLHDL 3 11/01/2023     History of colon polyps Assessment & Plan: Colonoscopy 07/02/23 - internal hemorrhoids. No repeat.       Allena Hamilton, MD

## 2023-11-10 ENCOUNTER — Encounter: Payer: Self-pay | Admitting: Internal Medicine

## 2023-11-10 NOTE — Assessment & Plan Note (Signed)
 Tolerating Crestor .  Continue low-cholesterol diet and exercise.  Follow-up lipid panel liver function test. No changes in medication today.  Lab Results  Component Value Date   CHOL 129 11/01/2023   HDL 45.70 11/01/2023   LDLCALC 52 11/01/2023   LDLDIRECT 96.0 09/13/2020   TRIG 160.0 (H) 11/01/2023   CHOLHDL 3 11/01/2023

## 2023-11-10 NOTE — Assessment & Plan Note (Signed)
 Colonoscopy 07/02/23 - internal hemorrhoids. No repeat.

## 2023-11-10 NOTE — Assessment & Plan Note (Signed)
 Overall appears to be doing better. Continues on zoloft . No changes today. Follow.

## 2023-11-10 NOTE — Assessment & Plan Note (Signed)
 Avoid antiinflammatories.  Stay hydrated.  GFR recent check 51. Follow metabolic panel.

## 2023-11-10 NOTE — Assessment & Plan Note (Signed)
 S/p lumpectomy - right breast.  S/p XRT. ER/PR positive.  Mammogram 12/21/2020 - Birads II.   Recent right breast pain - resolved.  Right breast mammogram and ultrasound 07/11/21 - Birads I.  Screening bilateral mammogram - 12/18/21 - Birads I.  Mammogram 12/20/22 - Birads I.  Scheduled for f/u mammogram 12/26/23.

## 2023-11-10 NOTE — Assessment & Plan Note (Signed)
 Saw Dr Cherilyn - 09/25/23 - f/u osteoporosis. Recommended continuing reclast . Last infusion - 10/16/23.

## 2023-11-10 NOTE — Assessment & Plan Note (Signed)
 Has done well on atenolol .  Continue current dose.  Follow. Stable. No changes today.

## 2023-12-03 ENCOUNTER — Other Ambulatory Visit (INDEPENDENT_AMBULATORY_CARE_PROVIDER_SITE_OTHER)

## 2023-12-03 ENCOUNTER — Telehealth: Payer: Self-pay

## 2023-12-03 DIAGNOSIS — N1831 Chronic kidney disease, stage 3a: Secondary | ICD-10-CM | POA: Diagnosis not present

## 2023-12-03 LAB — URINALYSIS, ROUTINE W REFLEX MICROSCOPIC
Bilirubin Urine: NEGATIVE
Hgb urine dipstick: NEGATIVE
Ketones, ur: NEGATIVE
Nitrite: NEGATIVE
Specific Gravity, Urine: 1.015 (ref 1.000–1.030)
Total Protein, Urine: NEGATIVE
Urine Glucose: NEGATIVE
Urobilinogen, UA: 0.2 (ref 0.0–1.0)
pH: 6.5 (ref 5.0–8.0)

## 2023-12-03 LAB — BASIC METABOLIC PANEL WITH GFR
BUN: 19 mg/dL (ref 6–23)
CO2: 28 meq/L (ref 19–32)
Calcium: 8.9 mg/dL (ref 8.4–10.5)
Chloride: 102 meq/L (ref 96–112)
Creatinine, Ser: 1.02 mg/dL (ref 0.40–1.20)
GFR: 52.43 mL/min — ABNORMAL LOW (ref >60.00)
Glucose, Bld: 105 mg/dL — ABNORMAL HIGH (ref 70–99)
Potassium: 4.2 meq/L (ref 3.5–5.1)
Sodium: 139 meq/L (ref 135–145)

## 2023-12-03 NOTE — Telephone Encounter (Signed)
 LM for patient. I am not sure what note she is referring to

## 2023-12-03 NOTE — Telephone Encounter (Signed)
 Copied from CRM [id number]. Topic: Clinical - Lab/Test Results >> Dec 03, 2023 11:30 AM Claudia Little wrote: Reason for CRM: Patient would like a call back today regarding lab results, says the notes say stage 3 cancer? Please call [phone] (M)

## 2023-12-03 NOTE — Telephone Encounter (Unsigned)
 Copied from CRM #[id number]. Topic: Clinical - Lab/Test Results >> Dec 03, 2023  3:19 PM Mercedes MATSU wrote: Reason for CRM: Patient called in requesting to speak with Nurse Sueanne. Patient states she can be reached at [phone].

## 2023-12-04 ENCOUNTER — Ambulatory Visit: Payer: Self-pay | Admitting: Internal Medicine

## 2023-12-04 NOTE — Telephone Encounter (Signed)
 See result note.

## 2023-12-06 ENCOUNTER — Encounter: Payer: Self-pay | Admitting: Internal Medicine

## 2023-12-07 NOTE — Telephone Encounter (Signed)
 See phone message regarding labs.  Tried to call her and left her a couple of messages. See phone note.

## 2023-12-09 NOTE — Telephone Encounter (Signed)
 Copied from CRM [id number]. Topic: Clinical - Lab/Test Results >> Dec 09, 2023 12:51 PM Tysheama G wrote: Reason for CRM: Patient had follow up questions about her lab results and would like if Dr.Scott can call her. Callback number [phone]

## 2023-12-26 ENCOUNTER — Ambulatory Visit
Admission: RE | Admit: 2023-12-26 | Discharge: 2023-12-26 | Disposition: A | Discharge status: Home / Self Care | Source: Ambulatory Visit | Attending: Internal Medicine | Admitting: Internal Medicine

## 2023-12-26 DIAGNOSIS — Z1231 Encounter for screening mammogram for malignant neoplasm of breast: Secondary | ICD-10-CM | POA: Diagnosis not present

## 2024-01-18 ENCOUNTER — Other Ambulatory Visit: Payer: Self-pay | Admitting: Internal Medicine

## 2024-01-21 ENCOUNTER — Ambulatory Visit

## 2024-01-21 VITALS — Ht 66.0 in | Wt 153.0 lb

## 2024-01-21 DIAGNOSIS — Z0000 Encounter for general adult medical examination without abnormal findings: Secondary | ICD-10-CM

## 2024-01-21 NOTE — Patient Instructions (Signed)
 Claudia Little,  Thank you for taking the time for your Medicare Wellness Visit. I appreciate your continued commitment to your health goals. Please review the care plan we discussed, and feel free to reach out if I can assist you further.  Medicare recommends these wellness visits once per year to help you and your care team stay ahead of potential health issues. These visits are designed to focus on prevention, allowing your provider to concentrate on managing your acute and chronic conditions during your regular appointments.  Please note that Annual Wellness Visits do not include a physical exam. Some assessments may be limited, especially if the visit was conducted virtually. If needed, we may recommend a separate in-person follow-up with your provider.  Ongoing Care Seeing your primary care provider every 3 to 6 months helps us  monitor your health and provide consistent, personalized care.   Referrals If a referral was made during today's visit and you haven't received any updates within two weeks, please contact the referred provider directly to check on the status.  Recommended Screenings:  Health Maintenance  Topic Date Due   DEXA scan (bone density measurement)  04/26/2023   COVID-19 Vaccine (9 - 2025-26 season) 12/02/2023   DTaP/Tdap/Td vaccine (2 - Td or Tdap) 03/22/2024   Breast Cancer Screening  12/25/2024   Medicare Annual Wellness Visit  01/20/2025   Pneumococcal Vaccine for age over 42  Completed   Flu Shot  Completed   Hepatitis C Screening  Completed   Zoster (Shingles) Vaccine  Completed   Meningitis B Vaccine  Aged Out   Colon Cancer Screening  Discontinued       01/21/2024   11:29 AM  Advanced Directives  Does Patient Have a Medical Advance Directive? Yes  Type of Estate agent of [employer];Living will  Does patient want to make changes to medical advance directive? No - Patient declined  Copy of Healthcare Power of Attorney in Chart? Yes -  validated most recent copy scanned in chart (See row information)   Advance Care Planning is important because it: Ensures you receive medical care that aligns with your values, goals, and preferences. Provides guidance to your family and loved ones, reducing the emotional burden of decision-making during critical moments.  Vision: Annual vision screenings are recommended for early detection of glaucoma, cataracts, and diabetic retinopathy. These exams can also reveal signs of chronic conditions such as diabetes and high blood pressure.  Dental: Annual dental screenings help detect early signs of oral cancer, gum disease, and other conditions linked to overall health, including heart disease and diabetes.  Please see the attached documents for additional preventive care recommendations.

## 2024-01-21 NOTE — Progress Notes (Signed)
 Subjective:   Claudia Little is a 78 y.o. female who presents for Medicare Annual (Subsequent) preventive examination.  Visit Complete: Virtual I connected with  Claudia Little on 01/21/24 by a video and audio enabled telemedicine application and verified that I am speaking with the correct person using two identifiers.  Patient Location: Home  Provider Location: Home Office  I discussed the limitations of evaluation and management by telemedicine. The patient expressed understanding and agreed to proceed.  Vital Signs: Because this visit was a virtual/telehealth visit, some criteria may be missing or patient reported. Any vitals not documented were not able to be obtained and vitals that have been documented are patient reported.  Patient Medicare AWV questionnaire was completed by the patient on 01/20/2024; I have confirmed that all information answered by patient is correct and no changes since this date.  Cardiac Risk Factors include: advanced age (>25men, >80 women);Other (see comment)     Objective:    Today's Vitals   01/21/24 1125  Weight: 153 lb (69.4 kg)  Height: 5' 6 (1.676 m)   Body mass index is 24.69 kg/m.     01/21/2024   11:29 AM 07/02/2023   11:05 AM 01/02/2023    9:57 AM 11/19/2022    7:28 AM 08/31/2021    8:47 AM 08/30/2020   10:09 AM 08/25/2019   10:59 AM  Advanced Directives  Does Patient Have a Medical Advance Directive? Yes Yes Yes Yes Yes Yes Yes  Type of Estate agent of [place];Living will Healthcare Power of [place];Living will Healthcare Power of [place];Living will Healthcare Power of [place];Living will Healthcare Power of [place];Living will Healthcare Power of [employer] of [place];Living will  Does patient want to make changes to medical advance directive? No - Patient declined  No - Patient declined No - Patient declined No - Patient declined No - Patient declined No - Patient declined  Copy of Healthcare  Power of Attorney in Chart? Yes - validated most recent copy scanned in chart (See row information) Yes - validated most recent copy scanned in chart (See row information) Yes - validated most recent copy scanned in chart (See row information) No - copy requested Yes - validated most recent copy scanned in chart (See row information) No - copy requested No - copy requested    Current Medications (verified) Outpatient Encounter Medications as of 01/21/2024  Medication Sig   atenolol  (TENORMIN ) 25 MG tablet Take 1 tablet (25 mg total) by mouth daily.   Cholecalciferol  25 MCG (1000 UT) tablet Take 4,000 Units by mouth daily.   Coenzyme Q10 100 MG TABS Take 1 tablet (100 mg total) by mouth daily.   cyanocobalamin  1000 MCG tablet Take by mouth.   rosuvastatin  (CRESTOR ) 20 MG tablet TAKE 1 TABLET BY MOUTH DAILY   sertraline  (ZOLOFT ) 50 MG tablet TAKE 1 TABLET BY MOUTH DAILY   zoledronic  acid (RECLAST ) 5 MG/100ML SOLN injection Inject 5 mg into the vein once.   No facility-administered encounter medications on file as of 01/21/2024.    Allergies (verified) Oxycodone   History: Past Medical History:  Diagnosis Date   Breast cancer (HCC) 12/2013   right breast cancer   Chronic anxiety    Dysrhythmia    SVT   History of kidney stones    Insomnia    Nephrolithiasis    Osteoporosis    PAC (premature atrial contraction)    Palpitations    Personal history of radiation therapy 2015  Right breast   Stage 3a chronic kidney disease (HCC)    SVT (supraventricular tachycardia)    Vertigo    pre-2019.  none since   Past Surgical History:  Procedure Laterality Date   BREAST CYST ASPIRATION Left 10+ yrs ago   neg   BREAST EXCISIONAL BIOPSY Right 12/15/2013   DCIS   BREAST LUMPECTOMY     BREAST SURGERY Right 12/15/2013   lumpectomy   BUNIONECTOMY  early '70s   CATARACT EXTRACTION W/PHACO Left 11/19/2022   Procedure: CATARACT EXTRACTION PHACO AND INTRAOCULAR LENS PLACEMENT (IOC) LEFT  15.16 01:16.2;  Surgeon: Jaye Fallow, MD;  Location: [facility];  Service: Ophthalmology;  Laterality: Left;   COLONOSCOPY WITH PROPOFOL  N/A 05/27/2017   Procedure: COLONOSCOPY WITH PROPOFOL ;  Surgeon: Gaylyn Gladis PENNER, MD;  Location: [facility] ENDOSCOPY;  Service: Endoscopy;  Laterality: N/A;   COLONOSCOPY WITH PROPOFOL  N/A 07/02/2023   Procedure: COLONOSCOPY WITH PROPOFOL ;  Surgeon: Maryruth Ole DASEN, MD;  Location: [facility] ENDOSCOPY;  Service: Endoscopy;  Laterality: N/A;   DILATION AND CURETTAGE OF UTERUS  1973   after miscarriage   EYE SURGERY     LITHOTRIPSY     Family History  Problem Relation Age of Onset   Aortic stenosis Mother    Congestive Heart Failure Mother    Aortic stenosis Father    Coronary artery disease Father        Deceased from complications of atherosclerotic CAD and Atherosclerosis   Congestive Heart Failure Father    Parkinsonism Brother    Colon polyps Sister    Arthritis Maternal Grandfather    Early death Paternal Grandmother    Heart block Brother    Stroke Maternal Grandmother    Kidney disease Paternal Grandfather    Breast cancer Maternal Aunt 1   Ovarian cancer Maternal Aunt    Social History   Socioeconomic History   Marital status: Married    Spouse name: Richard   Number of children: 1   Years of education: HS   Highest education level: Associate degree: occupational, Scientist, product/process development, or vocational program  Occupational History   Occupation: Retired  Tobacco Use   Smoking status: Never   Smokeless tobacco: Never  Vaping Use   Vaping status: Never Used  Substance and Sexual Activity   Alcohol use: Yes    Alcohol/week: 2.0 standard drinks of alcohol    Types: 2 Glasses of wine per week    Comment: 1-2 glasses of wine weekly   Drug use: No   Sexual activity: Not Currently  Other Topics Concern   Not on file  Social History Narrative   Not on file   Social Drivers of Health   Financial Resource Strain: Low Risk   (01/21/2024)   Overall Financial Resource Strain (CARDIA)    Difficulty of Paying Living Expenses: Not hard at all  Food Insecurity: No Food Insecurity (01/21/2024)   Hunger Vital Sign    Worried About Running Out of Food in the Last Year: Never true    Ran Out of Food in the Last Year: Never true  Transportation Needs: No Transportation Needs (01/21/2024)   PRAPARE - Administrator, Civil Service (Medical): No    Lack of Transportation (Non-Medical): No  Physical Activity: Inactive (01/21/2024)   Exercise Vital Sign    Days of Exercise per Week: Patient declined    Minutes of Exercise per Session: 0 min  Stress: No Stress Concern Present (01/21/2024)   [employer] of Occupational Health -  Occupational Stress Questionnaire    Feeling of Stress: Only a little  Social Connections: Socially Integrated (01/21/2024)   Social Connection and Isolation Panel    Frequency of Communication with Friends and Family: More than three times a week    Frequency of Social Gatherings with Friends and Family: Three times a week    Attends Religious Services: More than 4 times per year    Active Member of Clubs or Organizations: Yes    Attends Engineer, structural: More than 4 times per year    Marital Status: Married    Tobacco Counseling Counseling given: Not Answered   Clinical Intake:  Pre-visit preparation completed: Yes  Pain : No/denies pain     BMI - recorded: 24.71 Nutritional Status: BMI of 19-24  Normal Nutritional Risks: None Diabetes: No  How often do you need to have someone help you when you read instructions, pamphlets, or other written materials from your doctor or pharmacy?: 1 - Never What is the last grade level you completed in school?: 1 year college  Interpreter Needed?: No  Information entered by :: Arnette Hoots, CMA   Activities of Daily Living    01/21/2024   11:27 AM  In your present state of health, do you have any  difficulty performing the following activities:  Hearing? 0  Vision? 0  Difficulty concentrating or making decisions? 0  Walking or climbing stairs? 0  Dressing or bathing? 0  Doing errands, shopping? 0  Preparing Food and eating ? N  Using the Toilet? N  In the past six months, have you accidently leaked urine? N  Do you have problems with loss of bowel control? N  Managing your Medications? N  Managing your Finances? N  Housekeeping or managing your Housekeeping? N    Patient Care Team: Glendia Shad, MD as PCP - General (Internal Medicine) Cherilyn Debby CROME, MD as Consulting Physician (Internal Medicine) Jaye Fallow, MD as Referring Physician (Ophthalmology)  Indicate any recent Medical Services you may have received from other than [facility] providers in the past year (date may be approximate).     Assessment:   This is a routine wellness examination for Beyza.  Hearing/Vision screen Hearing Screening - Comments:: No issues Vision Screening - Comments:: No issues-porfilio   Goals Addressed   None    Depression Screen    01/21/2024   11:30 AM 11/05/2023   10:15 AM 01/02/2023    9:54 AM 03/01/2022    1:28 PM 02/21/2022    1:50 PM 10/24/2021   10:35 AM 08/31/2021    8:47 AM  PHQ 2/9 Scores  PHQ - 2 Score 0 0 0 0 0 0 0  PHQ- 9 Score  1 0        Fall Risk    01/21/2024   11:29 AM 11/05/2023   10:10 AM 01/01/2023    2:01 PM 03/01/2022    1:28 PM 02/21/2022    1:50 PM  Fall Risk   Falls in the past year? 0 0 0 0 0  Number falls in past yr: 0 0 0 0 0  Injury with Fall? 0 0 0 0 0  Risk for fall due to : No Fall Risks No Fall Risks No Fall Risks No Fall Risks No Fall Risks  Follow up Falls evaluation completed;Education provided Falls evaluation completed Falls prevention discussed Falls evaluation completed  Falls evaluation completed      Data saved with a previous flowsheet row definition  MEDICARE RISK AT HOME: Medicare Risk at Home Any stairs in or  around the home?: No If so, are there any without handrails?: No Home free of loose throw rugs in walkways, pet beds, electrical cords, etc?: No Adequate lighting in your home to reduce risk of falls?: Yes Life alert?: No Use of a cane, walker or w/c?: No Grab bars in the bathroom?: Yes Shower chair or bench in shower?: No Elevated toilet seat or a handicapped toilet?: Yes  TIMED UP AND GO:  Was the test performed?  No    Cognitive Function:    08/20/2017    2:41 PM  MMSE - Mini Mental State Exam  Orientation to time 5  Orientation to Place 5  Registration 3  Attention/ Calculation 5  Recall 3  Language- name 2 objects 2  Language- repeat 1  Language- follow 3 step command 3  Language- read & follow direction 1  Write a sentence 1  Copy design 1  Total score 30        01/21/2024   11:30 AM 01/02/2023    9:58 AM 08/22/2018   10:48 AM  6CIT Screen  What Year? 0 points 0 points 0 points  What month? 0 points 0 points 0 points  What time? 0 points 0 points 0 points  Count back from 20 0 points 0 points 0 points  Months in reverse 0 points 0 points 0 points  Repeat phrase 2 points 0 points 0 points  Total Score 2 points 0 points 0 points    Immunizations Immunization History  Administered Date(s) Administered   Fluad Quad(high Dose 65+) 01/08/2022   INFLUENZA, HIGH DOSE SEASONAL PF 01/08/2019, 02/14/2023, 01/02/2024   Influenza Split 02/14/2012   Influenza-Unspecified 01/24/2017, 12/03/2017, 12/28/2019, 02/03/2021   Moderna Covid-19 Fall Seasonal Vaccine 28yrs & older 01/08/2022, 01/23/2023   PFIZER Comirnaty(Gray Top)Covid-19 Tri-Sucrose Vaccine 07/22/2020   PFIZER(Purple Top)SARS-COV-2 Vaccination 04/06/2019, 04/27/2019, 12/28/2019, 07/12/2020, 02/17/2021   Pneumococcal Conjugate-13 03/22/2014, 04/10/2017   Pneumococcal Polysaccharide-23 01/16/2011, 04/10/2017   Pneumococcal-Unspecified 01/16/2011   Respiratory Syncytial Virus Vaccine,Recomb Aduvanted(Arexvy)  01/25/2022   Tdap 03/22/2014   Zoster Recombinant(Shingrix) 08/21/2017, 11/11/2017   Zoster, Live 07/13/2010    TDAP status: Up to date  Flu Vaccine status: Up to date  Pneumococcal vaccine status: Up to date  Covid-19 vaccine status: Completed vaccines  Qualifies for Shingles Vaccine? Yes   Zostavax completed Yes   Shingrix Completed?: Yes  Screening Tests Health Maintenance  Topic Date Due   DEXA SCAN  04/26/2023   COVID-19 Vaccine (9 - 2025-26 season) 12/02/2023   Medicare Annual Wellness (AWV)  01/02/2024   DTaP/Tdap/Td (2 - Td or Tdap) 03/22/2024   Mammogram  12/25/2024   Pneumococcal Vaccine: 50+ Years  Completed   Influenza Vaccine  Completed   Hepatitis C Screening  Completed   Zoster Vaccines- Shingrix  Completed   Meningococcal B Vaccine  Aged Out   Colonoscopy  Discontinued    Health Maintenance  Health Maintenance Due  Topic Date Due   DEXA SCAN  04/26/2023   COVID-19 Vaccine (9 - 2025-26 season) 12/02/2023   Medicare Annual Wellness (AWV)  01/02/2024    Colorectal cancer screening: Type of screening: Colonoscopy. Completed 07/02/2023. Repeat every 10 years  Mammogram status: Completed 12/26/2023. Repeat every year  Bone Density status: Completed 04/25/2021. Results reflect: Bone density results: OSTEOPOROSIS. Repeat every 2 years.  Lung Cancer Screening: (Low Dose CT Chest recommended if Age 16-80 years, 20 pack-year currently smoking OR have quit  w/in 15years.) does not qualify.   Lung Cancer Screening Referral: n/a  Additional Screening:  Hepatitis C Screening: does not qualify; Completed n/a  Vision Screening: Recommended annual ophthalmology exams for early detection of glaucoma and other disorders of the eye. Is the patient up to date with their annual eye exam?  Yes  Who is the provider or what is the name of the office in which the patient attends annual eye exams? Dr jaye If pt is not established with a provider, would they like to be  referred to a provider to establish care? No .   Dental Screening: Recommended annual dental exams for proper oral hygiene    Community Resource Referral / Chronic Care Management: CRR required this visit?  No   CCM required this visit?  No     Plan:     I have personally reviewed and noted the following in the patient's chart:   Medical and social history Use of alcohol, tobacco or illicit drugs  Current medications and supplements including opioid prescriptions. Patient is not currently taking opioid prescriptions. Functional ability and status Nutritional status Physical activity Advanced directives List of other physicians Hospitalizations, surgeries, and ER visits in previous 12 months Vitals Screenings to include cognitive, depression, and falls Referrals and appointments  In addition, I have reviewed and discussed with patient certain preventive protocols, quality metrics, and best practice recommendations. A written personalized care plan for preventive services as well as general preventive health recommendations were provided to patient.     Arnette LOISE Hoots, CMA   01/21/2024   After Visit Summary: (Mail) Due to this being a telephonic visit, the after visit summary with patients personalized plan was offered to patient via mail   Nurse Notes: Patient is doing well and will get updated tdap at next trip.

## 2024-03-05 ENCOUNTER — Telehealth: Payer: Self-pay | Admitting: Internal Medicine

## 2024-03-05 DIAGNOSIS — E7800 Pure hypercholesterolemia, unspecified: Secondary | ICD-10-CM

## 2024-03-05 DIAGNOSIS — N1831 Chronic kidney disease, stage 3a: Secondary | ICD-10-CM

## 2024-03-05 NOTE — Telephone Encounter (Signed)
 Lab orders placed.

## 2024-03-05 NOTE — Telephone Encounter (Signed)
 Patient need lab orders.

## 2024-03-05 NOTE — Addendum Note (Signed)
 Addended by: BRIEN SHARENE RAMAN on: 03/05/2024 03:35 PM   Modules accepted: Orders

## 2024-03-09 ENCOUNTER — Other Ambulatory Visit

## 2024-03-09 ENCOUNTER — Ambulatory Visit: Payer: Self-pay | Admitting: Internal Medicine

## 2024-03-09 DIAGNOSIS — E7800 Pure hypercholesterolemia, unspecified: Secondary | ICD-10-CM

## 2024-03-09 DIAGNOSIS — N1831 Chronic kidney disease, stage 3a: Secondary | ICD-10-CM | POA: Diagnosis not present

## 2024-03-09 LAB — HEPATIC FUNCTION PANEL
ALT: 19 U/L (ref 0–35)
AST: 21 U/L (ref 0–37)
Albumin: 4.6 g/dL (ref 3.5–5.2)
Alkaline Phosphatase: 54 U/L (ref 39–117)
Bilirubin, Direct: 0.1 mg/dL (ref 0.0–0.3)
Total Bilirubin: 0.7 mg/dL (ref 0.2–1.2)
Total Protein: 7.5 g/dL (ref 6.0–8.3)

## 2024-03-09 LAB — LIPID PANEL
Cholesterol: 122 mg/dL (ref 0–200)
HDL: 50 mg/dL (ref >39.00)
LDL Cholesterol: 43 mg/dL (ref 0–99)
NonHDL: 72.09
Total CHOL/HDL Ratio: 2
Triglycerides: 147 mg/dL (ref 0.0–149.0)
VLDL: 29.4 mg/dL (ref 0.0–40.0)

## 2024-03-09 LAB — CBC WITH DIFFERENTIAL/PLATELET
Basophils Absolute: 0.1 K/uL (ref 0.0–0.1)
Basophils Relative: 1.1 % (ref 0.0–3.0)
Eosinophils Absolute: 0.6 K/uL (ref 0.0–0.7)
Eosinophils Relative: 8.8 % — ABNORMAL HIGH (ref 0.0–5.0)
HCT: 37.1 % (ref 36.0–46.0)
Hemoglobin: 12.5 g/dL (ref 12.0–15.0)
Lymphocytes Relative: 23.3 % (ref 12.0–46.0)
Lymphs Abs: 1.6 K/uL (ref 0.7–4.0)
MCHC: 33.7 g/dL (ref 30.0–36.0)
MCV: 93.3 fl (ref 78.0–100.0)
Monocytes Absolute: 0.7 K/uL (ref 0.1–1.0)
Monocytes Relative: 10.7 % (ref 3.0–12.0)
Neutro Abs: 3.9 K/uL (ref 1.4–7.7)
Neutrophils Relative %: 56.1 % (ref 43.0–77.0)
Platelets: 235 K/uL (ref 150.0–400.0)
RBC: 3.97 Mil/uL (ref 3.87–5.11)
RDW: 13.6 % (ref 11.5–15.5)
WBC: 6.9 K/uL (ref 4.0–10.5)

## 2024-03-09 LAB — TSH: TSH: 2.2 u[IU]/mL (ref 0.35–5.50)

## 2024-03-09 LAB — BASIC METABOLIC PANEL WITH GFR
BUN: 17 mg/dL (ref 6–23)
CO2: 28 meq/L (ref 19–32)
Calcium: 9.1 mg/dL (ref 8.4–10.5)
Chloride: 102 meq/L (ref 96–112)
Creatinine, Ser: 0.94 mg/dL (ref 0.40–1.20)
GFR: 57.72 mL/min — ABNORMAL LOW (ref >60.00)
Glucose, Bld: 103 mg/dL — ABNORMAL HIGH (ref 70–99)
Potassium: 4 meq/L (ref 3.5–5.1)
Sodium: 139 meq/L (ref 135–145)

## 2024-03-12 ENCOUNTER — Encounter: Payer: Self-pay | Admitting: Internal Medicine

## 2024-03-12 ENCOUNTER — Ambulatory Visit: Admitting: Internal Medicine

## 2024-03-12 VITALS — BP 120/70 | HR 68 | Temp 98.0°F | Ht 66.0 in | Wt 159.0 lb

## 2024-03-12 DIAGNOSIS — F419 Anxiety disorder, unspecified: Secondary | ICD-10-CM | POA: Diagnosis not present

## 2024-03-12 DIAGNOSIS — N1831 Chronic kidney disease, stage 3a: Secondary | ICD-10-CM

## 2024-03-12 DIAGNOSIS — D0511 Intraductal carcinoma in situ of right breast: Secondary | ICD-10-CM | POA: Diagnosis not present

## 2024-03-12 DIAGNOSIS — E2839 Other primary ovarian failure: Secondary | ICD-10-CM | POA: Diagnosis not present

## 2024-03-12 DIAGNOSIS — Z860100 Personal history of colon polyps, unspecified: Secondary | ICD-10-CM

## 2024-03-12 DIAGNOSIS — I471 Supraventricular tachycardia, unspecified: Secondary | ICD-10-CM

## 2024-03-12 DIAGNOSIS — M8580 Other specified disorders of bone density and structure, unspecified site: Secondary | ICD-10-CM

## 2024-03-12 DIAGNOSIS — E7800 Pure hypercholesterolemia, unspecified: Secondary | ICD-10-CM | POA: Diagnosis not present

## 2024-03-12 NOTE — Progress Notes (Unsigned)
 Subjective:    Patient ID: Claudia Little, female    DOB: 1946/03/15, 78 y.o.   MRN: [id number]  Patient here for  Chief Complaint  Patient presents with   Medical Management of Chronic Issues    HPI Here for a scheduled follow up - follow up regarding increased stress - on zoloft . Also f/u regarding hypercholesterolemia. Saw Dr Cherilyn - 09/25/23 - f/u osteoporosis. Recommended continuing reclast . Last infusion - 10/16/23. Continues on atenolol  - SVT - controlled. Overall doing well. No chest pain or sob reported. No abdominal pain or bowel change reported. Does report - persistent right upper arm and right shoulder pain. Aggravated if lies on her arm. Certain movements aggravate.    Past Medical History:  Diagnosis Date   Breast cancer (HCC) 12/2013   right breast cancer   Chronic anxiety    Dysrhythmia    SVT   History of kidney stones    Insomnia    Nephrolithiasis    Osteoporosis    PAC (premature atrial contraction)    Palpitations    Personal history of radiation therapy 2015   Right breast   Stage 3a chronic kidney disease (HCC)    SVT (supraventricular tachycardia)    Vertigo    pre-2019.  none since   Past Surgical History:  Procedure Laterality Date   BREAST CYST ASPIRATION Left 10+ yrs ago   neg   BREAST EXCISIONAL BIOPSY Right 12/15/2013   DCIS   BREAST LUMPECTOMY     BREAST SURGERY Right 12/15/2013   lumpectomy   BUNIONECTOMY  early '70s   CATARACT EXTRACTION W/PHACO Left 11/19/2022   Procedure: CATARACT EXTRACTION PHACO AND INTRAOCULAR LENS PLACEMENT (IOC) LEFT 15.16 01:16.2;  Surgeon: Jaye Fallow, MD;  Location: [facility];  Service: Ophthalmology;  Laterality: Left;   COLONOSCOPY WITH PROPOFOL  N/A 05/27/2017   Procedure: COLONOSCOPY WITH PROPOFOL ;  Surgeon: Gaylyn Gladis PENNER, MD;  Location: [facility] ENDOSCOPY;  Service: Endoscopy;  Laterality: N/A;   COLONOSCOPY WITH PROPOFOL  N/A 07/02/2023   Procedure: COLONOSCOPY WITH PROPOFOL ;  Surgeon:  Maryruth Ole DASEN, MD;  Location: [facility] ENDOSCOPY;  Service: Endoscopy;  Laterality: N/A;   DILATION AND CURETTAGE OF UTERUS  1973   after miscarriage   EYE SURGERY     LITHOTRIPSY     Family History  Problem Relation Age of Onset   Aortic stenosis Mother    Congestive Heart Failure Mother    Aortic stenosis Father    Coronary artery disease Father        Deceased from complications of atherosclerotic CAD and Atherosclerosis   Congestive Heart Failure Father    Parkinsonism Brother    Colon polyps Sister    Arthritis Maternal Grandfather    Early death Paternal Grandmother    Heart block Brother    Stroke Maternal Grandmother    Kidney disease Paternal Grandfather    Breast cancer Maternal Aunt 63   Ovarian cancer Maternal Aunt    Social History   Socioeconomic History   Marital status: Married    Spouse name: Richard   Number of children: 1   Years of education: HS   Highest education level: Associate degree: occupational, scientist, product/process development, or vocational program  Occupational History   Occupation: Retired  Tobacco Use   Smoking status: Never   Smokeless tobacco: Never  Vaping Use   Vaping status: Never Used  Substance and Sexual Activity   Alcohol use: Yes    Alcohol/week: 2.0 standard drinks of alcohol  Types: 2 Glasses of wine per week    Comment: 1-2 glasses of wine weekly   Drug use: No   Sexual activity: Not Currently  Other Topics Concern   Not on file  Social History Narrative   Not on file   Social Drivers of Health   Tobacco Use: Low Risk (03/12/2024)   Patient History    Smoking Tobacco Use: Never    Smokeless Tobacco Use: Never    Passive Exposure: Not on file  Financial Resource Strain: Low Risk (01/21/2024)   Overall Financial Resource Strain (CARDIA)    Difficulty of Paying Living Expenses: Not hard at all  Food Insecurity: No Food Insecurity (01/21/2024)   Epic    Worried About Radiation Protection Practitioner of Food in the Last Year: Never true    Ran Out of  Food in the Last Year: Never true  Transportation Needs: No Transportation Needs (01/21/2024)   Epic    Lack of Transportation (Medical): No    Lack of Transportation (Non-Medical): No  Physical Activity: Inactive (01/21/2024)   Exercise Vital Sign    Days of Exercise per Week: Patient declined    Minutes of Exercise per Session: 0 min  Stress: No Stress Concern Present (01/21/2024)   [employer] of Occupational Health - Occupational Stress Questionnaire    Feeling of Stress: Only a little  Social Connections: Socially Integrated (01/21/2024)   Social Connection and Isolation Panel    Frequency of Communication with Friends and Family: More than three times a week    Frequency of Social Gatherings with Friends and Family: Three times a week    Attends Religious Services: More than 4 times per year    Active Member of Clubs or Organizations: Yes    Attends Banker Meetings: More than 4 times per year    Marital Status: Married  Depression (PHQ2-9): Low Risk (03/12/2024)   Depression (PHQ2-9)    PHQ-2 Score: 1  Alcohol Screen: Low Risk (07/01/2023)   Alcohol Screen    Last Alcohol Screening Score (AUDIT): 3  Housing: Low Risk (01/21/2024)   Epic    Unable to Pay for Housing in the Last Year: No    Number of Times Moved in the Last Year: 0    Homeless in the Last Year: No  Utilities: Not At Risk (01/21/2024)   Epic    Threatened with loss of utilities: No  Health Literacy: Adequate Health Literacy (01/21/2024)   B1300 Health Literacy    Frequency of need for help with medical instructions: Never     Review of Systems     Objective:     BP 120/70   Pulse 68   Temp 98 F (36.7 C) (Oral)   Ht 5' 6 (1.676 m)   Wt 159 lb (72.1 kg)   SpO2 97%   BMI 25.66 kg/m  Wt Readings from Last 3 Encounters:  03/12/24 159 lb (72.1 kg)  01/21/24 153 lb (69.4 kg)  11/05/23 153 lb (69.4 kg)    Physical Exam  {Perform Simple Foot Exam  Perform Detailed  exam:1} {Insert foot Exam (Optional):30965}   Outpatient Encounter Medications as of 03/12/2024  Medication Sig   Cholecalciferol  25 MCG (1000 UT) tablet Take 4,000 Units by mouth daily.   Coenzyme Q10 100 MG TABS Take 1 tablet (100 mg total) by mouth daily.   cyanocobalamin  1000 MCG tablet Take by mouth.   zoledronic  acid (RECLAST ) 5 MG/100ML SOLN injection Inject 5 mg into the vein once.  atenolol  (TENORMIN ) 25 MG tablet Take 1 tablet (25 mg total) by mouth daily.   rosuvastatin  (CRESTOR ) 20 MG tablet Take 1 tablet (20 mg total) by mouth daily.   sertraline  (ZOLOFT ) 50 MG tablet Take 1 tablet (50 mg total) by mouth daily.   [DISCONTINUED] atenolol  (TENORMIN ) 25 MG tablet Take 1 tablet (25 mg total) by mouth daily.   [DISCONTINUED] rosuvastatin  (CRESTOR ) 20 MG tablet TAKE 1 TABLET BY MOUTH DAILY   [DISCONTINUED] sertraline  (ZOLOFT ) 50 MG tablet TAKE 1 TABLET BY MOUTH DAILY   No facility-administered encounter medications on file as of 03/12/2024.     Lab Results  Component Value Date   WBC 6.9 03/09/2024   HGB 12.5 03/09/2024   HCT 37.1 03/09/2024   PLT 235.0 03/09/2024   GLUCOSE 103 (H) 03/09/2024   CHOL 122 03/09/2024   TRIG 147.0 03/09/2024   HDL 50.00 03/09/2024   LDLDIRECT 96.0 09/13/2020   LDLCALC 43 03/09/2024   ALT 19 03/09/2024   AST 21 03/09/2024   NA 139 03/09/2024   K 4.0 03/09/2024   CL 102 03/09/2024   CREATININE 0.94 03/09/2024   BUN 17 03/09/2024   CO2 28 03/09/2024   TSH 2.20 03/09/2024   HGBA1C 5.3 01/09/2019    MM 3D SCREENING MAMMOGRAM BILATERAL BREAST Result Date: 12/30/2023 CLINICAL DATA:  Screening. EXAM: DIGITAL SCREENING BILATERAL MAMMOGRAM WITH TOMOSYNTHESIS AND CAD TECHNIQUE: Bilateral screening digital craniocaudal and mediolateral oblique mammograms were obtained. Bilateral screening digital breast tomosynthesis was performed. The images were evaluated with computer-aided detection. COMPARISON:  Previous exam(s). ACR Breast Density Category  c: The breasts are heterogeneously dense, which may obscure small masses. FINDINGS: There are no findings suspicious for malignancy. IMPRESSION: No mammographic evidence of malignancy. A result letter of this screening mammogram will be mailed directly to the patient. RECOMMENDATION: Screening mammogram in one year. (Code:SM-B-01Y) BI-RADS CATEGORY  1: Negative. Electronically Signed   By: Alm Parkins M.D.   On: 12/30/2023 12:36       Assessment & Plan:  Estrogen deficiency  Stage 3a chronic kidney disease (HCC) -     Basic metabolic panel with GFR; Future  Hypercholesteremia -     Lipid panel; Future -     Hepatic function panel; Future -     CBC with Differential/Platelet; Future  Other orders -     Atenolol ; Take 1 tablet (25 mg total) by mouth daily.  Dispense: 90 tablet; Refill: 1 -     Rosuvastatin  Calcium ; Take 1 tablet (20 mg total) by mouth daily.  Dispense: 90 tablet; Refill: 1 -     Sertraline  HCl; Take 1 tablet (50 mg total) by mouth daily.  Dispense: 90 tablet; Refill: 1     Allena Hamilton, MD

## 2024-03-22 ENCOUNTER — Encounter: Payer: Self-pay | Admitting: Internal Medicine

## 2024-03-22 DIAGNOSIS — M25519 Pain in unspecified shoulder: Secondary | ICD-10-CM | POA: Insufficient documentation

## 2024-03-22 NOTE — Assessment & Plan Note (Deleted)
 Notify if desires further intervention. Gentle stretches.

## 2024-03-22 NOTE — Assessment & Plan Note (Signed)
 Saw Dr Cherilyn - 09/25/23 - f/u osteoporosis. Recommended continuing reclast . Last infusion - 10/16/23.

## 2024-03-22 NOTE — Assessment & Plan Note (Signed)
 S/p lumpectomy - right breast.  S/p XRT. ER/PR positive.  Mammogram 12/21/2020 - [facility]ads II.   Recent right breast pain - resolved.  Right breast mammogram and ultrasound 07/11/21 - Birads I.  Screening bilateral mammogram - 12/18/21 - [facility]ads I.  Mammogram 12/20/22 - Birads I.  Mammogram 12/30/23 - Birads I.

## 2024-03-22 NOTE — Assessment & Plan Note (Signed)
 Tolerating Crestor .  Continue low-cholesterol diet and exercise.  Follow-up lipid panel liver function test. No change in medication today.  Lab Results  Component Value Date   CHOL 122 03/09/2024   HDL 50.00 03/09/2024   LDLCALC 43 03/09/2024   LDLDIRECT 96.0 09/13/2020   TRIG 147.0 03/09/2024   CHOLHDL 2 03/09/2024

## 2024-03-22 NOTE — Assessment & Plan Note (Signed)
 Has done well on atenolol .  Continue current dose.  Follow. Stable. No change in medication today.

## 2024-03-22 NOTE — Assessment & Plan Note (Addendum)
 SABRA

## 2024-03-22 NOTE — Assessment & Plan Note (Signed)
 Continue zoloft.  Stable.

## 2024-03-22 NOTE — Assessment & Plan Note (Signed)
 Avoid antiinflammatories.  Stay hydrated.  GFR recent check 57. Improved. Follow metabolic panel.

## 2024-03-22 NOTE — Assessment & Plan Note (Signed)
 Colonoscopy 07/02/23 - internal hemorrhoids. No repeat.

## 2024-07-13 ENCOUNTER — Other Ambulatory Visit

## 2024-07-15 ENCOUNTER — Ambulatory Visit: Admitting: Internal Medicine
